# Patient Record
Sex: Male | Born: 1940
Health system: Southern US, Community
[De-identification: ages and names within clinical notes are randomized; demographics above are authoritative.]

## PROBLEM LIST (undated history)

## (undated) DIAGNOSIS — R0602 Shortness of breath: Secondary | ICD-10-CM

## (undated) DIAGNOSIS — I739 Peripheral vascular disease, unspecified: Secondary | ICD-10-CM

## (undated) DIAGNOSIS — E538 Deficiency of other specified B group vitamins: Secondary | ICD-10-CM

## (undated) DIAGNOSIS — I779 Disorder of arteries and arterioles, unspecified: Secondary | ICD-10-CM

## (undated) DIAGNOSIS — D126 Benign neoplasm of colon, unspecified: Secondary | ICD-10-CM

## (undated) DIAGNOSIS — B070 Plantar wart: Secondary | ICD-10-CM

## (undated) DIAGNOSIS — I493 Ventricular premature depolarization: Secondary | ICD-10-CM

## (undated) DIAGNOSIS — E785 Hyperlipidemia, unspecified: Secondary | ICD-10-CM

## (undated) DIAGNOSIS — K760 Fatty (change of) liver, not elsewhere classified: Secondary | ICD-10-CM

## (undated) DIAGNOSIS — N400 Enlarged prostate without lower urinary tract symptoms: Secondary | ICD-10-CM

## (undated) DIAGNOSIS — I77811 Abdominal aortic ectasia: Secondary | ICD-10-CM

## (undated) DIAGNOSIS — Z8639 Personal history of other endocrine, nutritional and metabolic disease: Secondary | ICD-10-CM

## (undated) DIAGNOSIS — N183 Chronic kidney disease, stage 3 unspecified: Secondary | ICD-10-CM

## (undated) DIAGNOSIS — G250 Essential tremor: Secondary | ICD-10-CM

## (undated) DIAGNOSIS — I252 Old myocardial infarction: Secondary | ICD-10-CM

## (undated) DIAGNOSIS — F17200 Nicotine dependence, unspecified, uncomplicated: Secondary | ICD-10-CM

## (undated) DIAGNOSIS — I6529 Occlusion and stenosis of unspecified carotid artery: Secondary | ICD-10-CM

## (undated) DIAGNOSIS — M5136 Other intervertebral disc degeneration, lumbar region: Secondary | ICD-10-CM

## (undated) DIAGNOSIS — I10 Essential (primary) hypertension: Secondary | ICD-10-CM

## (undated) DIAGNOSIS — R7303 Prediabetes: Secondary | ICD-10-CM

## (undated) DIAGNOSIS — I351 Nonrheumatic aortic (valve) insufficiency: Secondary | ICD-10-CM

## (undated) DIAGNOSIS — R911 Solitary pulmonary nodule: Secondary | ICD-10-CM

## (undated) DIAGNOSIS — N182 Chronic kidney disease, stage 2 (mild): Secondary | ICD-10-CM

## (undated) HISTORY — PX: APPENDECTOMY: SHX54

## (undated) HISTORY — PX: TONSILLECTOMY: SUR1361

## (undated) HISTORY — DX: Ventricular premature depolarization: I49.3

## (undated) HISTORY — DX: Chronic kidney disease, stage 2 (mild): N18.2

## (undated) HISTORY — DX: Benign prostatic hyperplasia without lower urinary tract symptoms: N40.0

## (undated) HISTORY — DX: Plantar wart: B07.0

## (undated) HISTORY — PX: UMBILICAL HERNIA REPAIR: SHX196

## (undated) HISTORY — DX: Chronic kidney disease, stage 3 unspecified: N18.30

## (undated) HISTORY — DX: Hyperlipidemia, unspecified: E78.5

## (undated) HISTORY — DX: Disorder of arteries and arterioles, unspecified: I77.9

## (undated) HISTORY — DX: Benign neoplasm of colon, unspecified: D12.6

## (undated) HISTORY — DX: Nonrheumatic aortic (valve) insufficiency: I35.1

## (undated) HISTORY — DX: Deficiency of other specified B group vitamins: E53.8

## (undated) HISTORY — PX: TONSILLECTOMY: SHX5217

## (undated) HISTORY — DX: Essential tremor: G25.0

## (undated) HISTORY — DX: Other intervertebral disc degeneration, lumbar region: M51.36

## (undated) HISTORY — DX: Occlusion and stenosis of unspecified carotid artery: I65.29

## (undated) HISTORY — DX: Fatty (change of) liver, not elsewhere classified: K76.0

## (undated) HISTORY — DX: Solitary pulmonary nodule: R91.1

## (undated) HISTORY — PX: OTHER SURGICAL HISTORY: SHX169

## (undated) HISTORY — DX: Abdominal aortic ectasia: I77.811

## (undated) HISTORY — DX: Peripheral vascular disease, unspecified: I73.9

## (undated) HISTORY — DX: Personal history of other endocrine, nutritional and metabolic disease: Z86.39

## (undated) HISTORY — DX: Old myocardial infarction: I25.2

## (undated) HISTORY — DX: Shortness of breath: R06.02

## (undated) HISTORY — DX: Nicotine dependence, unspecified, uncomplicated: F17.200

## (undated) HISTORY — DX: Essential (primary) hypertension: I10

## (undated) HISTORY — DX: Prediabetes: R73.03

## (undated) HISTORY — PX: COLONOSCOPY W/ POLYPECTOMY: SHX1380

---

## 2002-02-20 ENCOUNTER — Encounter: Payer: Self-pay | Admitting: Family Medicine

## 2002-02-20 ENCOUNTER — Ambulatory Visit (HOSPITAL_COMMUNITY): Admission: RE | Admit: 2002-02-20 | Discharge: 2002-02-20 | Payer: Self-pay | Admitting: Family Medicine

## 2002-04-11 ENCOUNTER — Ambulatory Visit (HOSPITAL_COMMUNITY): Admission: RE | Admit: 2002-04-11 | Discharge: 2002-04-11 | Payer: Self-pay | Admitting: Internal Medicine

## 2002-05-20 ENCOUNTER — Ambulatory Visit (HOSPITAL_COMMUNITY): Admission: RE | Admit: 2002-05-20 | Discharge: 2002-05-20 | Payer: Self-pay | Admitting: Cardiovascular Disease

## 2002-10-31 ENCOUNTER — Emergency Department (HOSPITAL_COMMUNITY): Admission: EM | Admit: 2002-10-31 | Discharge: 2002-11-01 | Payer: Self-pay | Admitting: *Deleted

## 2003-06-01 ENCOUNTER — Ambulatory Visit (HOSPITAL_COMMUNITY): Admission: RE | Admit: 2003-06-01 | Discharge: 2003-06-01 | Payer: Self-pay | Admitting: Internal Medicine

## 2003-08-17 ENCOUNTER — Ambulatory Visit (HOSPITAL_COMMUNITY): Admission: RE | Admit: 2003-08-17 | Discharge: 2003-08-17 | Payer: Self-pay | Admitting: General Surgery

## 2004-09-11 DIAGNOSIS — K760 Fatty (change of) liver, not elsewhere classified: Secondary | ICD-10-CM

## 2004-09-11 HISTORY — DX: Fatty (change of) liver, not elsewhere classified: K76.0

## 2004-09-19 ENCOUNTER — Emergency Department (HOSPITAL_COMMUNITY): Admission: EM | Admit: 2004-09-19 | Discharge: 2004-09-19 | Payer: Self-pay | Admitting: Emergency Medicine

## 2004-11-18 ENCOUNTER — Ambulatory Visit (HOSPITAL_COMMUNITY): Admission: RE | Admit: 2004-11-18 | Discharge: 2004-11-18 | Payer: Self-pay | Admitting: Family Medicine

## 2005-10-24 ENCOUNTER — Ambulatory Visit (HOSPITAL_COMMUNITY): Admission: RE | Admit: 2005-10-24 | Discharge: 2005-10-24 | Payer: Self-pay | Admitting: Urology

## 2006-07-31 ENCOUNTER — Ambulatory Visit (HOSPITAL_COMMUNITY): Admission: RE | Admit: 2006-07-31 | Discharge: 2006-07-31 | Payer: Self-pay | Admitting: Internal Medicine

## 2006-07-31 ENCOUNTER — Ambulatory Visit: Payer: Self-pay | Admitting: Internal Medicine

## 2006-07-31 ENCOUNTER — Encounter (INDEPENDENT_AMBULATORY_CARE_PROVIDER_SITE_OTHER): Payer: Self-pay | Admitting: *Deleted

## 2006-07-31 LAB — HM COLONOSCOPY

## 2007-02-01 ENCOUNTER — Ambulatory Visit (HOSPITAL_COMMUNITY): Admission: RE | Admit: 2007-02-01 | Discharge: 2007-02-01 | Payer: Self-pay | Admitting: Family Medicine

## 2008-11-25 ENCOUNTER — Ambulatory Visit (HOSPITAL_COMMUNITY): Admission: RE | Admit: 2008-11-25 | Discharge: 2008-11-25 | Payer: Self-pay | Admitting: Family Medicine

## 2008-11-25 HISTORY — PX: CAROTID ENDARTERECTOMY: SUR193

## 2008-12-15 ENCOUNTER — Ambulatory Visit: Payer: Self-pay | Admitting: Vascular Surgery

## 2008-12-21 ENCOUNTER — Ambulatory Visit: Payer: Self-pay | Admitting: Vascular Surgery

## 2008-12-23 ENCOUNTER — Encounter: Payer: Self-pay | Admitting: Vascular Surgery

## 2008-12-23 ENCOUNTER — Ambulatory Visit: Payer: Self-pay | Admitting: Vascular Surgery

## 2008-12-23 ENCOUNTER — Inpatient Hospital Stay (HOSPITAL_COMMUNITY): Admission: AD | Admit: 2008-12-23 | Discharge: 2008-12-24 | Payer: Self-pay | Admitting: Vascular Surgery

## 2009-01-12 ENCOUNTER — Ambulatory Visit: Payer: Self-pay | Admitting: Vascular Surgery

## 2009-02-22 ENCOUNTER — Ambulatory Visit (HOSPITAL_COMMUNITY): Admission: RE | Admit: 2009-02-22 | Discharge: 2009-02-22 | Payer: Self-pay | Admitting: Family Medicine

## 2009-03-11 HISTORY — PX: OTHER SURGICAL HISTORY: SHX169

## 2009-08-03 ENCOUNTER — Ambulatory Visit: Payer: Self-pay | Admitting: Vascular Surgery

## 2009-11-26 ENCOUNTER — Ambulatory Visit (HOSPITAL_COMMUNITY): Admission: RE | Admit: 2009-11-26 | Discharge: 2009-11-26 | Payer: Self-pay | Admitting: General Surgery

## 2010-02-08 ENCOUNTER — Ambulatory Visit: Payer: Self-pay | Admitting: Vascular Surgery

## 2010-08-26 ENCOUNTER — Ambulatory Visit (HOSPITAL_COMMUNITY)
Admission: RE | Admit: 2010-08-26 | Discharge: 2010-08-26 | Payer: Self-pay | Source: Home / Self Care | Attending: Family Medicine | Admitting: Family Medicine

## 2010-12-04 LAB — CBC
MCHC: 34.4 g/dL (ref 30.0–36.0)
Platelets: 228 10*3/uL (ref 150–400)
RBC: 5.08 MIL/uL (ref 4.22–5.81)
RDW: 14.2 % (ref 11.5–15.5)

## 2010-12-04 LAB — BASIC METABOLIC PANEL
BUN: 19 mg/dL (ref 6–23)
CO2: 30 mEq/L (ref 19–32)
Calcium: 9.7 mg/dL (ref 8.4–10.5)
Creatinine, Ser: 1.29 mg/dL (ref 0.4–1.5)
GFR calc Af Amer: >60 mL/min (ref >60)
Glucose, Bld: 90 mg/dL (ref 70–99)

## 2010-12-21 LAB — URINALYSIS, ROUTINE W REFLEX MICROSCOPIC
Bilirubin Urine: NEGATIVE
Glucose, UA: NEGATIVE mg/dL
Ketones, ur: NEGATIVE mg/dL
Specific Gravity, Urine: 1.006 (ref 1.005–1.030)
pH: 7.5 (ref 5.0–8.0)

## 2010-12-21 LAB — BASIC METABOLIC PANEL
BUN: 10 mg/dL (ref 6–23)
Calcium: 9.5 mg/dL (ref 8.4–10.5)
Chloride: 99 mEq/L (ref 96–112)
Creatinine, Ser: 1.22 mg/dL (ref 0.4–1.5)
GFR calc Af Amer: >60 mL/min (ref >60)
GFR calc non Af Amer: 60 mL/min — ABNORMAL LOW (ref >60)

## 2010-12-21 LAB — CROSSMATCH: Antibody Screen: NEGATIVE

## 2010-12-21 LAB — CBC
MCV: 88.6 fL (ref 78.0–100.0)
Platelets: 201 10*3/uL (ref 150–400)
RBC: 4.46 MIL/uL (ref 4.22–5.81)
RBC: 4.98 MIL/uL (ref 4.22–5.81)
WBC: 16.2 10*3/uL — ABNORMAL HIGH (ref 4.0–10.5)
WBC: 7.8 10*3/uL (ref 4.0–10.5)

## 2010-12-21 LAB — COMPREHENSIVE METABOLIC PANEL
ALT: 15 U/L (ref 0–53)
AST: 21 U/L (ref 0–37)
Albumin: 3.8 g/dL (ref 3.5–5.2)
CO2: 29 mEq/L (ref 19–32)
Chloride: 102 mEq/L (ref 96–112)
GFR calc Af Amer: >60 mL/min (ref >60)
GFR calc non Af Amer: >60 mL/min (ref >60)
Sodium: 137 mEq/L (ref 135–145)
Total Bilirubin: 0.6 mg/dL (ref 0.3–1.2)

## 2010-12-21 LAB — ABO/RH: ABO/RH(D): O POS

## 2010-12-22 LAB — CREATININE, SERUM
Creatinine, Ser: 1.14 mg/dL (ref 0.4–1.5)
GFR calc Af Amer: >60 mL/min (ref >60)
GFR calc non Af Amer: >60 mL/min (ref >60)

## 2011-01-02 ENCOUNTER — Other Ambulatory Visit (HOSPITAL_COMMUNITY): Payer: Self-pay | Admitting: Family Medicine

## 2011-01-02 ENCOUNTER — Ambulatory Visit (HOSPITAL_COMMUNITY)
Admission: RE | Admit: 2011-01-02 | Discharge: 2011-01-02 | Disposition: A | Discharge status: Home / Self Care | Payer: Medicare Other | Source: Ambulatory Visit | Attending: Family Medicine | Admitting: Family Medicine

## 2011-01-02 DIAGNOSIS — M549 Dorsalgia, unspecified: Secondary | ICD-10-CM | POA: Insufficient documentation

## 2011-01-02 DIAGNOSIS — R1900 Intra-abdominal and pelvic swelling, mass and lump, unspecified site: Secondary | ICD-10-CM | POA: Insufficient documentation

## 2011-01-24 ENCOUNTER — Other Ambulatory Visit (INDEPENDENT_AMBULATORY_CARE_PROVIDER_SITE_OTHER): Payer: Medicare Other

## 2011-01-24 DIAGNOSIS — I6529 Occlusion and stenosis of unspecified carotid artery: Secondary | ICD-10-CM

## 2011-01-24 DIAGNOSIS — Z48812 Encounter for surgical aftercare following surgery on the circulatory system: Secondary | ICD-10-CM

## 2011-01-24 NOTE — Procedures (Signed)
CAROTID DUPLEX EXAM   INDICATION:  Follow up known carotid artery disease.   HISTORY:  Diabetes:  No.  Cardiac:  No.  Hypertension:  Yes.  Smoking:  Yes.  Previous Surgery:  CV History:  Amaurosis Fugax No, Paresthesias No, Hemiparesis No.                                       RIGHT             LEFT  Brachial systolic pressure:         130               120  Brachial Doppler waveforms:         Biphasic          Biphasic  Vertebral direction of flow:        Antegrade         Atypical  DUPLEX VELOCITIES (cm/sec)  CCA peak systolic                   132               113  ECA peak systolic                   171               198  ICA peak systolic                   144               499  ICA end diastolic                   41                169  PLAQUE MORPHOLOGY:                  Heterogenous      Heterogenous  PLAQUE AMOUNT:                      Moderate          Severe  PLAQUE LOCATION:                    ICA, ECA          ICA, ECA   IMPRESSION:  1. 80-99% stenosis noted in the left internal carotid artery.  2. 40-59% stenosis noted in the right internal carotid artery.  3. Antegrade right vertebral artery.  4. Left vertebral artery seemed to have atypical waveform.   ___________________________________________  Quita Skye Hart Rochester, M.D.   MG/MEDQ  D:  12/15/2008  T:  12/15/2008  Job:  [id number]

## 2011-01-24 NOTE — H&P (Signed)
HISTORY AND PHYSICAL EXAMINATION   December 21, 2008   Re:  Jeffrey [name], Jeffrey [name]                 DOB:  01-Mar-1941   CHIEF COMPLAINT:  Severe left internal carotid stenosis - asymptomatic.   HISTORY OF PRESENT ILLNESS:  A 67-year male patient was found have a  carotid bruit by Dr. Nobie Putnam and carotid duplex exam at [facility] in March of this year revealed an 80%+ left internal carotid  stenosis.  Repeat study in [facility] office on April 12 revealed a 90-95% left  internal carotid stenosis and 40-50% right internal carotid stenosis.  He has no history of stroke, TIAs, amaurosis fugax, diplopia, blurred  vision, syncope.  He is now scheduled for elective left carotid  endarterectomy.   PAST MEDICAL HISTORY:  1. Hypertension.  2. Negative for diabetes, coronary artery disease, COPD, stroke or      hyperlipidemia.   PAST SURGERIES:  Appendectomy.  The patient does have a knot in his left  upper arm which will be evaluated with an open biopsy at [facility] in the near future.   FAMILY HISTORY:  Positive for coronary artery disease, stroke and  diabetes in his mother.   SOCIAL HISTORY:  Married, has two children and is retired.  He has a 50+  pack year history of smoking one pack per day.  Does not use alcohol.   REVIEW OF SYSTEMS:  Negative for chest pain, dyspnea on exertion, PND,  orthopnea, anorexia, weight loss.  No cardiac, pulmonary symptoms.  Does  have urinary frequency and occasional headaches, otherwise unremarkable.   ALLERGIES:  None known.   MEDICATIONS:  1. Hydrochlorothiazide 25 mg daily.  2. Lotrel 10-20 mg daily.  3. Flomax 0.4 mg daily.  4. Prilosec 20 mg daily.  5. Goody Powder p.r.n.   PHYSICAL EXAM:  Vital signs:  Blood pressure is 150/96, heart rate 72,  respirations 14.  General: A middle-aged male in no apparent distress, alert and oriented  x3.  Neck:  Supple, 3+ carotid pulses palpable.  There are audible bruits  bilaterally left more harsh than the right.  Neurologic:  Exam normal.  No palpable adenopathy in the neck.  Chest:  Clear to auscultation.  Cardiovascular:  Regular rhythm.  No murmurs.  Abdomen:  Soft, nontender with no masses.  Femoral pulses are 3+ and  popliteal pulses are 3+, posterior tibial pulses 3+ bilaterally.   IMPRESSIONS:  1. Severe left internal carotid stenosis - asymptomatic.  2. Hypertension.   PLAN:  Is to admit the patient on April 14 for elective left carotid  endarterectomy.  Risks and benefits have been thoroughly discussed.  The  patient would like to proceed.   Quita Skye Hart Rochester, M.D.  Electronically Signed   JDL/MEDQ  D:  12/21/2008  T:  12/22/2008  Job:  [id number]   cc:   Patrica Duel, M.D.

## 2011-01-24 NOTE — Procedures (Signed)
CAROTID DUPLEX EXAM   INDICATION:  Followup left carotid endarterectomy.   HISTORY:  Diabetes:  no  Cardiac:  no  Hypertension:  yes  Smoking:  yes  Previous Surgery:  Left carotid endarterectomy on 12/23/2008  CV History:  Currently asymptomatic  Amaurosis Fugax No, Paresthesias No, Hemiparesis No                                       RIGHT             LEFT  Brachial systolic pressure:         138               112  Brachial Doppler waveforms:         normal            abnormal  Vertebral direction of flow:        antegrade         bidirectional  DUPLEX VELOCITIES (cm/sec)  CCA peak systolic                   115               119  ECA peak systolic                   116               118  ICA peak systolic                   92                102  ICA end diastolic                   23                28  PLAQUE MORPHOLOGY:                  mixed             homogenous  PLAQUE AMOUNT:                      mild              mild  PLAQUE LOCATION:                    ICA / ECA         Distal CCA   IMPRESSION:  1. 1% to 39% stenosis of the right internal carotid artery.  2. Patent left carotid endarterectomy site with no left internal      carotid artery stenosis.  3. Doppler velocities of the left internal carotid artery are less      than previously recorded when compared to the previous exam on      08/03/2009 with the right internal carotid artery remaining stable.   ___________________________________________  Quita Skye. Hart Rochester, M.D.   CH/MEDQ  D:  02/08/2010  T:  02/08/2010  Job:  [id number]

## 2011-01-24 NOTE — Op Note (Signed)
NAME:  Jeffrey [name], Jeffrey [name] NO.:  [id number]   MEDICAL RECORD NO.:  [id number]          PATIENT TYPE:  INP   LOCATION:  3311                         FACILITY:  [facility]   PHYSICIAN:  Quita Skye. Hart Rochester, M.D.  DATE OF BIRTH:  [birth year]   DATE OF PROCEDURE:  12/23/2008  DATE OF DISCHARGE:                               OPERATIVE REPORT   PREOPERATIVE DIAGNOSIS:  Severe left internal carotid stenosis -  asymptomatic.   POSTOPERATIVE DIAGNOSIS:  Severe left internal carotid stenosis -  asymptomatic.   OPERATION:  Left carotid endarterectomy with Dacron patch angioplasty.   SURGEON:  Quita Skye. Hart Rochester, MD   FIRST ASSISTANT:  Jerold Coombe, PA   ANESTHESIA:  General endotracheal.   BRIEF HISTORY:  This patient was found to have a carotid bruit by Dr.  Nobie Putnam and a carotid duplex exam reveals a 90% left internal carotid  stenosis and 40-50% right internal carotid stenosis.  He had no symptoms  of previous stroke or TIAs and was scheduled for an elective left  carotid endarterectomy.   PROCEDURE:  The patient was taken to the operating room and placed in  the supine position at which time satisfactory general endotracheal  anesthesia was administered.  Left neck was prepped with Betadine scrub  and solution and draped in routine sterile manner.  An incision was made  along the anterior border of the sternocleidomastoid muscle and carried  down through subcutaneous tissue and platysma using Bovie.  Common  facial vein and external jugular veins were ligated with 3-0 silk ties,  divided, exposing the common internal and external carotid arteries.  Care was taken not to injure the vagus or hypoglossal nerves, both of  which were exposed.  There was a calcified atherosclerotic plaque at the  carotid bifurcation extending up the internal carotid artery posteriorly  about 4 cm.  Distal vessel appeared normal.  A #10 shunt was prepared  and the patient was heparinized.   Carotid vessels were occluded with  vascular clamps.  A longitudinal opening made in the common carotid with  15 blade, extended up into internal carotid with Potts scissors to a  point distal to the disease.  Plaque was about 80-90% stenotic in  severity with excellent backbleeding.  A #10 shunt was inserted without  difficulty reestablishing flow in about 2 minutes.  Standard  endarterectomy was then performed using the elevator and Potts scissors  with eversion endarterectomy of the external carotid.  The plaque  feathered off distal internal carotid artery nicely, not requiring any  tacking sutures.  The lumen was thoroughly irrigated with heparin  saline.  All loose debris carefully removed and arteriotomy was closed  with a patch using continuous 6-0 Prolene.  Prior to completion of  closure, shunt was removed after about 30 minutes of shunt time.  Following antegrade and retrograde flushing, closure was completed  reestablishing the flow initially up the external and internal branch.  Carotid was occluded for less than 2 minutes for  removal of shunt.  Protamine was then given to reverse the heparin.  Following adequate  hemostasis, wound was irrigated with saline, closed  in layers with Vicryl in subcuticular fashion.  A sterile dressing was  applied.  The patient was taken to the recovery room in satisfactory  condition.      Quita Skye Hart Rochester, M.D.  Electronically Signed     JDL/MEDQ  D:  12/23/2008  T:  12/24/2008  Job:  [id number]   cc:   Patrica Duel, M.D.

## 2011-01-24 NOTE — Assessment & Plan Note (Signed)
OFFICE VISIT   Jeffrey [name], Jeffrey [name]  DOB:  1940-12-28                                       01/12/2009  ZOXWR#:[id number]   Also send a copy of the discharge summary admission April 14, discharge  April 15.  The patient is status post left carotid endarterectomy for  severe but asymptomatic left internal carotid stenosis.  Surgery  performed on April 14.  He has had an unremarkable postoperative course  with no complications.  No specific complaints.  He has had no  hemiparesis, aphasia, amaurosis fugax, diplopia, blurred vision or  syncope.  He is swallowing well and has no hoarseness.   PHYSICAL EXAMINATION:  On exam today blood pressure 139/90, heart rate  64, respirations 14.  The left neck incision has healed nicely.  Carotid  pulses are 3+ and no audible bruits.  Neurologic exam is normal.   In general he is getting along quite well and continues to take his  aspirin 325 mg tablet each day.  He will be following up with Dr. Elesa Massed  at [facility] regarding a mass on his left shoulder for surgery in the  near future.  Return to see me in 6 months for followup carotid duplex  exam unless he develops any symptoms in the interim.   Quita Skye Hart Rochester, M.D.  Electronically Signed   JDL/MEDQ  D:  01/12/2009  T:  01/13/2009  Job:  [id number]   cc:   Patrica Duel, M.D.  Chrissie Noa Ward

## 2011-01-24 NOTE — Discharge Summary (Signed)
NAME:  Jeffrey [name], Jeffrey [name] NO.:  [id number]   MEDICAL RECORD NO.:  [id number]          PATIENT TYPE:  INP   LOCATION:  3311                         FACILITY:  [facility]   PHYSICIAN:  Quita Skye. Hart Rochester, M.D.  DATE OF BIRTH:  July 25, 1941   DATE OF ADMISSION:  12/23/2008  DATE OF DISCHARGE:  12/24/2008                               DISCHARGE SUMMARY   ADMISSION DIAGNOSIS:  Asymptomatic severe left internal carotid artery  stenosis.   DISCHARGE DIAGNOSES:  1. Asymptomatic severe left internal carotid artery stenosis status      post left carotid endarterectomy.  2. Hypertension.  3. Mild asymptomatic postoperative bradycardia.  4. Ongoing tobacco abuse.  5. History of appendectomy.  6. History of a knot in his left upper arm, which he is planning to      have open biopsy done at [facility].   PROCEDURES:  December 23, 2008, left carotid endarterectomy and Dacron  patch angioplasty by Dr. Josephina Gip.   BRIEF HISTORY:  Jeffrey [name] is a 70 year old male who was found to have a  carotid bruit by Dr. Nobie Putnam and carotid duplex exam at [facility] in March revealed 80% plus left internal carotid artery  stenosis.  Repeat study in the [facility] office on April 12 revealed 90-95%  left internal carotid artery stenosis and 40-50% right internal carotid  artery stenosis.  He was asymptomatic.  Dr. Hart Rochester recommended a left  carotid endarterectomy to reduce his risk for future stroke.   HOSPITAL COURSE:  Jeffrey [name] was electively admitted to [facility] on December 23, 2008, and he underwent the previously mentioned  procedure.  He was extubated neurologically intact and after short stay  in recovery unit, was transferred to Step-Down Unit 3300 where he  remained until discharge.  At the time of this dictation, he has had an  uneventful postoperative course.  He has been hemodynamically stable.  He has been mildly bradycardic with the heart rate in 50s but  has been  asymptomatic.  His incision is clean, dry, and intact without evidence  of hematoma.  He denies dysphagia.  Diet was advanced, arterial line was  discontinued, and mobility increased.  He is also able to void without  difficulty.  His postoperative labs showed a sodium of 137, potassium  3.6, glucose of 135 with baseline nonfasting glucose of 105, BUN of 10,  creatinine 1.22, white count of 16.2, which is felt most likely  reactive, hemoglobin of 13.5, hematocrit of 39.5, platelet count of 201.  His baseline WBC was 7.8 and he had a negative urinalysis.  Preoperatively, he had a chest x-ray at [facility] and report  shows no acute cardiopulmonary disease with evidence of atherosclerosis.   DISPOSITION:  Jeffrey [name] is deemed appropriate for discharge home on  postop day #1 December 24, 2008, in stable condition.   DISCHARGE MEDICATIONS:  1. Percocet 5/325 mg 1-2 tablets p.o. q.4 h. p.r.n. pain.  2. Enteric-coated aspirin 325 mg p.o. daily.  3. Hydrochlorothiazide 25 mg daily.  4. Lotrel 10/20 mg daily.  5. Flomax 0.4 mg p.o. daily.  6. Prilosec 20 mg p.o. daily.   DISCHARGE INSTRUCTIONS:  He will continue heart-healthy diet.  May  shower and clean incision gently with soap and water.  Avoid driving or  heavy lifting for the next 2 weeks.  See Dr. Hart Rochester in 2-3 weeks.  He  should call sooner if his fever greater than 101, redness, drainage from  the incision site, severe headache, or neurologic changes.      Jerold Coombe, P.A.      Quita Skye Hart Rochester, M.D.  Electronically Signed    AWZ/MEDQ  D:  12/24/2008  T:  12/25/2008  Job:  [id number]   cc:   Quita Skye. Hart Rochester, M.D.  Patrica Duel, M.D.

## 2011-01-24 NOTE — Procedures (Signed)
CAROTID DUPLEX EXAM   INDICATION:  Left carotid endarterectomy.   HISTORY:  Diabetes:  No.  Cardiac:  No.  Hypertension:  Yes.  Smoking:  Yes.  Previous Surgery:  Left carotid endarterectomy on 12/23/08.  CV History:  Currently asymptomatic.  Amaurosis Fugax No, Paresthesias No, Hemiparesis No.                                       RIGHT             LEFT  Brachial systolic pressure:         132               120  Brachial Doppler waveforms:         Normal            Normal  Vertebral direction of flow:        Antegrade         Bidirectional  DUPLEX VELOCITIES (cm/sec)  CCA peak systolic                   107               135  ECA peak systolic                   135               166  ICA peak systolic                   103               158  ICA end diastolic                   32                24  PLAQUE MORPHOLOGY:                  Mixed  PLAQUE AMOUNT:                      Mild              None  PLAQUE LOCATION:                    ICA/ECA   IMPRESSION:  1. 1-39% stenosis of the right internal carotid artery.  2. Patent left carotid endarterectomy site with no left internal      carotid artery stenosis.  Increased velocities of the left proximal      internal carotid artery are most likely due to turbulent distal      common carotid artery flow.  3. Improvement of the bilateral internal carotid artery velocities      noted when compared to the previous examination on 12/15/08.   ___________________________________________  Quita Skye. Hart Rochester, M.D.   CH/MEDQ  D:  08/04/2009  T:  08/04/2009  Job:  [id number]

## 2011-01-27 NOTE — Op Note (Signed)
NAME:  Jeffrey [name], Jeffrey [name]                           ACCOUNT NO.:  [id number]   MEDICAL RECORD NO.:  [id number]                   PATIENT TYPE:  AMB   LOCATION:  DAY                                  FACILITY:  [facility]   PHYSICIAN:  Lionel December, M.D.                 DATE OF BIRTH:  1941/02/08   DATE OF PROCEDURE:  04/11/2002  DATE OF DISCHARGE:  04/11/2002                                 OPERATIVE REPORT   PROCEDURE:  Total colonoscopy.   INDICATIONS FOR PROCEDURE:  [name] is a 70 year old Caucasian male who  is undergoing screening colonoscopy.  He is deemed to be average risk for  colorectal carcinoma.  The procedure and risks were reviewed with the  patient, and informed consent was obtained.   PREOPERATIVE MEDICATIONS:  Demerol 20 mg IV, Versed 3 mg IV, in divided  dose.   INSTRUMENT:  Olympus video system.   FINDINGS:  Procedure performed in endoscopy suite.  The patient's vital  signs and O2 saturation were monitored during procedure and remained stable.  The patient was placed left lateral position and rectal examination  performed.  No abnormality noted on external or digital exam.  The scope was  placed in the rectum and advanced under direct vision to the sigmoid colon  and beyond.  Preparation was excellent.  The scope was passed to the cecum,  which was identified by the appendiceal orifice and the ileocecal valve.  He  had multiple polyps as follows:   There was a 5 mm flat polyp at the cecum, which was ablated by cold biopsy.  Two more were ablated by cold biopsy from the hepatic flexure.   There were three small polyps that were coagulated.  One was at ascending  colon, another one was at the transverse colon, and one at the sigmoid.  They were five polyps that were snared, one was at descending colon and the  rest were at sigmoid colon.  Two of these were pedunculated.  The largest  one was about 16-17 mm in diameter.  All of these polyps were retrieved for  histologic examination.  Rectal mucosa was normal.  The scope was  retroflexed, examining the rectum showing moderate hemorrhoids below the  dentate line.  Endoscope was straightened and withdrawn.  The patient  tolerated the procedure well.   FINAL DIAGNOSES:  1. Examination performed to the cecum.  2. Multiple colonic polyps.  3. Five polyps were snared.  The largest one was 16-17 mm at sigmoid colon.  4. Three polyps were ablated by cold biopsy.  5. Three were coagulated.  6. External hemorrhoids.    RECOMMENDATIONS:  Standard instructions given.  I will be contacting the  patient with biopsy results and further recommendations.  Presuming all of  these polyps are benign, he will be returning for a surveillance colonoscopy  in three years from now.  Lionel December, M.D.    NR/MEDQ  D:  04/11/2002  T:  04/17/2002  Job:  [id number]   cc:   Jonell Cluck, M.D.

## 2011-01-27 NOTE — H&P (Signed)
NAME:  Jeffrey [name], Jeffrey [name]                           ACCOUNT NO.:  [id number]   MEDICAL RECORD NO.:  [id number]                  PATIENT TYPE:   LOCATION:                                       FACILITY:  [facility]   PHYSICIAN:  Dalia Heading, M.D.               DATE OF BIRTH:  06-27-1941   DATE OF ADMISSION:  DATE OF DISCHARGE:                                HISTORY & PHYSICAL   CHIEF COMPLAINT:  Ganglion cyst, left index finger.   HISTORY OF PRESENT ILLNESS:  The patient is a 70 year old white male who was  referred for evaluation and treatment of a ganglion cyst on his left index  finger.  It has been present for two years, but has recently increased in  size and is causing him some discomfort.  It has been lanced in the past,  but it has recurred.  The patient is right-hand dominant.   PAST MEDICAL HISTORY:  1. Hypertension.  2. Coronary artery disease.   PAST SURGICAL HISTORY:  1. Eye surgery.  2. Appendectomy.  3. Cardiac catheterization.  4. Colonoscopy.   CURRENT MEDICATIONS:  Hydrochlorothiazide, Lotrel, Toprol XL, and Nexium.   ALLERGIES:  No known drug allergies.   REVIEW OF SYSTEMS:  The patient smokes a packs of cigarettes a day.  Denies  any alcohol use.  He denies any recent chest pain, shortness of breath, CVA,  or diabetes mellitus.  Denies any bleeding disorders.   PHYSICAL EXAMINATION:  GENERAL APPEARANCE:  The patient is a well-developed,  well-nourished, white male in no acute distress.  VITAL SIGNS:  He is afebrile and vital signs are stable.  LUNGS:  Clear to auscultation with equal breath sounds bilaterally.  HEART:  Regular rate and rhythm without S3, S4, or murmurs.  EXTREMITIES:  The left index finger with a large ganglion cyst involving the  mid palmar aspect of the finger between the DIP in and PIP joints.   IMPRESSION:  Ganglion cyst, left index finger.   PLAN:  The patient is scheduled for excision of the ganglion cyst, left  index finger, on  August 17, 2003.  The risks and benefits of the procedure,  including bleeding, infection, and recurrence of the cyst were fully  explained to the patient, who gave informed consent.     ___________________________________________                                         Dalia Heading, M.D.   MAJ/MEDQ  D:  08/13/2003  T:  08/13/2003  Job:  [id number]   cc:   [facility]

## 2011-01-27 NOTE — Cardiovascular Report (Signed)
   NAME:  Jeffrey [name], Jeffrey [name]                           ACCOUNT NO.:  [id number]   MEDICAL RECORD NO.:  [id number]                   PATIENT TYPE:  OIB   LOCATION:  2896                                 FACILITY:  [facility]   PHYSICIAN:  Runell Gess, M.D.             DATE OF BIRTH:  02/01/1941   DATE OF PROCEDURE:  05/20/2002  DATE OF DISCHARGE:  05/20/2002                              CARDIAC CATHETERIZATION   PROCEDURE:  Abdominal aortogram/selective left renal artery angiogram.   CLINICAL HISTORY:  The patient is a 70 year old gentleman referred for  evaluation of resistant hypertension.  He is on multiple antihypertensives  including Toprol, Lotrel, and hydrochlorothiazide.  He had a positive renal  duplex study suggesting a significant lesion in the right renal artery.  He  presents now for aortography, selective renal angiography, and potential  endovascular therapy.   DESCRIPTION OF PROCEDURE:  The patient was brought to the 6th floor [facility] suite in the postabsorptive state.  He  was premedicated with p.o. Valium.  His right groin was prepped and shaved  in the usual sterile fashion.  Xylocaine 1%, was used for local anesthesia.  A 6-French sheath was inserted into the right femoral artery using standard  Seldinger technique.  A 5-French tennis racquet catheter fell short.  Right  Judkins were used for a midstream abdominal aortography and selective left  renal artery angiography.  Visipaque dye was used for the entirety of the  case.  Retrograde aortic pressures were monitored during the case.   ANGIOGRAPHIC RESULTS:  1. Right renal artery:  Widely patent.  2. Left renal artery:  Widely patent.  There was an early branch of the     superior pole artery which may contribute to the false positive reading.   IMPRESSION:  False positive renal duplex study with widely patent renals  most likely indicating essential hypertension.   The sheaths  were removed and pressure was held on the groin to achieve  hemostasis.  The patient left the lab in stable condition.  He will be  discharged home later this evening and we will see him back in the office in  two weeks for followup.                                               Runell Gess, M.D.    JJB/MEDQ  D:  05/20/2002  T:  05/21/2002  Job:  [id number]   cc:   Peripheral Vascular Angiographic Suite   [facility]   Jonell Cluck, M.D.  [address]  [place]  [place] [address]  Fax: [phone]

## 2011-01-27 NOTE — Op Note (Signed)
NAME:  [name], Jeffrey PAYEUR                           ACCOUNT NO.:  [id number]   MEDICAL RECORD NO.:  [id number]                   PATIENT TYPE:  AMB   LOCATION:  DAY                                  FACILITY:  [facility]   PHYSICIAN:  Lionel December, M.D.                 DATE OF BIRTH:  1941-02-05   DATE OF PROCEDURE:  DATE OF DISCHARGE:                                 OPERATIVE REPORT   PROCEDURE:  Total colonoscopy.   ENDOSCOPIST:  Lionel December, M.D.   INDICATIONS:  Jeffrey [name] is a 70 year old Caucasian male who underwent  screening colonoscopy in August 2003.  He had multiple polyps.  Five polyps  were snared. One had intramucosal carcinoma in it. Three polyps were ablated  by a cold biopsy and 3 more were coagulated.  Given that he had multiple  colonic polyps, I recommended that he should come back for follow up in a  year.  He remains asymptomatic as far as his GI system is concerned.  The  procedure and risks were reviewed with the patient and informed consent was  obtained.   PREOPERATIVE MEDICATIONS:  Demerol 25 mg IV and Versed 4mg  IV.   FINDINGS:  Procedure performed in endoscopy suite.  The patient's vital  signs and O2 saturation were monitored during the procedure and remained  stable.  The patient was placed in the left lateral recumbent position and  rectal examination was performed.  No abnormality noted on external or  digital exam.   Olympus videoscope was placed in the rectum and advanced under vision into  the sigmoid colon and beyond.  Preparation was satisfactory.  The scope was  into the cecum which was identified by ileocecal valve and appendiceal  stump.  Pictures were taken for the record.  As the scope was withdrawn the  colonic mucosa was carefully examined.  There were 3 small polyps, 2 at the  transverse colon. They were ablated by cold biopsy and submitted in 1  container.  The third polyp was in the proximal sigmoid colon.  This was  also treated in  similar fashion.  The mucosa of the rest of the sigmoid  colon was normal. Rectal mucosa similarly was normal.   The scope was retroflexed to examine the anorectal junction and small-to-  moderate size hemorrhoids were noted below the dentate line.  The endoscope  was straightened and withdrawn.  The patient tolerated the procedure well.   FINAL DIAGNOSES:  1. Examination performed to cecum.  Three small polyps, all of which were     ablated by cold biopsy; 2 in the transverse colon and 1 in the sigmoid     colon.  2. Moderate size external hemorrhoids.    RECOMMENDATIONS:  1. Standard instructions given.  2. I will be contacting the patient with biopsy results.  3. Will plan to bring him back for the next colonoscopy  3 years from now.      ___________________________________________                                            Lionel December, M.D.   NR/MEDQ  D:  06/01/2003  T:  06/01/2003  Job:  [id number]   cc:   Patrica Duel, M.D.  [address]  [place]  [place] [address]  Fax: [phone]

## 2011-01-27 NOTE — Op Note (Signed)
NAME:  [name], Jeffrey                 ACCOUNT NO.:  [id number]   MEDICAL RECORD NO.:  [id number]          PATIENT TYPE:  AMB   LOCATION:  DAY                           FACILITY:  [facility]   PHYSICIAN:  Lionel December, M.D.    DATE OF BIRTH:  [birth year]   DATE OF PROCEDURE:  07/31/2006  DATE OF DISCHARGE:                                 OPERATIVE REPORT   PROCEDURE:  Colonoscopy.   INDICATIONS:  [name] is a 70 year old Caucasian male who has had multiple  polyps on prior two colonoscopies performed in August 2003 and September  2004.  One of the polyps had high-grade dysplasia.  Family history is  negative for colorectal carcinoma.  He is undergoing surveillance  colonoscopy.  Procedure and risks were reviewed with the patient, and  informed consent was obtained.   MEDS FOR CONSCIOUS SEDATION:  Demerol 50 mg IV, Versed 4 mg IV.   FINDINGS:  Procedure performed in endoscopy suite.  The patient's vital  signs and O2 saturation were monitored during procedure and remained stable.  The patient was placed in left lateral position and rectal examination  performed.  No abnormality noted on external or digital exam.  Olympus  videoscope was placed in the rectum and advanced under vision into sigmoid  colon.  She had some stool in the sigmoid colon; and preparation of the  proximal sigmoid colon was excellent.  The scope was passed into the cecum  which was identified by appendiceal orifice and ileocecal valve.  There was  a 5 mm sessile polyp at the cecum which was ablated via cold biopsy.  There  was another sessile polyp at the transverse colon about 6 mm.  This was  elevated with submucosal injection of saline, but never could put a snare  around it.  It was, therefore, biopsied for histology and residual polyp was  coagulated using snare tip.  As the scope was withdrawn, the rest of the  colonic mucosa was carefully examined.  There are no other abnormalities.  Rectal mucosa was normal.  Scope  was retroflexed to examine anorectal  junction which was unremarkable.  The anorectal junction and moderate-sized  hemorrhoids were noted below the dentate line.  Endoscope was straightened  and withdrawn.  The patient tolerated the procedure well.   FINAL DIAGNOSIS:  1. Small sessile polyp ablated via cold biopsy from cecum.  2. Another sessile polyp removed from transverse colon via biopsy and      coagulation.  3. External hemorrhoids.   RECOMMENDATIONS:  Standard instructions given.  I will be contacting the  patient with results of biopsy, and further recommendations.      Lionel December, M.D.  Electronically Signed     NR/MEDQ  D:  07/31/2006  T:  07/31/2006  Job:  [id number]   cc:   Patrica Duel, M.D.  Fax: [phone]

## 2011-01-27 NOTE — Op Note (Signed)
NAME:  Jeffrey [name], Jeffrey [name]                           ACCOUNT NO.:  [id number]   MEDICAL RECORD NO.:  [id number]                   PATIENT TYPE:  AMB   LOCATION:  DAY                                  FACILITY:  [facility]   PHYSICIAN:  Dalia Heading, M.D.               DATE OF BIRTH:  [birth year]   DATE OF PROCEDURE:  08/17/2003  DATE OF DISCHARGE:                                 OPERATIVE REPORT   PREOPERATIVE DIAGNOSIS:  Ganglion cyst, left index finger.   POSTOPERATIVE DIAGNOSIS:  Ganglion cyst, left index finger.   PROCEDURE:  Excision of ganglion cyst, left index finger.   SURGEON:  Dalia Heading, M.D.   ANESTHESIA:  Regional.   INDICATIONS FOR PROCEDURE:  The patient is a 70 year old white male who  presents with an enlarging ganglion cyst of his left index finger.  The  risks and benefits of the procedure were fully explained to the patient who  gave informed consent.   DESCRIPTION OF PROCEDURE:  The patient was placed in the supine position.  A  regional block of the left hand was performed.  The left hand was then  prepped and draped using the usual sterile technique with Betadine.  Surgical site confirmation was performed.   A longitudinal incision was made over the ganglion cyst which was in the mid-  palmar aspect of the left index finger between the DIP and PIP joints.  The  cyst was removed without difficulty.  No bleeding was noted during the time  of the procedure.  The incision line was closed using 5-0 nylon interrupted  sutures.  Bacitracin ointment and dry sterile dressing were applied.   All tape and needle counts were correct at the end of the procedure.  The  patient was transferred to day surgery in stable condition.   COMPLICATIONS:  None.   SPECIMENS:  Cyst, left index finger.   ESTIMATED BLOOD LOSS:  None.      ___________________________________________                                            Dalia Heading, M.D.   MAJ/MEDQ  D:  08/17/2003  T:   08/18/2003  Job:  [id number]   cc:   [facility]

## 2011-02-01 NOTE — Procedures (Unsigned)
CAROTID DUPLEX EXAM  INDICATION:  Left carotid endarterectomy.  HISTORY: Diabetes:  No. Cardiac:  No. Hypertension:  Yes. Smoking:  Yes. Previous Surgery:  Left carotid endarterectomy on 12/23/2008. CV History:  Currently asymptomatic. Amaurosis Fugax No, Paresthesias No, Hemiparesis No.                                      RIGHT             LEFT Brachial systolic pressure:         168               152 Brachial Doppler waveforms:         Normal            Normal Vertebral direction of flow:        Antegrade         Bidirectional DUPLEX VELOCITIES (cm/sec) CCA peak systolic                   89                90 ECA peak systolic                   137               146 ICA peak systolic                   96                94 ICA end diastolic                   19                27 PLAQUE MORPHOLOGY:                  Heterogenous      Heterogenous PLAQUE AMOUNT:                      Mild              Mild PLAQUE LOCATION:                    ICA/ECA           CCA  IMPRESSION: 1. Patent left carotid endarterectomy site. 2. No hemodynamically significant stenosis of the bilateral internal     carotid arteries with plaque formations as described above. 3. Bidirectional left vertebral artery Doppler waveform noted with a     velocity of >200 cm/s noted in the left subclavian artery. 4. No significant change noted when compared to the previous     examination on 02/08/2010.  ___________________________________________ Quita Skye. Hart Rochester, M.D.  CH/MEDQ  D:  01/24/2011  T:  01/24/2011  Job:  [id number]

## 2011-08-16 ENCOUNTER — Encounter (INDEPENDENT_AMBULATORY_CARE_PROVIDER_SITE_OTHER): Payer: Self-pay | Admitting: *Deleted

## 2012-01-24 ENCOUNTER — Encounter: Payer: Self-pay | Admitting: Neurosurgery

## 2012-02-02 ENCOUNTER — Ambulatory Visit: Payer: Medicare Other | Admitting: Neurosurgery

## 2012-02-02 ENCOUNTER — Encounter: Payer: Self-pay | Admitting: Neurosurgery

## 2012-02-02 ENCOUNTER — Other Ambulatory Visit: Payer: Medicare Other

## 2012-02-06 ENCOUNTER — Ambulatory Visit (INDEPENDENT_AMBULATORY_CARE_PROVIDER_SITE_OTHER): Payer: Medicare Other | Admitting: *Deleted

## 2012-02-06 ENCOUNTER — Ambulatory Visit (INDEPENDENT_AMBULATORY_CARE_PROVIDER_SITE_OTHER): Payer: Medicare Other | Admitting: Neurosurgery

## 2012-02-06 ENCOUNTER — Encounter: Payer: Self-pay | Admitting: Neurosurgery

## 2012-02-06 VITALS — BP 148/96 | HR 62 | Resp 14 | Ht 69.0 in | Wt 172.4 lb

## 2012-02-06 DIAGNOSIS — I6529 Occlusion and stenosis of unspecified carotid artery: Secondary | ICD-10-CM

## 2012-02-06 DIAGNOSIS — Z48812 Encounter for surgical aftercare following surgery on the circulatory system: Secondary | ICD-10-CM

## 2012-02-06 NOTE — Progress Notes (Signed)
[facility] HISTORY AND PHYSICAL   CC:  Annual carotid duplex for known stenosis and history of left CEA April 2010 Referring Physician: Hart Rochester  History of Present Illness: 71 year old male patient of Dr. Candie Chroman with a history of a left CEA in April 2010. Since that time the patient's been asymptomatic with no signs or symptoms of CVA, TIA, amaurosis fugax or any neural deficit. Patient reports no new medical diagnoses or no recent surgeries. The patient does state that he is going to make a serious effort to quit smoking after 50 years.  Past Medical History  Diagnosis Date  . Carotid artery occlusion   . Hypertension   . Chronic kidney disease   . CAD (coronary artery disease)     ROS: [x]  Positive   [ ]  Denies    General: [ ]  Weight loss, [ ]  Fever, [ ]  chills Neurologic: [ ]  Dizziness, [ ]  Blackouts, [ ]  Seizure [ ]  Stroke, [ ]  "Mini stroke", [ ]  Slurred speech, [ ]  Temporary blindness; [ ]  weakness in arms or legs, [ ]  Hoarseness Cardiac: [ ]  Chest pain/pressure, [ ]  Shortness of breath at rest [ ]  Shortness of breath with exertion, [ ]  Atrial fibrillation or irregular heartbeat Vascular: [ ]  Pain in legs with walking, [ ]  Pain in legs at rest, [ ]  Pain in legs at night,  [ ]  Non-healing ulcer, [ ]  Blood clot in vein/DVT,   Pulmonary: [ ]  Home oxygen, [ ]  Productive cough, [ ]  Coughing up blood, [ ]  Asthma,  [ ]  Wheezing Musculoskeletal:  [ ]  Arthritis, [ ]  Low back pain, [ ]  Joint pain Hematologic: [ ]  Easy Bruising, [ ]  Anemia; [ ]  Hepatitis Gastrointestinal: [ ]  Blood in stool, [ ]  Gastroesophageal Reflux/heartburn, [ ]  Trouble swallowing Urinary: [ ]  chronic Kidney disease, [ ]  on HD - [ ]  MWF or [ ]  TTHS, [ ]  Burning with urination, [ ]  Difficulty urinating Skin: [ ]  Rashes, [ ]  Wounds Psychological: [ ]  Anxiety, [ ]  Depression   Social History History  Substance Use Topics  . Smoking status: Current Everyday Smoker -- 1.0 packs/day for  50 years    Types: Cigarettes  . Smokeless tobacco: Not on file  . Alcohol Use: No    Family History Family History  Problem Relation Age of Onset  . Coronary artery disease Mother   . Stroke Mother   . Diabetes Mother     Not on File  Current Outpatient Prescriptions  Medication Sig Dispense Refill  . amLODipine-benazepril (LOTREL) 10-20 MG per capsule Take 1 capsule by mouth daily.      Marland Kitchen aspirin 325 MG tablet Take 81 mg by mouth daily.       . hydrochlorothiazide (HYDRODIURIL) 25 MG tablet Take 25 mg by mouth daily.      Marland Kitchen omeprazole (PRILOSEC) 20 MG capsule Take 20 mg by mouth daily.      . Tamsulosin HCl (FLOMAX PO) Take by mouth.        Physical Examination  Filed Vitals:   02/06/12 1442  BP: 148/96  Pulse: 62  Resp: 14    Body mass index is 25.46 kg/(m^2).  General:  WDWN in NAD Gait: Normal HEENT: WNL Eyes: Pupils equal Pulmonary: normal non-labored breathing , without Rales, rhonchi,  wheezing Cardiac: RRR, without  Murmurs, rubs or gallops; Abdomen: soft, NT, no masses Skin: no rashes, ulcers noted  Vascular Exam Pulses: 2+ radial pulses bilaterally Carotid bruits:  3+ carotid pulses to auscultation no bruits are heard Extremities without ischemic changes, no Gangrene , no cellulitis; no open wounds;  Musculoskeletal: no muscle wasting or atrophy   Neurologic: A&O X 3; Appropriate Affect ; SENSATION: normal; MOTOR FUNCTION:  moving all extremities equally. Speech is fluent/normal  Non-Invasive Vascular Imaging CAROTID DUPLEX 02/06/2012  Right ICA 40 - 59 % stenosis Left ICA 0 - 19% stenosis   ASSESSMENT/PLAN: Asymptomatic patient status post left CEA 3 years ago, the plan is for him to followup here in one year with repeat carotid duplex and be seen in my clinic, his questions were encouraged and answered. Patient knows signs and symptoms of CVA and knows to report to the nearest emergency room should this occur.  Lauree Chandler ANP   Clinic MD:  Hart Rochester

## 2012-02-07 NOTE — Progress Notes (Signed)
Addended by: Sharee Pimple on: 02/07/2012 08:52 AM   Modules accepted: Orders

## 2012-02-12 NOTE — Procedures (Unsigned)
CAROTID DUPLEX EXAM  INDICATION:  Follow up left CEA.  HISTORY: Diabetes:  No. Cardiac:  No. Hypertension:  Yes. Smoking:  Yes. Previous Surgery:  Left CEA 12/23/2008. CV History: Amaurosis Fugax No, Paresthesias No, Hemiparesis No                                      RIGHT                   LEFT Brachial systolic pressure:         152                     152 Brachial Doppler waveforms:         WNL                     WNL Vertebral direction of flow:        Abnormal antegrade      To-fro DUPLEX VELOCITIES (cm/sec) CCA peak systolic                   76                      100 ECA peak systolic                   120                     140 ICA peak systolic                   139                     91 ICA end diastolic                   41                      21 PLAQUE MORPHOLOGY:                  Heterogeneous Heterogeneous PLAQUE AMOUNT:                      Mild to moderate        Mild PLAQUE LOCATION:                    CCA/ECA/ICA/subclavian CCA/subclavian  IMPRESSION: 1. Widely patent left carotid endarterectomy without evidence of     hyperplasia or re-stenosis. 2. Right internal carotid artery disease progression; now 40-59%     stenosis. 3. Bilateral vertebral arteries are abnormal, left worse than right. 4. Bilateral subclavian stenosis is observed with velocities of 259     cm/s on the right and 221 cm/s on the left  ___________________________________________ Quita Skye. Hart Rochester, M.D.  LT/MEDQ  D:  02/06/2012  T:  02/06/2012  Job:  [id number]

## 2012-02-27 ENCOUNTER — Other Ambulatory Visit (HOSPITAL_COMMUNITY): Payer: Self-pay | Admitting: Family Medicine

## 2012-02-27 DIAGNOSIS — E785 Hyperlipidemia, unspecified: Secondary | ICD-10-CM

## 2012-02-27 DIAGNOSIS — I10 Essential (primary) hypertension: Secondary | ICD-10-CM

## 2012-02-29 ENCOUNTER — Other Ambulatory Visit (HOSPITAL_COMMUNITY): Payer: Medicare Other

## 2012-03-11 HISTORY — PX: TRANSTHORACIC ECHOCARDIOGRAM: SHX275

## 2012-12-16 ENCOUNTER — Ambulatory Visit (INDEPENDENT_AMBULATORY_CARE_PROVIDER_SITE_OTHER): Payer: Medicare PPO | Admitting: Family Medicine

## 2012-12-16 ENCOUNTER — Encounter: Payer: Self-pay | Admitting: Family Medicine

## 2012-12-16 VITALS — BP 146/78 | HR 75 | Temp 98.6°F | Ht 69.0 in | Wt 167.0 lb

## 2012-12-16 DIAGNOSIS — E785 Hyperlipidemia, unspecified: Secondary | ICD-10-CM

## 2012-12-16 DIAGNOSIS — Z86010 Personal history of colonic polyps: Secondary | ICD-10-CM

## 2012-12-16 DIAGNOSIS — I10 Essential (primary) hypertension: Secondary | ICD-10-CM

## 2012-12-16 DIAGNOSIS — B070 Plantar wart: Secondary | ICD-10-CM

## 2012-12-16 LAB — COMPREHENSIVE METABOLIC PANEL
BUN: 13 mg/dL (ref 6–23)
CO2: 30 mEq/L (ref 19–32)
Calcium: 9.3 mg/dL (ref 8.4–10.5)
Chloride: 104 mEq/L (ref 96–112)
Creatinine, Ser: 1.1 mg/dL (ref 0.4–1.5)
GFR: 70.4 mL/min (ref >60.00)
Glucose, Bld: 98 mg/dL (ref 70–99)

## 2012-12-16 LAB — LIPID PANEL
Cholesterol: 173 mg/dL (ref 0–200)
HDL: 37.9 mg/dL — ABNORMAL LOW (ref >39.00)
Triglycerides: 79 mg/dL (ref 0.0–149.0)

## 2012-12-16 LAB — CBC WITH DIFFERENTIAL/PLATELET
Eosinophils Relative: 3.2 % (ref 0.0–5.0)
HCT: 39.3 % (ref 39.0–52.0)
Hemoglobin: 13.4 g/dL (ref 13.0–17.0)
Lymphs Abs: 1.2 10*3/uL (ref 0.7–4.0)
MCV: 88.2 fl (ref 78.0–100.0)
Monocytes Relative: 8.8 % (ref 3.0–12.0)
Neutro Abs: 5.9 10*3/uL (ref 1.4–7.7)
WBC: 8.1 10*3/uL (ref 4.5–10.5)

## 2012-12-21 ENCOUNTER — Encounter: Payer: Self-pay | Admitting: Family Medicine

## 2012-12-21 DIAGNOSIS — Z860101 Personal history of adenomatous and serrated colon polyps: Secondary | ICD-10-CM | POA: Insufficient documentation

## 2012-12-21 DIAGNOSIS — B070 Plantar wart: Secondary | ICD-10-CM | POA: Insufficient documentation

## 2012-12-21 DIAGNOSIS — Z86010 Personal history of colonic polyps: Secondary | ICD-10-CM | POA: Insufficient documentation

## 2012-12-21 HISTORY — DX: Plantar wart: B07.0

## 2012-12-21 NOTE — Assessment & Plan Note (Signed)
Due for repeat colonoscopy this year, apparently missed appt for this recently. He will call Dr. Patty Sermons office to reschedule this.

## 2012-12-21 NOTE — Progress Notes (Signed)
Office Note 12/21/2012  CC:  Chief Complaint  Patient presents with  . Establish Care    fasitng labs    HPI:  Jeffrey [name] is a 72 y.o. White male who is here to establish care. Patient's most recent primary MD: Dr. Nobie Putnam and others at [facility] in [place], [place]. Old records in [facility] EMR were reviewed prior to or during today's visit.  Discussed medical history: pretty well controlled HTN and hyperlipidemia per his report. Has a painful, hard spot on bottom of left foot near base of great toe, present for months or more, pain only intermittent depending on how he steps on it.  No redness, no wound.  Past Medical History  Diagnosis Date  . Carotid artery occlusion   . Hypertension   . Chronic kidney disease     No RAS  . Hyperlipidemia   . Adenomatous colon polyp     Dr. Karilyn Cota (28 polyps on 1st endo, 3 on 2nd endo a year later.  He is due for repeat endoscopy as of 12/2012  . BPH (benign prostatic hyperplasia)     Dr. Laurance Flatten (pt is asymptomatic)    Past Surgical History  Procedure Laterality Date  . Carotid endarterectomy  11/25/08    Left     ICA  . Appendectomy  remote  . Arm surgery  July 2010    Left arm cyst/Lipoma  . Umbilical hernia repair    . Tonsillectomy  remote    Family History  Problem Relation Age of Onset  . Coronary artery disease Mother   . Stroke Mother   . Diabetes Mother     History   Social History  . Marital Status: Married    Spouse Name: N/A    Number of Children: N/A  . Years of Education: N/A   Occupational History  . Not on file.   Social History Main Topics  . Smoking status: Current Every Day Smoker -- 1.00 packs/day for 50 years    Types: Cigarettes  . Smokeless tobacco: Never Used  . Alcohol Use: No  . Drug Use: No  . Sexually Active: Not on file   Other Topics Concern  . Not on file   Social History Narrative   Married, 2 children.   Orig from [place].   Retired from [employer].   Current smoker; 1 ppd (x 50 yrs).  No hx alc, no drugs.   Active person, no formal exercise.   MEDS: Valsartan/hctz 160/12.5 qd, atorvastatin 80mg  qd, ASA 81mg  qd  No Known Allergies  ROS Review of Systems  Constitutional: Negative for fever and fatigue.  HENT: Negative for congestion and sore throat.   Eyes: Negative for visual disturbance.  Respiratory: Negative for cough.   Cardiovascular: Negative for chest pain.  Gastrointestinal: Negative for nausea and abdominal pain.  Genitourinary: Negative for dysuria.  Musculoskeletal: Negative for back pain and joint swelling.  Skin: Negative for rash.  Neurological: Negative for weakness and headaches.  Hematological: Negative for adenopathy.  Psychiatric/Behavioral: Negative for dysphoric mood.    PE; Blood pressure 146/78, pulse 75, temperature 98.6 F (37 C), temperature source Temporal, height 5\' 9"  (1.753 m), weight 167 lb (75.751 kg), SpO2 97.00%. Gen: Alert, well appearing.  Patient is oriented to person, place, time, and situation. ENT: Ears: EACs clear, normal epithelium.  TMs with good light reflex and landmarks bilaterally.  Eyes: no injection, icteris, swelling, or exudate.  EOMI, PERRLA. Nose: no drainage or turbinate edema/swelling.  No injection or focal lesion.  Mouth: lips without lesion/swelling.  CV: RRR, no m/r/g.   LUNGS: CTA bilat, nonlabored resps, good aeration in all lung fields. EXT: no clubbing, cyanosis, or edema.  Plantar surface of left foot with 3 cm diameter callused area of skin with a verrucous lesion visible in the center.  Mild TTP over center of this, without erythema.  Pertinent labs:  None today  ASSESSMENT AND PLAN:   New Pt; obtain old records.  Plantar wart of left foot This had significant callus around/on top of it so I shave this down today to expose the wart. It felt much better after this and I did not do any further intervention for his wart today.  History of  adenomatous polyp of colon Due for repeat colonoscopy this year, apparently missed appt for this recently. He will call Dr. Patty Sermons office to reschedule this.  He is fasting today so we did his general health maintenance labs today.  An After Visit Summary was printed and given to the patient.  Return for pt to return at his earliest convenience to get CPE (not fasting).

## 2012-12-21 NOTE — Assessment & Plan Note (Signed)
This had significant callus around/on top of it so I shave this down today to expose the wart. It felt much better after this and I did not do any further intervention for his wart today.

## 2012-12-23 ENCOUNTER — Encounter: Payer: Self-pay | Admitting: Family Medicine

## 2012-12-23 ENCOUNTER — Ambulatory Visit (INDEPENDENT_AMBULATORY_CARE_PROVIDER_SITE_OTHER): Payer: Medicare PPO | Admitting: Family Medicine

## 2012-12-23 VITALS — BP 122/74 | HR 72 | Temp 97.6°F | Ht 69.0 in | Wt 166.8 lb

## 2012-12-23 DIAGNOSIS — Z0389 Encounter for observation for other suspected diseases and conditions ruled out: Secondary | ICD-10-CM

## 2012-12-23 DIAGNOSIS — Z0000 Encounter for general adult medical examination without abnormal findings: Secondary | ICD-10-CM | POA: Insufficient documentation

## 2012-12-23 DIAGNOSIS — Z125 Encounter for screening for malignant neoplasm of prostate: Secondary | ICD-10-CM

## 2012-12-23 LAB — PSA, MEDICARE: PSA: 0.85 ng/ml (ref 0.10–4.00)

## 2012-12-23 NOTE — Assessment & Plan Note (Addendum)
Reviewed age and gender appropriate health maintenance issues (prudent diet, regular exercise, health risks of tobacco and excessive alcohol, use of seatbelts, fire alarms in home, use of sunscreen).  Also reviewed age and gender appropriate health screening as well as vaccine recommendations.  Awaiting old PCP records to see when last Tdap was given. He'll contact his GI to reschedule recently missed f/u colonoscopy. Will do PSA today.   DRE normal today. He'll add low chol/low fat diet to current atorv 80mg  qd.  Repeat FLP in 88mo, o/v the following week.

## 2012-12-23 NOTE — Progress Notes (Signed)
Office Note 12/23/2012  CC:  Chief Complaint  Patient presents with  . Annual Exam    HPI:  Jeffrey [name] is a 72 y.o. White male who is here for CPE.  I saw him for the first time last week and shave a callus off his foot and he says this feels better. Reviewed fasting labs done last week: all were normal except LDL 119, HDL 38.   He does not eat a low chol/low fat diet but says he is willing to start.  He is on max dosing of atorvastatin. He is still smoking, trying to cut back slowly. Unclear whether he has been getting regular f/u with urologist, Dr. Laurance Flatten.  I was under the impression he was getting annual prostate checks/PSAs through him but we called Dr. Keitha Butte office today and last labs were done 2007, last o/v was 2012.  Will do PSA screening with DRE today.  Complains of ED x many years, thinks it might be connected to bp med but has been on many over the years due to lack of control and ED hasn't changed.  He recently finally got good bp control on current med.  Says he saw urologist in [place] for ED and ED meds no help, offered penile pump but pt declined.  Sexual desire is intact   Past Medical History  Diagnosis Date  . Carotid artery occlusion   . Hypertension   . Chronic kidney disease     No RAS  . Hyperlipidemia   . Adenomatous colon polyp     Dr. Karilyn Cota (28 polyps on 1st endo, 3 on 2nd endo a year later.  He is due for repeat endoscopy as of 12/2012  . BPH (benign prostatic hyperplasia)     Dr. Laurance Flatten (pt is asymptomatic)    Past Surgical History  Procedure Laterality Date  . Carotid endarterectomy  11/25/08    Left     ICA  . Appendectomy  remote  . Arm surgery  July 2010    Left arm cyst/Lipoma  . Umbilical hernia repair    . Tonsillectomy  remote    Family History  Problem Relation Age of Onset  . Coronary artery disease Mother   . Stroke Mother   . Diabetes Mother     History   Social History  . Marital Status: Married    Spouse Name: N/A     Number of Children: N/A  . Years of Education: N/A   Occupational History  . Not on file.   Social History Main Topics  . Smoking status: Current Every Day Smoker -- 1.00 packs/day for 50 years    Types: Cigarettes  . Smokeless tobacco: Never Used  . Alcohol Use: No  . Drug Use: No  . Sexually Active: Not on file   Other Topics Concern  . Not on file   Social History Narrative   Married, 2 children.   Orig from [place].   Retired from [employer].   Current smoker; 1 ppd (x 50 yrs).  No hx alc, no drugs.   Active person, no formal exercise.    Outpatient Prescriptions Prior to Visit  Medication Sig Dispense Refill  . atorvastatin (LIPITOR) 80 MG tablet Take 1 tablet by mouth daily.      Marland Kitchen omeprazole (PRILOSEC) 20 MG capsule Take 20 mg by mouth daily.      . valsartan-hydrochlorothiazide (DIOVAN-HCT) 160-12.5 MG per tablet Take 1 tablet by mouth daily.      Marland Kitchen  Tamsulosin HCl (FLOMAX PO) Take by mouth.       No facility-administered medications prior to visit.    No Known Allergies  ROS Review of Systems  Constitutional: Negative for fever, chills, appetite change and fatigue.  HENT: Negative for ear pain, congestion, sore throat, neck stiffness and dental problem.   Eyes: Negative for discharge, redness and visual disturbance.  Respiratory: Negative for cough, chest tightness, shortness of breath and wheezing.   Cardiovascular: Negative for chest pain, palpitations and leg swelling.  Gastrointestinal: Negative for nausea, vomiting, abdominal pain, diarrhea and blood in stool.  Genitourinary: Negative for dysuria, urgency, frequency, hematuria, flank pain and difficulty urinating.  Musculoskeletal: Negative for myalgias, back pain, joint swelling and arthralgias.  Skin: Negative for pallor and rash.  Neurological: Negative for dizziness, speech difficulty, weakness and headaches.  Hematological: Negative for adenopathy. Does not bruise/bleed easily.   Psychiatric/Behavioral: Negative for confusion and sleep disturbance. The patient is not nervous/anxious.     PE; Blood pressure 122/74, pulse 72, temperature 97.6 F (36.4 C), temperature source Oral, height 5\' 9"  (1.753 m), weight 166 lb 12 oz (75.637 kg), SpO2 96.00%. Gen: Alert, well appearing.  Patient is oriented to person, place, time, and situation. AFFECT: pleasant, lucid thought and speech. ENT: Ears: EACs clear, normal epithelium.  TMs with good light reflex and landmarks bilaterally.  Eyes: no injection, icteris, swelling, or exudate.  EOMI, PERRLA. Nose: no drainage or turbinate edema/swelling.  No injection or focal lesion.  Mouth: lips without lesion/swelling.  Oral mucosa pink and moist.  Dentition intact and without obvious caries or gingival swelling.  Oropharynx without erythema, exudate, or swelling.  Neck: supple/nontender.  No LAD, mass, or TM.  Carotid pulses 2+ on left, 1+ on right, faint bruit vs transmitted cardiac murmur heard on left. CV: RRR, no m/r/g.   LUNGS: CTA bilat, nonlabored resps, good aeration in all lung fields. ABD: soft, NT, ND, BS normal.  No hepatospenomegaly or mass.  No bruits. EXT: no clubbing, cyanosis, or edema.  Musculoskeletal: no joint swelling, erythema, warmth, or tenderness.  ROM of all joints intact. Skin - no sores or suspicious lesions or rashes or color changes Genitals normal; both testes normal without tenderness, masses, hydroceles, varicoceles, erythema or swelling. Shaft normal, uncircumcised, meatus normal without discharge. No inguinal hernia noted. No inguinal lymphadenopathy. Rectal exam: negative without mass, lesions or tenderness, PROSTATE EXAM: smooth and symmetric without nodules or tenderness.  Pertinent labs:  Lab Results  Component Value Date   TSH 1.18 12/16/2012   Lab Results  Component Value Date   WBC 8.1 12/16/2012   HGB 13.4 12/16/2012   HCT 39.3 12/16/2012   MCV 88.2 12/16/2012   PLT 199.0 12/16/2012   Lab  Results  Component Value Date   CREATININE 1.1 12/16/2012   BUN 13 12/16/2012   NA 140 12/16/2012   K 5.2* 12/16/2012   CL 104 12/16/2012   CO2 30 12/16/2012   Lab Results  Component Value Date   ALT 14 12/16/2012   AST 17 12/16/2012   ALKPHOS 79 12/16/2012   BILITOT 0.5 12/16/2012   Lab Results  Component Value Date   CHOL 173 12/16/2012   Lab Results  Component Value Date   HDL 37.90* 12/16/2012   Lab Results  Component Value Date   LDLCALC 119* 12/16/2012   Lab Results  Component Value Date   TRIG 79.0 12/16/2012   Lab Results  Component Value Date   CHOLHDL 5 12/16/2012   No  results found for this basename: PSA    ASSESSMENT AND PLAN:   Health maintenance examination Reviewed age and gender appropriate health maintenance issues (prudent diet, regular exercise, health risks of tobacco and excessive alcohol, use of seatbelts, fire alarms in home, use of sunscreen).  Also reviewed age and gender appropriate health screening as well as vaccine recommendations. He'll contact his GI to reschedule recently missed f/u colonoscopy. Will do PSA today.   DRE normal today. He'll add low chol/low fat diet to current atorv 80mg  qd.  Repeat FLP in 40mo, o/v the following week.  An After Visit Summary was printed and given to the patient.  FOLLOW UP:  Return in about 6 months (around 06/24/2013) for f/u hyperlipidemia; fasting lipid panel and BMET the week prior.

## 2013-01-06 ENCOUNTER — Other Ambulatory Visit (INDEPENDENT_AMBULATORY_CARE_PROVIDER_SITE_OTHER): Payer: Medicare PPO | Admitting: Vascular Surgery

## 2013-01-06 DIAGNOSIS — Z48812 Encounter for surgical aftercare following surgery on the circulatory system: Secondary | ICD-10-CM

## 2013-01-06 DIAGNOSIS — I6529 Occlusion and stenosis of unspecified carotid artery: Secondary | ICD-10-CM

## 2013-01-08 ENCOUNTER — Other Ambulatory Visit: Payer: Self-pay | Admitting: *Deleted

## 2013-01-08 DIAGNOSIS — Z48812 Encounter for surgical aftercare following surgery on the circulatory system: Secondary | ICD-10-CM

## 2013-01-09 ENCOUNTER — Encounter: Payer: Self-pay | Admitting: Vascular Surgery

## 2013-02-05 ENCOUNTER — Ambulatory Visit: Payer: Medicare Other | Admitting: Neurosurgery

## 2013-02-05 ENCOUNTER — Other Ambulatory Visit: Payer: Medicare Other

## 2013-02-09 DIAGNOSIS — M5136 Other intervertebral disc degeneration, lumbar region: Secondary | ICD-10-CM

## 2013-02-09 DIAGNOSIS — M51369 Other intervertebral disc degeneration, lumbar region without mention of lumbar back pain or lower extremity pain: Secondary | ICD-10-CM

## 2013-02-09 HISTORY — DX: Other intervertebral disc degeneration, lumbar region: M51.36

## 2013-02-09 HISTORY — DX: Other intervertebral disc degeneration, lumbar region without mention of lumbar back pain or lower extremity pain: M51.369

## 2013-02-10 ENCOUNTER — Ambulatory Visit (INDEPENDENT_AMBULATORY_CARE_PROVIDER_SITE_OTHER): Payer: Medicare PPO | Admitting: Family Medicine

## 2013-02-10 ENCOUNTER — Encounter: Payer: Self-pay | Admitting: Family Medicine

## 2013-02-10 VITALS — BP 192/97 | HR 71 | Temp 97.7°F | Resp 14 | Wt 165.0 lb

## 2013-02-10 DIAGNOSIS — M79609 Pain in unspecified limb: Secondary | ICD-10-CM

## 2013-02-10 DIAGNOSIS — I10 Essential (primary) hypertension: Secondary | ICD-10-CM

## 2013-02-10 DIAGNOSIS — M79605 Pain in left leg: Secondary | ICD-10-CM | POA: Insufficient documentation

## 2013-02-10 MED ORDER — HYDROCODONE-ACETAMINOPHEN 5-325 MG PO TABS: ORAL_TABLET | ORAL | Status: DC

## 2013-02-10 MED ORDER — VALSARTAN 160 MG PO TABS: 160.0000 mg | ORAL_TABLET | Freq: Every day | ORAL | Status: DC

## 2013-02-10 NOTE — Assessment & Plan Note (Signed)
Poor control. Hypotension when taking valsartan 160/HCTZ 25 daily. D/c this and start valsartan 160mg  qd generic. Monitor bp discussed and bring list in 1 mo.  Parameters for call-back were reviewed with pt.

## 2013-02-10 NOTE — Patient Instructions (Addendum)
Take one celebrex 200mg  capsule once daily with food (#12 sample caps).  Check blood pressure and heart rate once a day and bring log book of these numbers to next f/u appt in 1 mo.  If bp consistently > 160 on top or > 100 on bottom (2 days in a row) then call or make earlier office f/u visit.

## 2013-02-10 NOTE — Assessment & Plan Note (Signed)
Sciatica vs hamstring+glut strain. Discussed celebrex 200 mg cap qd (#12 samples given today) with food. Vicodin 5/325, 1-2 q6h prn, #30, no RF. Heat application and stretching discussed.

## 2013-02-10 NOTE — Progress Notes (Signed)
OFFICE NOTE  02/16/2013  CC:  Chief Complaint  Patient presents with  . Leg Pain    Pt c/o Right hip & leg pain that radiates down from hip [post cutitng & lifting wood] x3 days.    HPI: Patient is a 72 y.o. Caucasian male who is here for left leg pain.   Onset 4 d/a after he had spent a couple of days helping a friend split wood and carry it, repeated bending at waist and heavy lifting.  Hurts in left buttocks region, radiates down into left calf region intermittently.  Intensity 2/10 when lying in bed curled up in a ball, goes up to 10/10 when he stands up and when he walks.  Bending over alleviates the pain somewhat.  Has never had similar pain before.  Feels a bit improved today.  Bowel and bladder function intact, no paresthesias.  No meds or heat or ice have been tried.    Also, some bps have been down into 90/40 range and he has felt lightheaded and weak during these times.   He is feeling no chest pains or DOE or palpitations. As a result of low bps he has been taking his valsartan/hctz qod or even less.  Pertinent PMH:  Past Medical History  Diagnosis Date  . Carotid artery occlusion   . Hypertension   . Chronic kidney disease     No RAS  . Hyperlipidemia   . Adenomatous colon polyp     Dr. Karilyn Cota (28 polyps on 1st endo, 3 on 2nd endo a year later.  He is due for repeat endoscopy as of 12/2012  . BPH (benign prostatic hyperplasia)     Dr. Laurance Flatten (pt is asymptomatic)  . Plantar wart of left foot 12/21/2012   Past Surgical History  Procedure Laterality Date  . Carotid endarterectomy  11/25/08    Left     ICA  . Appendectomy  remote  . Arm surgery  July 2010    Left arm cyst/Lipoma  . Umbilical hernia repair    . Tonsillectomy  remote   Past family and social history reviewed and there are no changes since the patient's last office visit with me.  MEDS:  Outpatient Prescriptions Prior to Visit  Medication Sig Dispense Refill  . atorvastatin (LIPITOR) 80 MG tablet Take  1 tablet by mouth daily.      Marland Kitchen omeprazole (PRILOSEC) 20 MG capsule Take 20 mg by mouth daily.      . Tamsulosin HCl (FLOMAX PO) Take by mouth.      . valsartan-hydrochlorothiazide (DIOVAN-HCT) 160-12.5 MG per tablet Take 1 tablet by mouth daily.       No facility-administered medications prior to visit.    PE: Blood pressure 192/97, pulse 71, temperature 97.7 F (36.5 C), temperature source Temporal, resp. rate 14, weight 165 lb (74.844 kg), SpO2 98.00%. Gen: Alert, well appearing.  Patient is oriented to person, place, time, and situation. CV: RRR, no m/r/g.   LUNGS: CTA bilat, nonlabored resps, good aeration in all lung fields. EXT: no clubbing, cyanosis, or edema.  BACK: nontender L/S spine.  Question of mild TTP in left sciatic notch region.  SLR neg bilat.  DTRs 1+ and symmetric in LE's.  LE strength 5/5 bilat.     IMPRESSION AND PLAN: Left leg pain Sciatica vs hamstring+glut strain. Discussed celebrex 200 mg cap qd (#12 samples given today) with food. Vicodin 5/325, 1-2 q6h prn, #30, no RF. Heat application and stretching discussed.  Essential hypertension,  benign Poor control. Hypotension when taking valsartan 160/HCTZ 25 daily. D/c this and start valsartan 160mg  qd generic. Monitor bp discussed and bring list in 1 mo.  Parameters for call-back were reviewed with pt.   An After Visit Summary was printed and given to the patient.  FOLLOW UP: 28mo

## 2013-02-13 ENCOUNTER — Telehealth: Payer: Self-pay | Admitting: *Deleted

## 2013-02-13 DIAGNOSIS — M5432 Sciatica, left side: Secondary | ICD-10-CM

## 2013-02-13 DIAGNOSIS — M79609 Pain in unspecified limb: Secondary | ICD-10-CM

## 2013-02-13 MED ORDER — PREDNISONE 20 MG PO TABS: 40.0000 mg | ORAL_TABLET | Freq: Every day | ORAL | Status: DC

## 2013-02-13 NOTE — Telephone Encounter (Signed)
Caller reports that patient has followed provider instructions [Celebrex, Vicodin, Heat & stretching] without any relief for Left leg pain; request plan of action [OV, Xray, PT]. Also, patient still having elevated BP: 06.03-174/79; 06.04-170/75; 06.05-178/93 after changing of Rx; request plan of action for this matter/SLS Please advise.  Per VO, PHM: 1) Stop Celebrex; Rx for Prednisone 20 mg Sig: Take [2] tablets once daily for [5] days to pharmacy 2) Physical therapy referral/order [facility] rehab Ok?] 3) Increase Valsartan to [2] 160 mg tablets once daily if BP still >160 on top [systolic] or >100 on bottom [diastolic], if BP <409/811, then just continue [1] valsartan tablet daily.  Patient's spouse informed, understood & agreed; Ok for Pt referral to [facility] rehab/SLS

## 2013-02-14 NOTE — Telephone Encounter (Signed)
[facility] rehab PT ordered.

## 2013-02-16 ENCOUNTER — Encounter: Payer: Self-pay | Admitting: Family Medicine

## 2013-02-17 ENCOUNTER — Ambulatory Visit (HOSPITAL_COMMUNITY)
Admission: RE | Admit: 2013-02-17 | Discharge: 2013-02-17 | Disposition: A | Discharge status: Home / Self Care | Payer: Medicare PPO | Source: Ambulatory Visit | Attending: Family Medicine | Admitting: Family Medicine

## 2013-02-17 DIAGNOSIS — M51379 Other intervertebral disc degeneration, lumbosacral region without mention of lumbar back pain or lower extremity pain: Secondary | ICD-10-CM | POA: Insufficient documentation

## 2013-02-17 DIAGNOSIS — M25559 Pain in unspecified hip: Secondary | ICD-10-CM | POA: Insufficient documentation

## 2013-02-17 DIAGNOSIS — M5432 Sciatica, left side: Secondary | ICD-10-CM

## 2013-02-17 DIAGNOSIS — M5430 Sciatica, unspecified side: Secondary | ICD-10-CM | POA: Insufficient documentation

## 2013-02-17 DIAGNOSIS — M79609 Pain in unspecified limb: Secondary | ICD-10-CM

## 2013-02-17 DIAGNOSIS — M5137 Other intervertebral disc degeneration, lumbosacral region: Secondary | ICD-10-CM | POA: Insufficient documentation

## 2013-02-17 MED ORDER — OXYCODONE-ACETAMINOPHEN 5-325 MG PO TABS: ORAL_TABLET | ORAL | Status: DC

## 2013-02-17 NOTE — Telephone Encounter (Signed)
Patient's spouse reports that pt's leg pain is no better w/pain medication [Hydrocodone-APAP, finished yesterday] and Prednisone and believes this to be driving his BP up [16.10.96: 185/94; 144/85; 116/66 & 06.09.14: 154/79]; requesting to have Xray order for leg pain/SLS Please advise.

## 2013-02-17 NOTE — Telephone Encounter (Signed)
Pt's spouse informed; will p/u Rx tomorrow morning after 8a; provider gave Xray results to caller/SLS

## 2013-02-17 NOTE — Telephone Encounter (Signed)
Pt's spouse informed, understood & agreed; orders entered for Xray at [facility]/SLS No impairment on Hydrocodone, just subtherapeutic; please advise on new Rx for pain.

## 2013-02-17 NOTE — Telephone Encounter (Signed)
Will order low back and left hip x-rays--[facility] ok with them? Also, ask if the vicodin is making him drowsy or impaired at all.  If not, we can try a stronger pain med to get him more comfortable (and help the bp elevations as a result).  Let me know-thx

## 2013-02-17 NOTE — Telephone Encounter (Signed)
Percocet rx printed. 

## 2013-02-18 ENCOUNTER — Telehealth: Payer: Self-pay | Admitting: Family Medicine

## 2013-02-18 DIAGNOSIS — M25552 Pain in left hip: Secondary | ICD-10-CM

## 2013-02-18 DIAGNOSIS — M79609 Pain in unspecified limb: Secondary | ICD-10-CM

## 2013-02-18 DIAGNOSIS — M79605 Pain in left leg: Secondary | ICD-10-CM

## 2013-02-18 NOTE — Telephone Encounter (Signed)
Rx waiting at front desk for patient pick-up.

## 2013-02-18 NOTE — Telephone Encounter (Signed)
Nancy/Spouse, Phone [phone].  Reported office staff told her the Rx for pain medication was ready for pick up but pharmacy had no RX.  Called to verify if she needed to come to the office to pick up the Rx.  Per Epic, confirmed Rx was printed and ready for pick up at the office.  Explained certain narcotics have to be picked up at office since pharmacy requires a written prescription; it cannot be called in.  Will be coming to office later this morning to pick it up.

## 2013-02-20 NOTE — Addendum Note (Signed)
Addended by: Regis Bill on: 02/20/2013 11:48 AM   Modules accepted: Orders

## 2013-02-20 NOTE — Telephone Encounter (Addendum)
LMOM with contact name and number for return call RE: patient's hip pain and further provider instructions, recommends referral to Orthopaedics/SLS

## 2013-02-20 NOTE — Telephone Encounter (Signed)
Patient's daughter called in. Stated patient's hip is no better. What does Dr. Milinda Cave recommend? Would a cortisone injection help?  Please call Orlene Och back.

## 2013-02-20 NOTE — Telephone Encounter (Addendum)
Spoke with patient's spouse and gave her provider recommendation for referral to Orthopaedic, explained that we could get pt in [place] location sooner than [facility], but it was completely their choice; Ok to set-up referral in [place]; referral order placed/SLS

## 2013-02-26 ENCOUNTER — Other Ambulatory Visit: Payer: Self-pay | Admitting: Internal Medicine

## 2013-02-26 DIAGNOSIS — M545 Low back pain: Secondary | ICD-10-CM

## 2013-02-27 ENCOUNTER — Other Ambulatory Visit: Payer: Self-pay | Admitting: Family Medicine

## 2013-02-27 DIAGNOSIS — M545 Low back pain: Secondary | ICD-10-CM

## 2013-02-27 MED ORDER — VALSARTAN 160 MG PO TABS: 320.0000 mg | ORAL_TABLET | Freq: Every day | ORAL | Status: DC

## 2013-02-27 NOTE — Telephone Encounter (Signed)
Patients wife, Harriett Sine call and said that pt ran out of BP medication and pharmacy won't refill.  She states that he should be taking two daily but I didn't see that in your note.  Please advise.

## 2013-02-27 NOTE — Telephone Encounter (Signed)
Yes, I recently increased his dose to 2 of the 160mg  valsartan tabs daily--it is documented in a telephone note that was done after my most recent office note on him. I sent in a new eRx to reflect this dosing.  Pls notify pt that this should be ready at pharmacy.-thx

## 2013-02-28 NOTE — Telephone Encounter (Signed)
Patients wife aware

## 2013-03-02 ENCOUNTER — Ambulatory Visit
Admission: RE | Admit: 2013-03-02 | Discharge: 2013-03-02 | Disposition: A | Discharge status: Home / Self Care | Payer: Medicare PPO | Source: Ambulatory Visit | Attending: Internal Medicine | Admitting: Internal Medicine

## 2013-03-02 DIAGNOSIS — M545 Low back pain: Secondary | ICD-10-CM

## 2013-03-04 ENCOUNTER — Telehealth (HOSPITAL_COMMUNITY): Payer: Self-pay

## 2013-03-08 ENCOUNTER — Emergency Department (HOSPITAL_COMMUNITY): Payer: Medicare PPO

## 2013-03-08 ENCOUNTER — Emergency Department (HOSPITAL_COMMUNITY)
Admission: EM | Admit: 2013-03-08 | Discharge: 2013-03-08 | Disposition: A | Discharge status: Home / Self Care | Payer: Medicare PPO | Attending: Emergency Medicine | Admitting: Emergency Medicine

## 2013-03-08 ENCOUNTER — Encounter (HOSPITAL_COMMUNITY): Payer: Self-pay | Admitting: *Deleted

## 2013-03-08 DIAGNOSIS — Z8679 Personal history of other diseases of the circulatory system: Secondary | ICD-10-CM | POA: Insufficient documentation

## 2013-03-08 DIAGNOSIS — Z8639 Personal history of other endocrine, nutritional and metabolic disease: Secondary | ICD-10-CM | POA: Insufficient documentation

## 2013-03-08 DIAGNOSIS — Z860100 Personal history of colon polyps, unspecified: Secondary | ICD-10-CM | POA: Insufficient documentation

## 2013-03-08 DIAGNOSIS — Z7982 Long term (current) use of aspirin: Secondary | ICD-10-CM | POA: Insufficient documentation

## 2013-03-08 DIAGNOSIS — Z87448 Personal history of other diseases of urinary system: Secondary | ICD-10-CM | POA: Insufficient documentation

## 2013-03-08 DIAGNOSIS — M549 Dorsalgia, unspecified: Secondary | ICD-10-CM | POA: Insufficient documentation

## 2013-03-08 DIAGNOSIS — R110 Nausea: Secondary | ICD-10-CM | POA: Insufficient documentation

## 2013-03-08 DIAGNOSIS — N189 Chronic kidney disease, unspecified: Secondary | ICD-10-CM | POA: Insufficient documentation

## 2013-03-08 DIAGNOSIS — R0602 Shortness of breath: Secondary | ICD-10-CM | POA: Insufficient documentation

## 2013-03-08 DIAGNOSIS — Z872 Personal history of diseases of the skin and subcutaneous tissue: Secondary | ICD-10-CM | POA: Insufficient documentation

## 2013-03-08 DIAGNOSIS — F17200 Nicotine dependence, unspecified, uncomplicated: Secondary | ICD-10-CM | POA: Insufficient documentation

## 2013-03-08 DIAGNOSIS — R911 Solitary pulmonary nodule: Secondary | ICD-10-CM | POA: Insufficient documentation

## 2013-03-08 DIAGNOSIS — R509 Fever, unspecified: Secondary | ICD-10-CM | POA: Insufficient documentation

## 2013-03-08 DIAGNOSIS — H538 Other visual disturbances: Secondary | ICD-10-CM | POA: Insufficient documentation

## 2013-03-08 DIAGNOSIS — I129 Hypertensive chronic kidney disease with stage 1 through stage 4 chronic kidney disease, or unspecified chronic kidney disease: Secondary | ICD-10-CM | POA: Insufficient documentation

## 2013-03-08 DIAGNOSIS — I10 Essential (primary) hypertension: Secondary | ICD-10-CM

## 2013-03-08 DIAGNOSIS — Z862 Personal history of diseases of the blood and blood-forming organs and certain disorders involving the immune mechanism: Secondary | ICD-10-CM | POA: Insufficient documentation

## 2013-03-08 LAB — CBC WITH DIFFERENTIAL/PLATELET
Basophils Relative: 1 % (ref 0–1)
HCT: 41.2 % (ref 39.0–52.0)
Hemoglobin: 14.3 g/dL (ref 13.0–17.0)
Lymphocytes Relative: 13 % (ref 12–46)
Lymphs Abs: 1 10*3/uL (ref 0.7–4.0)
MCHC: 34.7 g/dL (ref 30.0–36.0)
Monocytes Absolute: 0.5 10*3/uL (ref 0.1–1.0)
Monocytes Relative: 6 % (ref 3–12)
Neutro Abs: 6.3 10*3/uL (ref 1.7–7.7)
RBC: 4.72 MIL/uL (ref 4.22–5.81)

## 2013-03-08 LAB — TROPONIN I: Troponin I: <0.3 ng/mL (ref <0.30)

## 2013-03-08 LAB — COMPREHENSIVE METABOLIC PANEL
Alkaline Phosphatase: 89 U/L (ref 39–117)
BUN: 11 mg/dL (ref 6–23)
CO2: 30 mEq/L (ref 19–32)
Chloride: 100 mEq/L (ref 96–112)
Creatinine, Ser: 1.22 mg/dL (ref 0.50–1.35)
GFR calc non Af Amer: 59 mL/min — ABNORMAL LOW (ref >90)
Glucose, Bld: 114 mg/dL — ABNORMAL HIGH (ref 70–99)
Potassium: 4.1 mEq/L (ref 3.5–5.1)
Total Bilirubin: 0.2 mg/dL — ABNORMAL LOW (ref 0.3–1.2)

## 2013-03-08 LAB — URINALYSIS, ROUTINE W REFLEX MICROSCOPIC
Glucose, UA: NEGATIVE mg/dL
Hgb urine dipstick: NEGATIVE
Ketones, ur: NEGATIVE mg/dL
Protein, ur: NEGATIVE mg/dL
pH: 8 (ref 5.0–8.0)

## 2013-03-08 LAB — LIPASE, BLOOD: Lipase: 24 U/L (ref 11–59)

## 2013-03-08 MED ORDER — IOHEXOL 350 MG/ML SOLN
100.0000 mL | Freq: Once | INTRAVENOUS | Status: AC | PRN
  Administered 2013-03-08: 100 mL via INTRAVENOUS

## 2013-03-08 MED ORDER — ONDANSETRON 4 MG PO TBDP
4.0000 mg | ORAL_TABLET | Freq: Once | ORAL | Status: AC
  Administered 2013-03-08: 4 mg via ORAL
  Filled 2013-03-08: qty 1

## 2013-03-08 NOTE — ED Provider Notes (Signed)
History    This chart was scribed for Shelda Jakes, MD by Leone Payor, ED Scribe. This patient was seen in room APA06/AP[facility]06 and the patient's care was started 7:16 AM.  CSN: [id number] Arrival date & time 03/08/13  4098  First MD Initiated Contact with Patient 03/08/13 0710     Chief Complaint  Patient presents with  . Hypertension    The history is provided by the patient. No language interpreter was used.    HPI Comments: Jeffrey [name] is a 72 y.o. male who presents to the Emergency Department complaining of hypertension starting early this morning at 0200. Pt also had associated blurry vision, SOB, nausea at that time. Pt had hernia repair and takes oxycodone for the pain. Pt states he checked his BP this morning and had a reading of 200/88. He has taken two 160mg  of Diovan about 30 min ago. States he has tried different doses and types of BP medication but doesn't believe his BP has been properly controlled. He denies any pain other than his leg pain which he has at baseline.     Past Medical History  Diagnosis Date  . Carotid artery occlusion   . Hypertension   . Chronic kidney disease     No RAS  . Hyperlipidemia   . Adenomatous colon polyp     Dr. Karilyn Cota (28 polyps on 1st endo, 3 on 2nd endo a year later.  He is due for repeat endoscopy as of 12/2012  . BPH (benign prostatic hyperplasia)     Dr. Laurance Flatten (pt is asymptomatic)  . Plantar wart of left foot 12/21/2012   Past Surgical History  Procedure Laterality Date  . Carotid endarterectomy  11/25/08    Left     ICA  . Appendectomy  remote  . Arm surgery  July 2010    Left arm cyst/Lipoma  . Umbilical hernia repair    . Tonsillectomy  remote   Family History  Problem Relation Age of Onset  . Coronary artery disease Mother   . Stroke Mother   . Diabetes Mother    History  Substance Use Topics  . Smoking status: Current Every Day Smoker -- 1.00 packs/day for 50 years    Types: Cigarettes  . Smokeless  tobacco: Never Used  . Alcohol Use: No    Review of Systems  Constitutional: Positive for fever and chills.  HENT: Negative for congestion, sore throat and rhinorrhea.   Eyes: Positive for visual disturbance.  Respiratory: Positive for shortness of breath. Negative for cough.   Cardiovascular: Negative for chest pain and leg swelling.  Gastrointestinal: Positive for nausea. Negative for vomiting, abdominal pain and diarrhea.  Genitourinary: Negative for dysuria and hematuria.  Musculoskeletal: Positive for back pain. Negative for joint swelling.  Skin: Negative for rash.  Neurological: Negative for headaches.  Hematological: Does not bruise/bleed easily.  Psychiatric/Behavioral: Negative for confusion.    Allergies  Review of patient's allergies indicates no known allergies.  Home Medications   Current Outpatient Rx  Name  Route  Sig  Dispense  Refill  . aspirin 81 MG chewable tablet   Oral   Chew 81 mg by mouth daily.         Marland Kitchen omeprazole (PRILOSEC) 20 MG capsule   Oral   Take 20 mg by mouth daily as needed (heartburn).          Marland Kitchen oxyCODONE-acetaminophen (PERCOCET/ROXICET) 5-325 MG per tablet   Oral   Take 1-2 tablets by  mouth every 6 (six) hours as needed for pain.         . valsartan (DIOVAN) 160 MG tablet   Oral   Take 2 tablets (320 mg total) by mouth daily.   60 tablet   6    BP 186/86  Pulse 68  Temp(Src) 98.4 F (36.9 C) (Oral)  Resp 17  Ht 5\' 9"  (1.753 m)  Wt 165 lb (74.844 kg)  BMI 24.36 kg/m2  SpO2 99% Physical Exam  Nursing note and vitals reviewed. Constitutional: He is oriented to person, place, and time. He appears well-developed and well-nourished. No distress.  HENT:  Head: Normocephalic and atraumatic.  Eyes: EOM are normal.  Neck: Neck supple. No tracheal deviation present.  Cardiovascular: Normal rate, regular rhythm and normal heart sounds.   No murmur heard. Pulses:      Radial pulses are 2+ on the right side, and 2+ on the  left side.  Pulmonary/Chest: Effort normal and breath sounds normal. No respiratory distress. He has no wheezes. He has no rales.  Abdominal: Soft. Bowel sounds are normal. There is no tenderness.  Musculoskeletal: Normal range of motion. He exhibits no edema.  No swelling in ankles.   Neurological: He is alert and oriented to person, place, and time.  Skin: Skin is warm and dry.  Psychiatric: He has a normal mood and affect. His behavior is normal.    ED Course  Procedures (including critical care time)  DIAGNOSTIC STUDIES: Oxygen Saturation is 99% on RA, normal by my interpretation.    COORDINATION OF CARE: 7:36 AM Discussed treatment plan with pt at bedside and pt agreed to plan.   9:24 AM Upon recheck, pt's BP is 130/66 currently.   Labs Reviewed  CBC WITH DIFFERENTIAL - Abnormal; Notable for the following:    Neutrophils Relative % 78 (*)    All other components within normal limits  COMPREHENSIVE METABOLIC PANEL - Abnormal; Notable for the following:    Glucose, Bld 114 (*)    Total Bilirubin 0.2 (*)    GFR calc non Af Amer 59 (*)    GFR calc Af Amer 68 (*)    All other components within normal limits  PRO B NATRIURETIC PEPTIDE - Abnormal; Notable for the following:    Pro B Natriuretic peptide (BNP) 270.1 (*)    All other components within normal limits  D-DIMER, QUANTITATIVE - Abnormal; Notable for the following:    D-Dimer, Quant 0.61 (*)    All other components within normal limits  LIPASE, BLOOD  TROPONIN I  URINALYSIS, ROUTINE W REFLEX MICROSCOPIC   Dg Chest 2 View  03/08/2013   *RADIOLOGY REPORT*  Clinical Data: Hypertension.  CHEST - 2 VIEW  Comparison: Two-view chest 08/26/2010.  Findings: The heart size is normal.  Mild emphysematous changes are again noted.  The lungs are clear without focal nodule, mass, or airspace disease.  The visualized soft tissues and bony thorax are unremarkable.  IMPRESSION:  1.  No acute cardiopulmonary disease. 2.  Emphysema.    Original Report Authenticated By: Marin Roberts, M.D.   Ct Head Wo Contrast  03/08/2013   *RADIOLOGY REPORT*  Clinical Data: Hypertension.  CT HEAD WITHOUT CONTRAST  Technique:  Contiguous axial images were obtained from the base of the skull through the vertex without contrast.  Comparison: CT head without contrast 09/19/2004.  Findings: No acute cortical infarct, hemorrhage, or mass lesion is present.  Minimal atrophy is within normal limits for age.  The ventricles are  normal size.  No significant extra-axial fluid collection is present.  Mild mucosal thickening is evident in the anterior right ethmoid air cells, inferior right frontal sinus, and medial right maxillary sinus.  Minimal mucosal thickening is present in the sphenoid sinuses bilaterally.  The mastoid air cells are clear.  IMPRESSION:  1.  Normal CT appearance of the brain. 2.  Mild paranasal sinus disease as described, predominately anteriorly on the right.   Original Report Authenticated By: Marin Roberts, M.D.   Ct Angio Chest Pe W/cm &/or Wo Cm  03/08/2013   *RADIOLOGY REPORT*  Clinical Data: Hypertension.  Elevated D-dimer.  CT ANGIOGRAPHY CHEST  Technique:  Multidetector CT imaging of the chest using the standard protocol during bolus administration of intravenous contrast. Multiplanar reconstructed images including MIPs were obtained and reviewed to evaluate the vascular anatomy.  Contrast: OMNIPAQUE IOHEXOL 350 MG/ML SOLN  Comparison: None.  Findings: There are no filling defects in the pulmonary arterial tree to suggest acute pulmonary thromboembolism.  No pericardial effusion.  No abnormal adenopathy.  Atherosclerotic plaque with calcification and irregularity involves the aortic arch and descending thoracic aorta.  There is also plaque at the origin of the left subclavian artery.  Minimal aortic valve calcification.  Atherosclerotic calcifications in the left anterior descending and left main coronary arteries.  7 mm  right upper lobe pulmonary nodule on image 37.  No acute bony deformity.  Degenerative changes in the lower thoracic spine are present.  Images of the upper abdomen demonstrate bilateral adrenal adenomas. There is a nonspecific 3.0 x 1.7 centimeter left adrenal nodule that does not meet Hounsfield criteria for benign adenoma.  There is irregular plaque and chronic mural thrombus in the upper abdominal aorta.  Small hiatal hernia is noted.  IMPRESSION: No evidence of acute pulmonary thromboembolism.  7 mm right upper lobe pulmonary nodule. If the patient is at high risk for bronchogenic carcinoma, follow-up chest CT at 3-6 months is recommended.  If the patient is at low risk for bronchogenic carcinoma, follow-up chest CT at 6-12 months is recommended.  This recommendation follows the consensus statement: Guidelines for Management of Small Pulmonary Nodules Detected on CT Scans: A Statement from the Fleischner Society as published in Radiology 2005; 237:395-400.  Bilateral adrenal nodules.  Two are benign adenomas.  A third is nonspecific.  If the patient is at high risk for malignancy, MRI of the adrenal glands is recommended to further characterize.  If there is no prior history malignancy, it can be presumed benign but 74-month follow-up MRI should be considered.   Original Report Authenticated By: Jolaine Click, M.D.   Results for orders placed during the hospital encounter of 03/08/13  CBC WITH DIFFERENTIAL      Result Value Range   WBC 8.1  4.0 - 10.5 K/uL   RBC 4.72  4.22 - 5.81 MIL/uL   Hemoglobin 14.3  13.0 - 17.0 g/dL   HCT 54.0  98.1 - 19.1 %   MCV 87.3  78.0 - 100.0 fL   MCH 30.3  26.0 - 34.0 pg   MCHC 34.7  30.0 - 36.0 g/dL   RDW 47.8  29.5 - 62.1 %   Platelets 193  150 - 400 K/uL   Neutrophils Relative % 78 (*) 43 - 77 %   Neutro Abs 6.3  1.7 - 7.7 K/uL   Lymphocytes Relative 13  12 - 46 %   Lymphs Abs 1.0  0.7 - 4.0 K/uL   Monocytes Relative 6  3 - 12 %   Monocytes Absolute 0.5  0.1 -  1.0 K/uL   Eosinophils Relative 2  0 - 5 %   Eosinophils Absolute 0.2  0.0 - 0.7 K/uL   Basophils Relative 1  0 - 1 %   Basophils Absolute 0.1  0.0 - 0.1 K/uL  COMPREHENSIVE METABOLIC PANEL      Result Value Range   Sodium 140  135 - 145 mEq/L   Potassium 4.1  3.5 - 5.1 mEq/L   Chloride 100  96 - 112 mEq/L   CO2 30  19 - 32 mEq/L   Glucose, Bld 114 (*) 70 - 99 mg/dL   BUN 11  6 - 23 mg/dL   Creatinine, Ser 1.47  0.50 - 1.35 mg/dL   Calcium 9.8  8.4 - 82.9 mg/dL   Total Protein 7.5  6.0 - 8.3 g/dL   Albumin 3.9  3.5 - 5.2 g/dL   AST 26  0 - 37 U/L   ALT 20  0 - 53 U/L   Alkaline Phosphatase 89  39 - 117 U/L   Total Bilirubin 0.2 (*) 0.3 - 1.2 mg/dL   GFR calc non Af Amer 59 (*) >90 mL/min   GFR calc Af Amer 68 (*) >90 mL/min  LIPASE, BLOOD      Result Value Range   Lipase 24  11 - 59 U/L  TROPONIN I      Result Value Range   Troponin I <0.30  <0.30 ng/mL  PRO B NATRIURETIC PEPTIDE      Result Value Range   Pro B Natriuretic peptide (BNP) 270.1 (*) 0 - 125 pg/mL  D-DIMER, QUANTITATIVE      Result Value Range   D-Dimer, Quant 0.61 (*) 0.00 - 0.48 ug/mL-FEU  URINALYSIS, ROUTINE W REFLEX MICROSCOPIC      Result Value Range   Color, Urine YELLOW  YELLOW   APPearance CLEAR  CLEAR   Specific Gravity, Urine 1.010  1.005 - 1.030   pH 8.0  5.0 - 8.0   Glucose, UA NEGATIVE  NEGATIVE mg/dL   Hgb urine dipstick NEGATIVE  NEGATIVE   Bilirubin Urine NEGATIVE  NEGATIVE   Ketones, ur NEGATIVE  NEGATIVE mg/dL   Protein, ur NEGATIVE  NEGATIVE mg/dL   Urobilinogen, UA 0.2  0.0 - 1.0 mg/dL   Nitrite NEGATIVE  NEGATIVE   Leukocytes, UA NEGATIVE  NEGATIVE       Date: 03/08/2013  Rate: 65  Rhythm: normal sinus rhythm and sinus arrhythmia  QRS Axis: normal  Intervals: normal  ST/T Wave abnormalities: nonspecific ST changes  Conduction Disutrbances:first-degree A-V block   Narrative Interpretation:   Old EKG Reviewed: unchanged EKG is unchanged compared to 12/21/2008    1.  Shortness of breath   2. Hypertension     MDM  Workup without any significant acute findings. CT scan negative for pulmonary embolism or pneumonia. Did find some pulmonary nodules and adrenal nodules that will need followup. Report provided to patient. Explanation for patient's symptoms not clear. The patient's blood pressure did correct itself here without any specific treatment to a normal level. Patient feeling better. Lab workup without any specific abnormalities.  I personally performed the services described in this documentation, which was scribed in my presence. The recorded information has been reviewed and is accurate.          Shelda Jakes, MD 03/08/13 1011

## 2013-03-08 NOTE — ED Notes (Signed)
Patient with no complaints at this time. Respirations even and unlabored. Skin warm/dry. Discharge instructions reviewed with patient at this time. Patient given opportunity to voice concerns/ask questions. IV removed per policy and band-aid applied to site. Patient discharged at this time and left Emergency Department with steady gait.  

## 2013-03-08 NOTE — ED Notes (Addendum)
Pt c/o elevated blood pressure, blurry vision, nausea, sob that he experienced this am around 2:00.  Pt denies any headache, chest pain, denies any weakness on one side vs other. Pt had checked his blood pressure prior to coming to er at home with reading of 202/88 and 200/88. Pt did take two 160mg   Diovan thirty minutes prior to arrival in er.

## 2013-03-12 ENCOUNTER — Ambulatory Visit (INDEPENDENT_AMBULATORY_CARE_PROVIDER_SITE_OTHER): Payer: Medicare PPO | Admitting: Family Medicine

## 2013-03-12 ENCOUNTER — Encounter: Payer: Self-pay | Admitting: Family Medicine

## 2013-03-12 VITALS — BP 144/83 | HR 63 | Temp 98.3°F | Resp 16 | Ht 69.0 in | Wt 162.0 lb

## 2013-03-12 DIAGNOSIS — R0989 Other specified symptoms and signs involving the circulatory and respiratory systems: Secondary | ICD-10-CM

## 2013-03-12 DIAGNOSIS — R911 Solitary pulmonary nodule: Secondary | ICD-10-CM

## 2013-03-12 DIAGNOSIS — M79609 Pain in unspecified limb: Secondary | ICD-10-CM

## 2013-03-12 DIAGNOSIS — D3500 Benign neoplasm of unspecified adrenal gland: Secondary | ICD-10-CM

## 2013-03-12 DIAGNOSIS — I169 Hypertensive crisis, unspecified: Secondary | ICD-10-CM

## 2013-03-12 DIAGNOSIS — I10 Essential (primary) hypertension: Secondary | ICD-10-CM

## 2013-03-12 DIAGNOSIS — M79605 Pain in left leg: Secondary | ICD-10-CM

## 2013-03-12 DIAGNOSIS — F17200 Nicotine dependence, unspecified, uncomplicated: Secondary | ICD-10-CM

## 2013-03-12 NOTE — Progress Notes (Signed)
OFFICE NOTE  03/14/2013  CC:  Chief Complaint  Patient presents with  . Hypertension     HPI: Patient is a 72 y.o. Caucasian male who is here with his wife for 1 mo f/u LB/sciatica pain and f/u poorly controlled HTN. 1) Back--PT beginning to help some lately but he still considers his pain moderate, radiating down left leg. He got an MRI of L-spine on 03/02/13 and it showed lower lumber DDD/spondylosis which is causing some moderate spinal stenosis and foramina narrowing without nerve impingement at several levels, but also shows a more acute-appearing HNP at L4-5 and L5-S1 that is causing left foraminal narrowing with nerve impingement---corresponding pretty well with his recent pain, onset about 3-4 wks ago. Plan is to continue PT for now, f/u with ortho, no intervention was mentioned per pt--will obtain records.  2) HTN--on 2 of the 160mg  diovan once daily his bp numbers are normal for the most part.  However, he has had several episodes in which he begins to feel a HA, gets very sweaty, has mild blurry vision and nausea, and feels short of breath. Denies chest pain, palpitations, dizziness.  BP during these episodes is around 200/100, HR 75-85.  Severe symptoms generally last about 30 minutes and then things subside over the next 1-2 hours until he feels normal again. He recently went to the ED on 03/08/13 for his most recent episode (I reviewed his entire ED records from this visit today), where his bp was 186/86,HR 68.  His symptoms subsided and his bp normalized without medication intervention.  He had a head CT which was negative, CT angio of chest which showed no PE but did demonstrate a 7 mm RUL nodule which needs a repeat CT for f/u in 3-6 mo due to his high risk for lung cancer. Additionally, this CT showed bilateral adrenal adenomas (total of 3, 2 of which appeared benign but the 3rd is nonspecific). A chest x-ray showed emphysema but NAD.  Urinalysis was normal and BMET was normal (cr  at baseline of 1.2).   CBC w/diff was normal, lipase normal, TnI normal.  Pro-BNP slightly elevated at 270 and D-dimer slightly elevated at 0.61.     Pertinent PMH:  Past Medical History  Diagnosis Date  . Carotid artery occlusion   . Hypertension   . Chronic kidney disease     No RAS  . Hyperlipidemia   . Adenomatous colon polyp     Dr. Karilyn Cota (28 polyps on 1st endo, 3 on 2nd endo a year later.  He is due for repeat endoscopy as of 12/2012  . BPH (benign prostatic hyperplasia)     Dr. Laurance Flatten (pt is asymptomatic)  . Plantar wart of left foot 12/21/2012   Past surgical, social, and family history reviewed and no changes noted since last office visit.  MEDS:  Outpatient Prescriptions Prior to Visit  Medication Sig Dispense Refill  . aspirin 81 MG chewable tablet Chew 81 mg by mouth daily.      Marland Kitchen omeprazole (PRILOSEC) 20 MG capsule Take 20 mg by mouth daily as needed (heartburn).       Marland Kitchen oxyCODONE-acetaminophen (PERCOCET/ROXICET) 5-325 MG per tablet Take 1-2 tablets by mouth every 6 (six) hours as needed for pain.      . valsartan (DIOVAN) 160 MG tablet Take 2 tablets (320 mg total) by mouth daily.  60 tablet  6   No facility-administered medications prior to visit.    PE: Blood pressure 144/83, pulse 63, temperature 98.3  F (36.8 C), temperature source Oral, resp. rate 16, height 5\' 9"  (1.753 m), weight 162 lb (73.483 kg), SpO2 100.00%. Gen: Alert, well appearing.  Patient is oriented to person, place, time, and situation. AFFECT: pleasant, lucid thought and speech. CV: RRR, no m/r/g.   LUNGS: CTA bilat, nonlabored resps, good aeration in all lung fields. ABD: soft ,NT, ND, no mass or bruit.   IMPRESSION AND PLAN:  Left leg pain Lumbar DDD/spondylosis--chronic changes with some acute lower lumbar changes causing L5 and S1 nerve root impingement. Continue with PT and ortho f/u. No pain meds at this time--he is worried that it is these pills that may have caused him to feel  sick recently.  Avoid NSAIDs.  May use tylenol.  Labile hypertension With episodic severe HTN with symptoms suspicious for pheochromocytoma--especially with his bilateral adrenal adenomas noted on the CT angio of chest when in the ED 03/08/13. Will check serum metanephrine levels and will get MRI abd/pelv w and w/o contrast to get better adrenal imaging. For now, he'll continue taking 320mg  diovan qd.  Solitary pulmonary nodule 7mm RUL 03/08/13--repeat 3-6 mo due to pt's high risk of cancer (chronic tob dependence--ongoing)   Tobacco dependence Encouraged pt to stop smoking once again today.   Obtain ortho records.  An After Visit Summary was printed and given to the patient.  FOLLOW UP: 1 mo

## 2013-03-14 ENCOUNTER — Encounter: Payer: Self-pay | Admitting: Family Medicine

## 2013-03-14 DIAGNOSIS — F17200 Nicotine dependence, unspecified, uncomplicated: Secondary | ICD-10-CM | POA: Insufficient documentation

## 2013-03-14 DIAGNOSIS — R0989 Other specified symptoms and signs involving the circulatory and respiratory systems: Secondary | ICD-10-CM | POA: Insufficient documentation

## 2013-03-14 DIAGNOSIS — R911 Solitary pulmonary nodule: Secondary | ICD-10-CM

## 2013-03-14 HISTORY — DX: Solitary pulmonary nodule: R91.1

## 2013-03-14 NOTE — Assessment & Plan Note (Signed)
Encouraged pt to stop smoking once again today.

## 2013-03-14 NOTE — Assessment & Plan Note (Signed)
7mm RUL 03/08/13--repeat 3-6 mo due to pt's high risk of cancer (chronic tob dependence--ongoing)

## 2013-03-14 NOTE — Assessment & Plan Note (Signed)
With episodic severe HTN with symptoms suspicious for pheochromocytoma--especially with his bilateral adrenal adenomas noted on the CT angio of chest when in the ED 03/08/13. Will check serum metanephrine levels and will get MRI abd/pelv w and w/o contrast to get better adrenal imaging. For now, he'll continue taking 320mg  diovan qd.

## 2013-03-14 NOTE — Assessment & Plan Note (Signed)
Lumbar DDD/spondylosis--chronic changes with some acute lower lumbar changes causing L5 and S1 nerve root impingement. Continue with PT and ortho f/u. No pain meds at this time--he is worried that it is these pills that may have caused him to feel sick recently.  Avoid NSAIDs.  May use tylenol.

## 2013-03-17 ENCOUNTER — Ambulatory Visit (HOSPITAL_COMMUNITY): Payer: Medicare PPO | Admitting: Physical Therapy

## 2013-03-17 LAB — METANEPHRINES, PLASMA
Metanephrine, Free: 42 pg/mL (ref <=57)
Total Metanephrines-Plasma: 179 pg/mL (ref <=205)

## 2013-03-18 ENCOUNTER — Telehealth: Payer: Self-pay | Admitting: Family Medicine

## 2013-03-18 NOTE — Telephone Encounter (Signed)
Patients daughter is calling for results of recent blood work.  Please advise. Thanks.

## 2013-03-19 ENCOUNTER — Ambulatory Visit
Admission: RE | Admit: 2013-03-19 | Discharge: 2013-03-19 | Disposition: A | Discharge status: Home / Self Care | Payer: Medicare PPO | Source: Ambulatory Visit | Attending: Family Medicine | Admitting: Family Medicine

## 2013-03-19 DIAGNOSIS — D3500 Benign neoplasm of unspecified adrenal gland: Secondary | ICD-10-CM

## 2013-03-19 MED ORDER — GADOBENATE DIMEGLUMINE 529 MG/ML IV SOLN
15.0000 mL | Freq: Once | INTRAVENOUS | Status: AC | PRN
  Administered 2013-03-19: 15 mL via INTRAVENOUS

## 2013-03-19 NOTE — Telephone Encounter (Signed)
They were normal.  I was waiting for the MRI abdomen result before calling them so I could give all results at once.--thx

## 2013-03-20 NOTE — Telephone Encounter (Signed)
Left detailed message on Jeffrey [name]'s cell phone.

## 2013-03-24 ENCOUNTER — Telehealth: Payer: Self-pay | Admitting: Family Medicine

## 2013-03-24 MED ORDER — METOPROLOL TARTRATE 25 MG PO TABS: 25.0000 mg | ORAL_TABLET | Freq: Two times a day (BID) | ORAL | Status: DC

## 2013-03-24 NOTE — Telephone Encounter (Signed)
Pls tell her I'm sending an additional bp med to his pharmacy and he should take the first dose tonight. Continue taking the current bp med as well. Ask her if he has complained of any symptoms with his high blood pressure readings. Monitor bp and heart rate twice a day at home, call in 2-3 days with report to nurse. (Generic lopressor 25mg  bid, #60, RF x 1.) Thx-

## 2013-03-24 NOTE — Telephone Encounter (Signed)
lmom for pt's daughter, Archie Patten to call back regarding provider instructions.

## 2013-03-24 NOTE — Telephone Encounter (Signed)
Patient's daughter, Archie Patten called stating patients BP was up to 213/100.  She is concerned that medication is not working.  Please advise.

## 2013-03-25 MED ORDER — OXYCODONE-ACETAMINOPHEN 5-325 MG PO TABS: 1.0000 | ORAL_TABLET | Freq: Four times a day (QID) | ORAL | Status: DC | PRN

## 2013-03-25 NOTE — Telephone Encounter (Signed)
Rx printed

## 2013-03-25 NOTE — Telephone Encounter (Signed)
Pt's daughter said his only complaint was headaches occasionally. She is aware of the plan and she will call me at the end of the week to see how he is doing.

## 2013-03-25 NOTE — Telephone Encounter (Signed)
Patients daughter aware.  She will come to pick up medication at front desk.

## 2013-03-25 NOTE — Telephone Encounter (Signed)
Pt's daughter, Archie Patten called to see if you could rx anymore of the oxycodone for patient.  She said his legs are really hurting him. Please advise.

## 2013-03-28 ENCOUNTER — Telehealth: Payer: Self-pay | Admitting: Family Medicine

## 2013-03-28 ENCOUNTER — Encounter: Payer: Self-pay | Admitting: Family Medicine

## 2013-03-28 ENCOUNTER — Ambulatory Visit (INDEPENDENT_AMBULATORY_CARE_PROVIDER_SITE_OTHER): Payer: Medicare PPO | Admitting: Family Medicine

## 2013-03-28 VITALS — BP 142/72 | HR 52 | Temp 99.2°F | Resp 18 | Ht 69.0 in | Wt 167.0 lb

## 2013-03-28 DIAGNOSIS — M79605 Pain in left leg: Secondary | ICD-10-CM

## 2013-03-28 DIAGNOSIS — M79609 Pain in unspecified limb: Secondary | ICD-10-CM

## 2013-03-28 DIAGNOSIS — I10 Essential (primary) hypertension: Secondary | ICD-10-CM

## 2013-03-28 DIAGNOSIS — R0989 Other specified symptoms and signs involving the circulatory and respiratory systems: Secondary | ICD-10-CM

## 2013-03-28 MED ORDER — CLONIDINE HCL 0.1 MG/24HR TD PTWK: 1.0000 | MEDICATED_PATCH | TRANSDERMAL | Status: DC

## 2013-03-28 NOTE — Progress Notes (Signed)
OFFICE NOTE  03/28/2013  CC:  Chief Complaint  Patient presents with  . Hypertension     HPI: Patient is a 72 y.o. Caucasian male who is here for f/u of uncontrolled HTN. BP's have still been stage 2 but a bit lower than they had been prior to starting the lopressor.  However, he has had several normal bp readings--random--as well as one reading 80s over 50s. HR was consistently 70s or more but since lopressor started they are in 50s-low 60s.  Denies dizziness, CP, SOB, or HAs. Left leg/thigh pain is MUCH improved--some days doesn't have to take any pain med at all.  He is limiting lifting and bending at the waist.  PT has helped.   Pertinent PMH:  Past Medical History  Diagnosis Date  . Carotid artery occlusion   . Hypertension   . Chronic kidney disease     No RAS on 2003 angiogram (done for + renal artery dopplers--deemed eventually to have been a false positive).  Dr. Allyson Sabal.  . Hyperlipidemia   . Adenomatous colon polyp     Dr. Karilyn Cota (28 polyps on 1st endo, 3 on 2nd endo a year later.  He is due for repeat endoscopy as of 12/2012  . BPH (benign prostatic hyperplasia)     Dr. Laurance Flatten (pt is asymptomatic)  . Plantar wart of left foot 12/21/2012  . Solitary pulmonary nodule 03/14/2013    7mm RUL 03/08/13--repeat 3-6 mo due to pt's high risk of cancer (chronic tob dependence--ongoing)   . DDD (degenerative disc disease), lumbar 02/2013    with spondylosis and moderate spinal stenosis+ left L5 and S1 nerve root impingement  . Fatty liver 2006    Noted on abd u/s and renal u/s 2006/2007   Past Surgical History  Procedure Laterality Date  . Carotid endarterectomy  11/25/08    Left     ICA  . Appendectomy  remote  . Arm surgery  July 2010    Left arm cyst/Lipoma  . Umbilical hernia repair    . Tonsillectomy  remote    MEDS:  Outpatient Prescriptions Prior to Visit  Medication Sig Dispense Refill  . aspirin 81 MG chewable tablet Chew 81 mg by mouth daily.      . metoprolol  tartrate (LOPRESSOR) 25 MG tablet Take 1 tablet (25 mg total) by mouth 2 (two) times daily.  60 tablet  1  . valsartan (DIOVAN) 160 MG tablet Take 2 tablets (320 mg total) by mouth daily.  60 tablet  6  . omeprazole (PRILOSEC) 20 MG capsule Take 20 mg by mouth daily as needed (heartburn).       Marland Kitchen oxyCODONE-acetaminophen (PERCOCET/ROXICET) 5-325 MG per tablet Take 1-2 tablets by mouth every 6 (six) hours as needed for pain.  60 tablet  0   No facility-administered medications prior to visit.    PE: Blood pressure 142/72, pulse 52, temperature 99.2 F (37.3 C), temperature source Temporal, resp. rate 18, height 5\' 9"  (1.753 m), weight 167 lb (75.751 kg), SpO2 96.00%. Gen: Alert, well appearing.  Patient is oriented to person, place, time, and situation. CV: RRR, distant S1 and S2, 1-2/6 systolic murmur at LUSB and RUSB Lungs: CTA bilat, nonlabored resps. EXT: no clubbing, cyanosis, or edema.   LABS: none today   Chemistry      Component Value Date/Time   NA 140 03/08/2013 0835   K 4.1 03/08/2013 0835   CL 100 03/08/2013 0835   CO2 30 03/08/2013 0835   BUN  11 03/08/2013 0835   CREATININE 1.22 03/08/2013 0835      Component Value Date/Time   CALCIUM 9.8 03/08/2013 0835   ALKPHOS 89 03/08/2013 0835   AST 26 03/08/2013 0835   ALT 20 03/08/2013 0835   BILITOT 0.2* 03/08/2013 0835       IMPRESSION AND PLAN:  Left leg pain Much improved. Prn percocet ok, but if further pain pills needed for this problem we'll step down to vicodin or tramadol.  Labile hypertension Improving. Continue lopressor 25mg  bid, valsartan 160mg  qd, and add catapress TTS patch 0.1--1 patch per week. He has cardiology f/u at the end of this month. Continue home bp monitoring. Continue to hold aspirin until we consistently have no more than stage 1 HTN readings.   An After Visit Summary was printed and given to the patient.  Spent 30 min with pt today, with >50% of this time spent in counseling and care  coordination regarding the above problems.  FOLLOW UP: 6 wks

## 2013-03-28 NOTE — Telephone Encounter (Signed)
Noted  

## 2013-03-28 NOTE — Telephone Encounter (Signed)
Patient Information:  Caller Name: Harriett Sine  Phone: [phone]  Patient: Jeffrey [name], Jeffrey [name]  Gender: Male  DOB: 04/08/1940  Age: 72 Years  PCP: Earley Favor [facility])  Office Follow Up:  Does the office need to follow up with this patient?: No  Instructions For The Office: N/A  RN Note:  Last office visit 03/12/13.  Started on Lopressor BID 03/25/13.  BP 197/87 P 76 L arm 03/28/16 at 0900 before taking Lopressor dose. BP 166/82 left arm at 09:48 after Lopressor.  BP ranges since 03/25/13:systolic 126-193/ diastolic 72-90.  Has follow up visit for 04/12/13.  Symptoms  Reason For Call & Symptoms: Called to report BP results since January.  Began new BP, Lopressor 25 mg BID on 03/25/13  Reviewed Health History In EMR: Yes  Reviewed Medications In EMR: Yes  Reviewed Allergies In EMR: Yes  Reviewed Surgeries / Procedures: Yes  Date of Onset of Symptoms: 03/25/2013  Treatments Tried: Lopressor, Diovan  Treatments Tried Worked: Yes  Guideline(s) Used:  High Blood Pressure  Disposition Per Guideline:   See Today in Office  Reason For Disposition Reached:   BP > 180/110  Advice Given:  General:  Untreated high blood pressure may cause damage to the heart, brain, kidneys, and eyes.  Treatment of high blood pressure can reduce the risk of stroke, heart attack, and heart failure.  The goal of blood pressure treatment for most patients with hypertension is to keep the blood pressure under 140/90.  Call Back If:  Headache, blurred vision, difficulty talking, or difficulty walking occurs  Chest pain or difficulty breathing occurs  You want to come in to the office for a blood pressure check  You become worse.  Patient Will Follow Care Advice:  YES  Appointment Scheduled:  03/28/2013 11:00:00 Appointment Scheduled Provider:  Earley Favor [facility])

## 2013-03-28 NOTE — Assessment & Plan Note (Addendum)
Improving. Continue lopressor 25mg  bid, valsartan 160mg  qd, and add catapress TTS patch 0.1--1 patch per week. He has cardiology f/u at the end of this month. Continue home bp monitoring. Continue to hold aspirin until we consistently have no more than stage 1 HTN readings.

## 2013-03-28 NOTE — Assessment & Plan Note (Signed)
Much improved. Prn percocet ok, but if further pain pills needed for this problem we'll step down to vicodin or tramadol.

## 2013-03-28 NOTE — Telephone Encounter (Signed)
Please advise 

## 2013-04-01 ENCOUNTER — Ambulatory Visit (INDEPENDENT_AMBULATORY_CARE_PROVIDER_SITE_OTHER): Payer: Medicare PPO | Admitting: Physician Assistant

## 2013-04-01 ENCOUNTER — Encounter: Payer: Self-pay | Admitting: Physician Assistant

## 2013-04-01 VITALS — BP 136/70 | HR 61 | Ht 69.0 in | Wt 165.0 lb

## 2013-04-01 DIAGNOSIS — I10 Essential (primary) hypertension: Secondary | ICD-10-CM

## 2013-04-01 DIAGNOSIS — R0602 Shortness of breath: Secondary | ICD-10-CM

## 2013-04-01 DIAGNOSIS — R0600 Dyspnea, unspecified: Secondary | ICD-10-CM

## 2013-04-01 DIAGNOSIS — R0989 Other specified symptoms and signs involving the circulatory and respiratory systems: Secondary | ICD-10-CM

## 2013-04-01 NOTE — Assessment & Plan Note (Signed)
Blood pressure stable at this time. °

## 2013-04-01 NOTE — Progress Notes (Signed)
Date:  04/01/2013   ID:  Jeffrey [name], DOB Feb 21, 1941, MRN [id number]  PCP:  Jeffrey Massed, MD  Primary Cardiologist:  Hilty    History of Present Illness: Jeffrey [name] is a 72 y.o. male with a history of continued tobacco abuse, mild carotid disease and prior carotid endarterectomy along with dyslipidemia. He had metabolic testing last year which was mildly abnormal ST echocardiogram was also exactly one year ago and showed ejection fraction greater than 55% with impaired LV relaxation. Left atrium was normal size. Mild mitral regurgitation. Normal right ventricular systolic pressures. Mild to moderate aortic regurgitation. He's also had some shortness of breath indicated by prior notes which is most likely related to smoking.  Patient presents today with complaints of worsening in shortness of breath particularly when he is lying down. In sleeping on 2-3 pillows which partially is because of his back. Patient states when he gets up and moves around quickly outside he feels better is breathing better. He denies any lower extremity edema, PND or wheezing.  He has selected a quit date for smoking which is [birth year] on [birth year].  The patient currently denies nausea, vomiting, fever, chest pain, dizziness, PND, cough, congestion, abdominal pain, hematochezia, melena, lower extremity edema, claudication.  Wt Readings from Last 3 Encounters:  04/01/13 165 lb (74.844 kg)  03/28/13 167 lb (75.751 kg)  03/12/13 162 lb (73.483 kg)     Past Medical History  Diagnosis Date  . Carotid artery occlusion     carotid dopplers 02/06/12-patent left carotid endarterectomy; right internal carotid 40-59%  . Hypertension   . Chronic kidney disease     No RAS on 2003 angiogram (done for + renal artery dopplers--deemed eventually to have been a false positive).  Dr. Allyson Sabal.  . Hyperlipidemia   . Adenomatous colon polyp     Dr. Karilyn Cota (28 polyps on 1st endo, 3 on 2nd endo a year later.  He is due for  repeat endoscopy as of 12/2012  . BPH (benign prostatic hyperplasia)     Dr. Laurance Flatten (pt is asymptomatic)  . Plantar wart of left foot 12/21/2012  . Solitary pulmonary nodule 03/14/2013    7mm RUL 03/08/13--repeat 3-6 mo due to pt's high risk of cancer (chronic tob dependence--ongoing)   . DDD (degenerative disc disease), lumbar 02/2013    with spondylosis and moderate spinal stenosis+ left L5 and S1 nerve root impingement  . Fatty liver 2006    Noted on abd u/s and renal u/s 2006/2007  . Aortic insufficiency     echo 03/25/12-EF>55%, mild-mod Aortic regurg, sclerotic aortic valve,   . SOB (shortness of breath)     MET test 03/25/12-mildly abnormal    Current Outpatient Prescriptions  Medication Sig Dispense Refill  . cloNIDine (CATAPRES-TTS-1) 0.1 mg/24hr patch Place 1 patch (0.1 mg total) onto the skin once a week.  4 patch  6  . metoprolol tartrate (LOPRESSOR) 25 MG tablet Take 1 tablet (25 mg total) by mouth 2 (two) times daily.  60 tablet  1  . Multiple Vitamins-Minerals (MULTIVITAMIN PO) Take by mouth.      Marland Kitchen omeprazole (PRILOSEC) 20 MG capsule Take 20 mg by mouth daily as needed (heartburn).       Marland Kitchen oxyCODONE-acetaminophen (PERCOCET/ROXICET) 5-325 MG per tablet Take 1-2 tablets by mouth every 6 (six) hours as needed for pain.  60 tablet  0  . valsartan (DIOVAN) 160 MG tablet Take 2 tablets (320 mg total) by mouth daily.  60 tablet  6   No current facility-administered medications for this visit.    Allergies:   No Known Allergies  Social History:  The patient  reports that he has been smoking Cigarettes.  He has a 50 pack-year smoking history. He has never used smokeless tobacco. He reports that he does not drink alcohol or use illicit drugs.   Family history:   Family History  Problem Relation Age of Onset  . Coronary artery disease Mother   . Stroke Mother   . Diabetes Mother     ROS:  Please see the history of present illness.  All other systems reviewed and negative.    PHYSICAL EXAM: VS:  BP 136/70  Pulse 61  Ht 5\' 9"  (1.753 m)  Wt 165 lb (74.844 kg)  BMI 24.36 kg/m2 Well nourished, well developed, in no acute distress HEENT: Pupils are equal round react to light accommodation extraocular movements are intact. Neck: no JVDNo cervical lymphadenopathy.  Left carotid bruit Cardiac: Regular rate and rhythm without murmurs rubs or gallops. Lungs:  clear to auscultation bilaterally, no wheezing, rhonchi or rales Abd: soft, nontender, positive bowel sounds all quadrants, no hepatosplenomegaly Ext: no lower extremity edema.  2+ radial and dorsalis pedis pulses. Skin: warm and dry Neuro:  Grossly normal  EKG: Sinus rhythm rate of 61 beats per minute first degree AV block, ST elevation inferior and laterally likely repolarization abnormalities seen on prior EKG  ASSESSMENT AND PLAN:  Problem List Items Addressed This Visit   Essential hypertension, benign     Blood pressure stable at this time    Dyspnea     Although the patient complains of dyspnea particularly when he's lying down, I do not think he is in heart failure. He has no JVD no lower extremity edema and lung sounds although mildly diminished but no rales.  He has selected a quit date of January 29 which is coming up shortly there may be some anxiety related to that.  Also do not think he is having any type of COPD exacerbation. Do not think we need to repeat a 2-D echocardiogram. He's had a recent met test.  He will call if he notices worsening symptoms.     Other Visit Diagnoses   SOB (shortness of breath)    -  Primary    Relevant Orders       EKG 12-Lead

## 2013-04-01 NOTE — Patient Instructions (Signed)
Followup with Dr. Hilty in 6 months 

## 2013-04-01 NOTE — Assessment & Plan Note (Addendum)
Although the patient complains of dyspnea particularly when he's lying down, I do not think he is in heart failure. He has no JVD no lower extremity edema and lung sounds although mildly diminished but no rales.  He has selected a quit date of January 29 which is coming up shortly there may be some anxiety related to that.  Also do not think he is having any type of COPD exacerbation. Do not think we need to repeat a 2-D echocardiogram. He's had a recent met test.  He will call if he notices worsening symptoms.

## 2013-04-04 ENCOUNTER — Telehealth: Payer: Self-pay | Admitting: Family Medicine

## 2013-04-04 MED ORDER — METOPROLOL TARTRATE 25 MG PO TABS: 25.0000 mg | ORAL_TABLET | Freq: Two times a day (BID) | ORAL | Status: DC

## 2013-04-04 NOTE — Telephone Encounter (Signed)
Wife in today with her granddaughter and reports that Xylan has had some bp's down into normal range and even some as low as 90 syst over 50-60 diast.  No heart rate report.  I recommended he decrease his metoprolol to 1/2 of the 25mg  tab bid and continue current doses of diovan and catapress TTS patch.

## 2013-04-07 ENCOUNTER — Ambulatory Visit: Payer: Medicare PPO | Admitting: Internal Medicine

## 2013-04-11 ENCOUNTER — Ambulatory Visit: Payer: Medicare PPO | Admitting: Family Medicine

## 2013-05-21 ENCOUNTER — Other Ambulatory Visit: Payer: Self-pay | Admitting: Family Medicine

## 2013-05-22 ENCOUNTER — Ambulatory Visit (INDEPENDENT_AMBULATORY_CARE_PROVIDER_SITE_OTHER): Payer: Medicare PPO | Admitting: Family Medicine

## 2013-05-22 ENCOUNTER — Encounter: Payer: Self-pay | Admitting: Family Medicine

## 2013-05-22 VITALS — BP 146/84 | HR 75 | Temp 99.1°F | Resp 18 | Ht 69.0 in | Wt 165.0 lb

## 2013-05-22 DIAGNOSIS — D126 Benign neoplasm of colon, unspecified: Secondary | ICD-10-CM

## 2013-05-22 DIAGNOSIS — Z23 Encounter for immunization: Secondary | ICD-10-CM

## 2013-05-22 DIAGNOSIS — R911 Solitary pulmonary nodule: Secondary | ICD-10-CM

## 2013-05-22 DIAGNOSIS — R0989 Other specified symptoms and signs involving the circulatory and respiratory systems: Secondary | ICD-10-CM

## 2013-05-22 DIAGNOSIS — I10 Essential (primary) hypertension: Secondary | ICD-10-CM

## 2013-05-22 NOTE — Patient Instructions (Addendum)
Restart your 81mg  Aspirin once daily.  Call Dr. Patty Sermons office to schedule repeat colonoscopy.

## 2013-05-22 NOTE — Assessment & Plan Note (Signed)
Dr. Karilyn Cota (28 polyps on 1st endo, 3 on 2nd endo a year later).  He is past due for repeat endoscopy as of 12/2012---he had acute back pain followed by labile HTN that we have been working on. He is now ready to contact Dr. Patty Sermons office to reschedule a f/u colonoscopy.

## 2013-05-22 NOTE — Progress Notes (Signed)
OFFICE NOTE  05/22/2013  CC:  Chief Complaint  Patient presents with  . Follow-up    one month  . Insect Bite    pulled about 50 ticks off      HPI: Patient is a 72 y.o. Caucasian male who is here for 2 mo f/u HTN. Feeling well. Review of daily bp's since I saw him last show many normal readings as well as some systolics still in Stage 1 range, even has had some systolics 90s-100s.  Says he does not feel any different when his bp is low systolic.  HR avg 60s.  No CP, no palpitations,no dizziness, no HAs, no vision complaints.  Back is continuing to get better--at times lately has had days without any pain.  Used chain saw again recently and did fine with it, avoided lifting. No cardio f/u since I saw him last.   Pertinent PMH:  Past Medical History  Diagnosis Date  . Carotid artery occlusion     carotid dopplers 02/06/12-patent left carotid endarterectomy; right internal carotid 40-59%  . Hypertension   . Chronic kidney disease     No RAS on 2003 angiogram (done for + renal artery dopplers--deemed eventually to have been a false positive).  Dr. Allyson Sabal.  . Hyperlipidemia   . Adenomatous colon polyp     Dr. Karilyn Cota (28 polyps on 1st endo, 3 on 2nd endo a year later.  He is due for repeat endoscopy as of 12/2012  . BPH (benign prostatic hyperplasia)     Dr. Laurance Flatten (pt is asymptomatic)  . Plantar wart of left foot 12/21/2012  . Solitary pulmonary nodule 03/14/2013    7mm RUL 03/08/13--repeat 3-6 mo due to pt's high risk of cancer (chronic tob dependence--ongoing)   . DDD (degenerative disc disease), lumbar 02/2013    with spondylosis and moderate spinal stenosis+ left L5 and S1 nerve root impingement  . Fatty liver 2006    Noted on abd u/s and renal u/s 2006/2007  . Aortic insufficiency     echo 03/25/12-EF>55%, mild-mod Aortic regurg, sclerotic aortic valve,   . SOB (shortness of breath)     MET test 03/25/12-mildly abnormal   Past surgical, social, and family history reviewed and no  changes noted since last office visit.  MEDS:  Outpatient Prescriptions Prior to Visit  Medication Sig Dispense Refill  . cloNIDine (CATAPRES-TTS-1) 0.1 mg/24hr patch Place 1 patch (0.1 mg total) onto the skin once a week.  4 patch  6  . metoprolol tartrate (LOPRESSOR) 25 MG tablet Take 1 tablet (25 mg total) by mouth 2 (two) times daily. 1/2 tab po bid  60 tablet  1  . Multiple Vitamins-Minerals (MULTIVITAMIN PO) Take by mouth.      Marland Kitchen omeprazole (PRILOSEC) 20 MG capsule Take 20 mg by mouth daily as needed (heartburn).       . valsartan (DIOVAN) 160 MG tablet Take 2 tablets (320 mg total) by mouth daily.  60 tablet  6  . oxyCODONE-acetaminophen (PERCOCET/ROXICET) 5-325 MG per tablet Take 1-2 tablets by mouth every 6 (six) hours as needed for pain.  60 tablet  0  . metoprolol tartrate (LOPRESSOR) 25 MG tablet take 1 tablet by mouth twice a day  60 tablet  1   No facility-administered medications prior to visit.    PE: Blood pressure 146/84, pulse 75, temperature 99.1 F (37.3 C), temperature source Temporal, resp. rate 18, height 5\' 9"  (1.753 m), weight 165 lb (74.844 kg), SpO2 98.00%. Gen: Alert, well appearing.  Patient is oriented to person, place, time, and situation. CV: RRR, occasional ectopic beat, somewhat distant S1 and S2, without audible murmur/rub/gallop. Chest is clear, no wheezing or rales. Normal symmetric air entry throughout both lung fields. No chest wall deformities or tenderness. EXT: no clubbing, cyanosis, or edema.   LABS: none today   Chemistry      Component Value Date/Time   NA 140 03/08/2013 0835   K 4.1 03/08/2013 0835   CL 100 03/08/2013 0835   CO2 30 03/08/2013 0835   BUN 11 03/08/2013 0835   CREATININE 1.22 03/08/2013 0835      Component Value Date/Time   CALCIUM 9.8 03/08/2013 0835   ALKPHOS 89 03/08/2013 0835   AST 26 03/08/2013 0835   ALT 20 03/08/2013 0835   BILITOT 0.2* 03/08/2013 0835       IMPRESSION AND PLAN:  Labile hypertension Control much  improved. Avg syst and diast now w/in normal range. Continue all current meds. Restart ASA 81mg  qd.  Adenomatous colon polyp Dr. Karilyn Cota (28 polyps on 1st endo, 3 on 2nd endo a year later).  He is past due for repeat endoscopy as of 12/2012---he had acute back pain followed by labile HTN that we have been working on. He is now ready to contact Dr. Patty Sermons office to reschedule a f/u colonoscopy.  Solitary pulmonary nodule 7mm RUL 03/08/13--repeat 3-6 mo due to pt's high risk of cancer (chronic tob dependence--ongoing). Will arrange repeat at pt's next f/u visit.   Flu vaccine IM today.  An After Visit Summary was printed and given to the patient.  FOLLOW UP: 6 mo

## 2013-05-22 NOTE — Assessment & Plan Note (Signed)
Control much improved. Avg syst and diast now w/in normal range. Continue all current meds. Restart ASA 81mg  qd.

## 2013-05-22 NOTE — Assessment & Plan Note (Signed)
7mm RUL 03/08/13--repeat 3-6 mo due to pt's high risk of cancer (chronic tob dependence--ongoing). Will arrange repeat at pt's next f/u visit.

## 2013-05-23 ENCOUNTER — Ambulatory Visit: Payer: Medicare PPO | Admitting: Family Medicine

## 2013-05-23 DIAGNOSIS — Z23 Encounter for immunization: Secondary | ICD-10-CM

## 2013-06-17 ENCOUNTER — Other Ambulatory Visit: Payer: Medicare PPO

## 2013-06-18 ENCOUNTER — Other Ambulatory Visit: Payer: Medicare PPO

## 2013-06-25 ENCOUNTER — Ambulatory Visit: Payer: Medicare PPO | Admitting: Family Medicine

## 2013-06-26 ENCOUNTER — Other Ambulatory Visit: Payer: Self-pay | Admitting: Family Medicine

## 2013-06-26 ENCOUNTER — Telehealth: Payer: Self-pay | Admitting: Family Medicine

## 2013-06-26 ENCOUNTER — Other Ambulatory Visit (INDEPENDENT_AMBULATORY_CARE_PROVIDER_SITE_OTHER): Payer: Medicare PPO

## 2013-06-26 ENCOUNTER — Encounter: Payer: Self-pay | Admitting: Family Medicine

## 2013-06-26 DIAGNOSIS — I10 Essential (primary) hypertension: Secondary | ICD-10-CM

## 2013-06-26 DIAGNOSIS — N182 Chronic kidney disease, stage 2 (mild): Secondary | ICD-10-CM

## 2013-06-26 DIAGNOSIS — E875 Hyperkalemia: Secondary | ICD-10-CM

## 2013-06-26 LAB — BASIC METABOLIC PANEL
BUN: 16 mg/dL (ref 6–23)
Chloride: 104 mEq/L (ref 96–112)
Creatinine, Ser: 1.3 mg/dL (ref 0.4–1.5)
Glucose, Bld: 103 mg/dL — ABNORMAL HIGH (ref 70–99)
Potassium: 5.3 mEq/L — ABNORMAL HIGH (ref 3.5–5.1)

## 2013-06-26 LAB — LIPID PANEL
Cholesterol: 124 mg/dL (ref 0–200)
LDL Cholesterol: 71 mg/dL (ref 0–99)
Triglycerides: 98 mg/dL (ref 0.0–149.0)
VLDL: 19.6 mg/dL (ref 0.0–40.0)

## 2013-06-26 MED ORDER — OXYCODONE-ACETAMINOPHEN 5-325 MG PO TABS: 1.0000 | ORAL_TABLET | Freq: Four times a day (QID) | ORAL | Status: DC | PRN

## 2013-06-26 NOTE — Progress Notes (Signed)
Labs only

## 2013-06-26 NOTE — Telephone Encounter (Signed)
Message left on patient VM stating medication is ready for pick up.

## 2013-06-26 NOTE — Telephone Encounter (Signed)
Rx printed for percocet, #60, no RF.

## 2013-06-26 NOTE — Telephone Encounter (Signed)
Patient requesting refill for oxycodone.  Patient last seen in office 05/22/13.  Medication last printed 03/25/13.  Please advise refill.

## 2013-06-26 NOTE — Telephone Encounter (Signed)
Patient requesting a refill on Oxycodone, call when ready for pick up

## 2013-07-04 ENCOUNTER — Other Ambulatory Visit (INDEPENDENT_AMBULATORY_CARE_PROVIDER_SITE_OTHER): Payer: Medicare PPO

## 2013-07-04 DIAGNOSIS — I10 Essential (primary) hypertension: Secondary | ICD-10-CM

## 2013-07-04 DIAGNOSIS — N182 Chronic kidney disease, stage 2 (mild): Secondary | ICD-10-CM

## 2013-07-04 DIAGNOSIS — E875 Hyperkalemia: Secondary | ICD-10-CM

## 2013-07-04 LAB — BASIC METABOLIC PANEL
CO2: 30 mEq/L (ref 19–32)
Calcium: 9.6 mg/dL (ref 8.4–10.5)
Creat: 1.19 mg/dL (ref 0.50–1.35)
Glucose, Bld: 115 mg/dL — ABNORMAL HIGH (ref 70–99)
Sodium: 142 mEq/L (ref 135–145)

## 2013-07-04 NOTE — Progress Notes (Signed)
Labs only

## 2013-07-08 ENCOUNTER — Other Ambulatory Visit: Payer: Self-pay | Admitting: Family Medicine

## 2013-07-08 DIAGNOSIS — R918 Other nonspecific abnormal finding of lung field: Secondary | ICD-10-CM

## 2013-07-08 DIAGNOSIS — F17200 Nicotine dependence, unspecified, uncomplicated: Secondary | ICD-10-CM

## 2013-07-15 ENCOUNTER — Ambulatory Visit
Admission: RE | Admit: 2013-07-15 | Discharge: 2013-07-15 | Disposition: A | Discharge status: Home / Self Care | Payer: Medicare PPO | Source: Ambulatory Visit | Attending: Family Medicine | Admitting: Family Medicine

## 2013-07-15 ENCOUNTER — Other Ambulatory Visit: Payer: Self-pay | Admitting: Family Medicine

## 2013-07-15 DIAGNOSIS — F17200 Nicotine dependence, unspecified, uncomplicated: Secondary | ICD-10-CM

## 2013-07-15 DIAGNOSIS — R918 Other nonspecific abnormal finding of lung field: Secondary | ICD-10-CM

## 2013-07-16 ENCOUNTER — Other Ambulatory Visit: Payer: Self-pay | Admitting: Family Medicine

## 2013-08-29 ENCOUNTER — Encounter: Payer: Self-pay | Admitting: Internal Medicine

## 2013-09-22 ENCOUNTER — Other Ambulatory Visit: Payer: Self-pay | Admitting: Family Medicine

## 2013-09-22 MED ORDER — METOPROLOL TARTRATE 25 MG PO TABS: ORAL_TABLET | ORAL | Status: DC

## 2013-10-14 ENCOUNTER — Other Ambulatory Visit: Payer: Self-pay | Admitting: *Deleted

## 2013-10-14 MED ORDER — ATORVASTATIN CALCIUM 80 MG PO TABS: 80.0000 mg | ORAL_TABLET | Freq: Every day | ORAL | Status: DC

## 2013-11-24 ENCOUNTER — Ambulatory Visit (INDEPENDENT_AMBULATORY_CARE_PROVIDER_SITE_OTHER): Payer: Medicare PPO | Admitting: Family Medicine

## 2013-11-24 ENCOUNTER — Encounter: Payer: Self-pay | Admitting: Family Medicine

## 2013-11-24 VITALS — BP 169/83 | HR 68 | Temp 99.1°F | Resp 18 | Ht 69.0 in | Wt 165.0 lb

## 2013-11-24 DIAGNOSIS — E785 Hyperlipidemia, unspecified: Secondary | ICD-10-CM

## 2013-11-24 DIAGNOSIS — I10 Essential (primary) hypertension: Secondary | ICD-10-CM

## 2013-11-24 DIAGNOSIS — H6120 Impacted cerumen, unspecified ear: Secondary | ICD-10-CM

## 2013-11-24 DIAGNOSIS — D126 Benign neoplasm of colon, unspecified: Secondary | ICD-10-CM

## 2013-11-24 DIAGNOSIS — R0989 Other specified symptoms and signs involving the circulatory and respiratory systems: Secondary | ICD-10-CM

## 2013-11-24 DIAGNOSIS — F17200 Nicotine dependence, unspecified, uncomplicated: Secondary | ICD-10-CM

## 2013-11-24 NOTE — Progress Notes (Addendum)
OFFICE NOTE  11/24/2013  CC:  Chief Complaint  Patient presents with  . Follow-up     HPI: Patient is a 73 y.o. Caucasian male who is here for 6 mo f/u HTN, hyperlipidemia, tobacco dependence. Clonidine patch caused local rash so he d/c'd it. Says he feels pretty good.  However, asks if I can check his ears for wax b/c can't hear well lately.  Cutting back on smoking very little.   Home bp checks: mostly normal (<140/90).  Occ reading of systolic in the 32G and feels sluggish so he'll skip a day of med and then resume and feels ok.  This describes his bp lability over the years no matter what meds--per pt report today.  Pertinent PMH:  Past medical, surgical, social, and family history reviewed and no changes are noted since last office visit.  MEDS: Takes lopressor 1/2 of 25mg  tab bid Takes 81mg  ASA qd Outpatient Prescriptions Prior to Visit  Medication Sig Dispense Refill  . atorvastatin (LIPITOR) 80 MG tablet Take 1 tablet (80 mg total) by mouth daily.  30 tablet  5  . metoprolol tartrate (LOPRESSOR) 25 MG tablet Take 1 tablet (25 mg total) by mouth 2 (two) times daily. 1/2 tab po bid  60 tablet  1  . metoprolol tartrate (LOPRESSOR) 25 MG tablet take 1 tablet by mouth twice a day  60 tablet  1  . Multiple Vitamins-Minerals (MULTIVITAMIN PO) Take by mouth.      Marland Kitchen omeprazole (PRILOSEC) 20 MG capsule Take 20 mg by mouth daily as needed (heartburn).       . valsartan (DIOVAN) 160 MG tablet Take 2 tablets (320 mg total) by mouth daily.  60 tablet  6  . cloNIDine (CATAPRES-TTS-1) 0.1 mg/24hr patch Place 1 patch (0.1 mg total) onto the skin once a week.  4 patch  6  . oxyCODONE-acetaminophen (PERCOCET/ROXICET) 5-325 MG per tablet Take 1-2 tablets by mouth every 6 (six) hours as needed for pain.  60 tablet  0   No facility-administered medications prior to visit.    PE: Blood pressure 169/83, pulse 68, temperature 99.1 F (37.3 C), temperature source Temporal, resp. rate 18, height  5\' 9"  (1.753 m), weight 165 lb (74.844 kg), SpO2 99.00%. Gen: Alert, well appearing.  Patient is oriented to person, place, time, and situation. EARS: both EACs with 100% cerumen impaction.  After removal of cerumen today both TMs appeared normal. CV: RRR, distant S1 and S2, soft systolic murmur without rub or gallop Chest is clear, no wheezing or rales. Normal symmetric air entry throughout both lung fields. No chest wall deformities or tenderness. EXT: no clubbing, cyanosis, or edema.    IMPRESSION AND PLAN:  1) Labile HTN; The current medical regimen is effective;  continue present plan and medications (lopressor 1/2 of 25mg  bid, diovan 160mg  bid). BMET at next f/u in 26mo  2) Hyperlipidemia: stable at last lipid check approx 6 mo ago. Repeat FLP at next f/u in 77mo.  Continue atorvastatin 80 mg qd.  3) Hx of adenomatous colon polyps (Dr. Laural Golden): f/u needed--called their office today and they said they'll need a new referral so this was ordered today.  Appt set for 12/10/13 at 2:45.  4) Tobacco dependence: encouraged cessation yet again.  He declines trial of any rx cessation aid.  5) Cerumen impaction: removed today (100% clear now) with curette (both ears).  An After Visit Summary was printed and given to the patient.  FOLLOW UP: 6 mo ("cpe")

## 2013-11-24 NOTE — Progress Notes (Signed)
Pre visit review using our clinic review tool, if applicable. No additional management support is needed unless otherwise documented below in the visit note. 

## 2013-11-24 NOTE — Patient Instructions (Signed)
GI appt with Dr. Olevia Perches office in [place]: April 1st at 2:45 (arrival).

## 2013-11-25 ENCOUNTER — Telehealth: Payer: Self-pay | Admitting: Family Medicine

## 2013-11-25 NOTE — Telephone Encounter (Signed)
Relevant patient education mailed to patient.  

## 2013-12-10 ENCOUNTER — Telehealth (INDEPENDENT_AMBULATORY_CARE_PROVIDER_SITE_OTHER): Payer: Self-pay | Admitting: *Deleted

## 2013-12-10 ENCOUNTER — Other Ambulatory Visit (INDEPENDENT_AMBULATORY_CARE_PROVIDER_SITE_OTHER): Payer: Self-pay | Admitting: *Deleted

## 2013-12-10 DIAGNOSIS — Z86010 Personal history of colonic polyps: Secondary | ICD-10-CM

## 2013-12-10 DIAGNOSIS — Z1211 Encounter for screening for malignant neoplasm of colon: Secondary | ICD-10-CM

## 2013-12-10 MED ORDER — PEG-KCL-NACL-NASULF-NA ASC-C 100 G PO SOLR: 1.0000 | Freq: Once | ORAL | Status: DC

## 2013-12-10 NOTE — Telephone Encounter (Signed)
Patient needs movi prep 

## 2013-12-22 ENCOUNTER — Other Ambulatory Visit: Payer: Self-pay | Admitting: Vascular Surgery

## 2013-12-22 DIAGNOSIS — Z48812 Encounter for surgical aftercare following surgery on the circulatory system: Secondary | ICD-10-CM

## 2013-12-22 DIAGNOSIS — I6529 Occlusion and stenosis of unspecified carotid artery: Secondary | ICD-10-CM

## 2014-01-06 ENCOUNTER — Ambulatory Visit: Payer: Medicare PPO | Admitting: Vascular Surgery

## 2014-01-06 ENCOUNTER — Encounter: Payer: Self-pay | Admitting: Family

## 2014-01-06 ENCOUNTER — Other Ambulatory Visit (HOSPITAL_COMMUNITY): Payer: Medicare PPO

## 2014-01-07 ENCOUNTER — Ambulatory Visit (INDEPENDENT_AMBULATORY_CARE_PROVIDER_SITE_OTHER): Payer: Medicare PPO | Admitting: Family

## 2014-01-07 ENCOUNTER — Encounter: Payer: Self-pay | Admitting: Family

## 2014-01-07 ENCOUNTER — Ambulatory Visit (HOSPITAL_COMMUNITY)
Admission: RE | Admit: 2014-01-07 | Discharge: 2014-01-07 | Disposition: A | Discharge status: Home / Self Care | Payer: Medicare PPO | Source: Ambulatory Visit | Attending: Family | Admitting: Family

## 2014-01-07 VITALS — BP 159/81 | HR 60 | Resp 14 | Ht 69.0 in | Wt 166.0 lb

## 2014-01-07 DIAGNOSIS — Z48812 Encounter for surgical aftercare following surgery on the circulatory system: Secondary | ICD-10-CM | POA: Insufficient documentation

## 2014-01-07 DIAGNOSIS — I6529 Occlusion and stenosis of unspecified carotid artery: Secondary | ICD-10-CM | POA: Insufficient documentation

## 2014-01-07 NOTE — Addendum Note (Signed)
Addended by: Mena Goes on: 01/07/2014 03:58 PM   Modules accepted: Orders

## 2014-01-07 NOTE — Patient Instructions (Signed)
Stroke Prevention Some medical conditions and behaviors are associated with an increased chance of having a stroke. You may prevent a stroke by making healthy choices and managing medical conditions. HOW CAN I REDUCE MY RISK OF HAVING A STROKE?   Stay physically active. Get at least 30 minutes of activity on most or all days.  Do not smoke. It may also be helpful to avoid exposure to secondhand smoke.  Limit alcohol use. Moderate alcohol use is considered to be:  No more than 2 drinks per day for men.  No more than 1 drink per day for nonpregnant women.  Eat healthy foods. This involves  Eating 5 or more servings of fruits and vegetables a day.  Following a diet that addresses high blood pressure (hypertension), high cholesterol, diabetes, or obesity.  Manage your cholesterol levels.  A diet low in saturated fat, trans fat, and cholesterol and high in fiber may control cholesterol levels.  Take any prescribed medicines to control cholesterol as directed by your health care provider.  Manage your diabetes.  A controlled-carbohydrate, controlled-sugar diet is recommended to manage diabetes.  Take any prescribed medicines to control diabetes as directed by your health care provider.  Control your hypertension.  A low-salt (sodium), low-saturated fat, low-trans fat, and low-cholesterol diet is recommended to manage hypertension.  Take any prescribed medicines to control hypertension as directed by your health care provider.  Maintain a healthy weight.  A reduced-calorie, low-sodium, low-saturated fat, low-trans fat, low-cholesterol diet is recommended to manage weight.  Stop drug abuse.  Avoid taking birth control pills.  Talk to your health care provider about the risks of taking birth control pills if you are over 35 years old, smoke, get migraines, or have ever had a blood clot.  Get evaluated for sleep disorders (sleep apnea).  Talk to your health care provider about  getting a sleep evaluation if you snore a lot or have excessive sleepiness.  Take medicines as directed by your health care provider.  For some people, aspirin or blood thinners (anticoagulants) are helpful in reducing the risk of forming abnormal blood clots that can lead to stroke. If you have the irregular heart rhythm of atrial fibrillation, you should be on a blood thinner unless there is a good reason you cannot take them.  Understand all your medicine instructions.  Make sure that other other conditions (such as anemia or atherosclerosis) are addressed. SEEK IMMEDIATE MEDICAL CARE IF:   You have sudden weakness or numbness of the face, arm, or leg, especially on one side of the body.  Your face or eyelid droops to one side.  You have sudden confusion.  You have trouble speaking (aphasia) or understanding.  You have sudden trouble seeing in one or both eyes.  You have sudden trouble walking.  You have dizziness.  You have a loss of balance or coordination.  You have a sudden, severe headache with no known cause.  You have new chest pain or an irregular heartbeat. Any of these symptoms may represent a serious problem that is an emergency. Do not wait to see if the symptoms will go away. Get medical help at once. Call your local emergency services  (911 in [place] Do not drive yourself to the hospital. Document Released: 10/05/2004 Document Revised: 06/18/2013 Document Reviewed: 02/28/2013 ExitCare Patient Information 2014 [employer].   Smoking Cessation Quitting smoking is important to your health and has many advantages. However, it is not always easy to quit since nicotine is a   very addictive drug. Often times, people try 3 times or more before being able to quit. This document explains the best ways for you to prepare to quit smoking. Quitting takes hard work and a lot of effort, but you can do it. ADVANTAGES OF QUITTING SMOKING  You will live longer, feel better,  and live better.  Your body will feel the impact of quitting smoking almost immediately.  Within 20 minutes, blood pressure decreases. Your pulse returns to its normal level.  After 8 hours, carbon monoxide levels in the blood return to normal. Your oxygen level increases.  After 24 hours, the chance of having a heart attack starts to decrease. Your breath, hair, and body stop smelling like smoke.  After 48 hours, damaged nerve endings begin to recover. Your sense of taste and smell improve.  After 72 hours, the body is virtually free of nicotine. Your bronchial tubes relax and breathing becomes easier.  After 2 to 12 weeks, lungs can hold more air. Exercise becomes easier and circulation improves.  The risk of having a heart attack, stroke, cancer, or lung disease is greatly reduced.  After 1 year, the risk of coronary heart disease is cut in half.  After 5 years, the risk of stroke falls to the same as a nonsmoker.  After 10 years, the risk of lung cancer is cut in half and the risk of other cancers decreases significantly.  After 15 years, the risk of coronary heart disease drops, usually to the level of a nonsmoker.  If you are pregnant, quitting smoking will improve your chances of having a healthy baby.  The people you live with, especially any children, will be healthier.  You will have extra money to spend on things other than cigarettes. QUESTIONS TO THINK ABOUT BEFORE ATTEMPTING TO QUIT You may want to talk about your answers with your caregiver.  Why do you want to quit?  If you tried to quit in the past, what helped and what did not?  What will be the most difficult situations for you after you quit? How will you plan to handle them?  Who can help you through the tough times? Your family? Friends? A caregiver?  What pleasures do you get from smoking? What ways can you still get pleasure if you quit? Here are some questions to ask your caregiver:  How can you  help me to be successful at quitting?  What medicine do you think would be best for me and how should I take it?  What should I do if I need more help?  What is smoking withdrawal like? How can I get information on withdrawal? GET READY  Set a quit date.  Change your environment by getting rid of all cigarettes, ashtrays, matches, and lighters in your home, car, or work. Do not let people smoke in your home.  Review your past attempts to quit. Think about what worked and what did not. GET SUPPORT AND ENCOURAGEMENT You have a better chance of being successful if you have help. You can get support in many ways.  Tell your family, friends, and co-workers that you are going to quit and need their support. Ask them not to smoke around you.  Get individual, group, or telephone counseling and support. Programs are available at local hospitals and health centers. Call your local health department for information about programs in your area.  Spiritual beliefs and practices may help some smokers quit.  Download a "quit meter" on your computer   to keep track of quit statistics, such as how long you have gone without smoking, cigarettes not smoked, and money saved.  Get a self-help book about quitting smoking and staying off of tobacco. LEARN NEW SKILLS AND BEHAVIORS  Distract yourself from urges to smoke. Talk to someone, go for a walk, or occupy your time with a task.  Change your normal routine. Take a different route to work. Drink tea instead of coffee. Eat breakfast in a different place.  Reduce your stress. Take a hot bath, exercise, or read a book.  Plan something enjoyable to do every day. Reward yourself for not smoking.  Explore interactive web-based programs that specialize in helping you quit. GET MEDICINE AND USE IT CORRECTLY Medicines can help you stop smoking and decrease the urge to smoke. Combining medicine with the above behavioral methods and support can greatly increase  your chances of successfully quitting smoking.  Nicotine replacement therapy helps deliver nicotine to your body without the negative effects and risks of smoking. Nicotine replacement therapy includes nicotine gum, lozenges, inhalers, nasal sprays, and skin patches. Some may be available over-the-counter and others require a prescription.  Antidepressant medicine helps people abstain from smoking, but how this works is unknown. This medicine is available by prescription.  Nicotinic receptor partial agonist medicine simulates the effect of nicotine in your brain. This medicine is available by prescription. Ask your caregiver for advice about which medicines to use and how to use them based on your health history. Your caregiver will tell you what side effects to look out for if you choose to be on a medicine or therapy. Carefully read the information on the package. Do not use any other product containing nicotine while using a nicotine replacement product.  RELAPSE OR DIFFICULT SITUATIONS Most relapses occur within the first 3 months after quitting. Do not be discouraged if you start smoking again. Remember, most people try several times before finally quitting. You may have symptoms of withdrawal because your body is used to nicotine. You may crave cigarettes, be irritable, feel very hungry, cough often, get headaches, or have difficulty concentrating. The withdrawal symptoms are only temporary. They are strongest when you first quit, but they will go away within 10 14 days. To reduce the chances of relapse, try to:  Avoid drinking alcohol. Drinking lowers your chances of successfully quitting.  Reduce the amount of caffeine you consume. Once you quit smoking, the amount of caffeine in your body increases and can give you symptoms, such as a rapid heartbeat, sweating, and anxiety.  Avoid smokers because they can make you want to smoke.  Do not let weight gain distract you. Many smokers will gain  weight when they quit, usually less than 10 pounds. Eat a healthy diet and stay active. You can always lose the weight gained after you quit.  Find ways to improve your mood other than smoking. FOR MORE INFORMATION  www.smokefree.gov  Document Released: 08/22/2001 Document Revised: 02/27/2012 Document Reviewed: 12/07/2011 ExitCare Patient Information 2014 [employer].  

## 2014-01-07 NOTE — Progress Notes (Signed)
Established Carotid Patient   History of Present Illness  Jeffrey [name] is a 73 y.o. male patient of Dr. Evelena Leyden with a history of a left CEA in April 2010. He returns today for routine surveillance. He reports that he has had his kidney arteries checked due to uncontrolled hypertension, states this was not the problem; his antihypertensive medications are chronically adjusted.  Patient has Negative history of TIA or stroke symptom.  The patient denies amaurosis fugax or monocular blindness.  The patient  denies facial drooping.  Pt. denies hemiplegia.  The patient denies receptive or expressive aphasia.   patient of Dr. Evelena Leyden with a history of a left CEA in April 2010. Patient denies claudication symptoms in legs with walking, denies non healing wounds. 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  Patient denies New Medical or Surgical History.  Pt Diabetic: No Pt smoker: smoker  (1 ppd, started smoking at about age 89 yrs)  Pt meds include: Statin : Yes ASA: Yes Other anticoagulants/antiplatelets: no   Past Medical History  Diagnosis Date  . Carotid artery occlusion     carotid dopplers 02/06/12-patent left carotid endarterectomy; right internal carotid 40-59%  . Hypertension   . Chronic renal insufficiency, stage II (mild)     Borderline stage II/III--CrCl @ 62.  No RAS on 2003 angiogram (done for + renal artery dopplers--deemed eventually to have been a false positive).  Dr. Gwenlyn Found.  . Hyperlipidemia   . Adenomatous colon polyp     Dr. Laural Golden (28 polyps on 1st endo, 3 on 2nd endo a year later.  He is due for repeat endoscopy as of 12/2012  . BPH (benign prostatic hyperplasia)     Dr. Lum Babe (pt is asymptomatic)  . Plantar wart of left foot 12/21/2012  . Solitary pulmonary nodule 03/14/2013    76m RUL 03/08/13--stable on repeat 07/2013; (next CT 07/2014  due to pt's high risk of cancer (chronic tob  dependence--ongoing)   . DDD (degenerative disc disease), lumbar 02/2013    with spondylosis and moderate spinal stenosis+ left L5 and S1 nerve root impingement  . Fatty liver 2006    Noted on abd u/s and renal u/s 2006/2007  . Aortic insufficiency     echo 03/25/12-EF>55%, mild-mod Aortic regurg, sclerotic aortic valve,   . SOB (shortness of breath)     MET test 03/25/12-mildly abnormal  . Tobacco dependence     Social History History  Substance Use Topics  . Smoking status: Current Every Day Smoker -- 1.00 packs/day for 50 years    Types: Cigarettes  . Smokeless tobacco: Never Used  . Alcohol Use: No    Family History Family History  Problem Relation Age of Onset  . Coronary artery disease Mother   . Stroke Mother   . Diabetes Mother   . Hypertension Mother   . Parkinson's disease Mother     Surgical History Past Surgical History  Procedure Laterality Date  . Carotid endarterectomy  11/25/08    Left     ICA  . Appendectomy  remote  . Arm surgery  July 2010    Left arm cyst/Lipoma  . Umbilical hernia repair    . Tonsillectomy  remote    Allergies  Allergen Reactions  . Clonidine Derivatives Rash    Current Outpatient Prescriptions  Medication Sig Dispense Refill  . aspirin 81 MG tablet Take 81 mg by mouth daily.      .Marland Kitchen  atorvastatin (LIPITOR) 80 MG tablet Take 1 tablet (80 mg total) by mouth daily.  30 tablet  5  . Multiple Vitamins-Minerals (MULTIVITAMIN PO) Take by mouth.      Marland Kitchen omeprazole (PRILOSEC) 20 MG capsule Take 20 mg by mouth daily as needed (heartburn).       . cloNIDine (CATAPRES-TTS-1) 0.1 mg/24hr patch Place 1 patch (0.1 mg total) onto the skin once a week.  4 patch  6  . metoprolol tartrate (LOPRESSOR) 25 MG tablet Take 1 tablet (25 mg total) by mouth 2 (two) times daily. 1/2 tab po bid  60 tablet  1  . metoprolol tartrate (LOPRESSOR) 25 MG tablet take 1 tablet by mouth twice a day  60 tablet  1  . oxyCODONE-acetaminophen (PERCOCET/ROXICET) 5-325 MG  per tablet Take 1-2 tablets by mouth every 6 (six) hours as needed for pain.  60 tablet  0  . peg 3350 powder (MOVIPREP) 100 G SOLR Take 1 kit (200 g total) by mouth once.  1 kit  0  . valsartan (DIOVAN) 160 MG tablet Take 2 tablets (320 mg total) by mouth daily.  60 tablet  6   No current facility-administered medications for this visit.    Review of Systems : See HPI for pertinent positives and negatives.  Physical Examination  Filed Vitals:   01/07/14 1451  BP: 159/81  Pulse: 60  Resp: 14   Filed Weights   01/07/14 1451  Weight: 166 lb (75.297 kg)   Body mass index is 24.5 kg/(m^2).   General: WDWN male in NAD GAIT: normal Eyes: PERRLA Pulmonary:  Non-labored, CTAB, Negative  Rales, Negative rhonchi, & Negative wheezing.  Cardiac: regular Rhythm with occasional premature beats,  Positive detected murmur.  VASCULAR EXAM Carotid Bruits Left Right   Transmitted cardiac murmur Transmitted cardiac murmur    Aorta is not palpable. Radial pulses are 2+ palpable and equal.                                                                                                                            LE Pulses LEFT RIGHT       POPLITEAL  not palpable   not palpable       DORSALIS PEDIS      ANTERIOR TIBIAL 2+ palpable  2+ palpable     Gastrointestinal: soft, nontender, BS WNL, no r/g,  negative masses.  Musculoskeletal: Negative muscle atrophy/wasting. M/S 5/5 throughout, Extremities without ischemic changes  Neurologic: A&O X 3; Appropriate Affect ; SENSATION ;normal;  Speech is normal CN 2-12 intact  Except hearing loss, Pain and light touch intact in extremities, Motor exam as listed above.   Non-Invasive Vascular Imaging CAROTID DUPLEX 01/07/2014   CEREBROVASCULAR DUPLEX EVALUATION    INDICATION: Carotid stenosis    PREVIOUS INTERVENTION(S): Left carotid endarterectomy 12/23/2008.    DUPLEX EXAM:     RIGHT  LEFT  Peak Systolic Velocities (cm/s) End Diastolic  Velocities (cm/s) Plaque LOCATION Peak Systolic  Velocities (cm/s) End Diastolic Velocities (cm/s) Plaque  99 15  CCA PROXIMAL 99 25   106 19  CCA MID 139 30 HT  138 28 HT CCA DISTAL 133 16 HM  126 18 HT ECA 150 19 HM  134 24 HT ICA PROXIMAL 105 18 HM  109 27  ICA MID 83 26   71 18  ICA DISTAL 93 27     1.26 ICA / CCA Ratio (PSV) carotid endarterectomy  Antegrade Vertebral Flow To-Fro/Abnormal  761 Brachial Systolic Pressure (mmHg) 518  Triphasic Brachial Artery Waveforms Triphasic    Plaque Morphology:  HM = Homogeneous, HT = Heterogeneous, CP = Calcific Plaque, SP = Smooth Plaque, IP = Irregular Plaque     ADDITIONAL FINDINGS:     IMPRESSION: Right internal carotid artery stenosis present in the less than 40% range, calcific plaque present which may be underestimating disease and making Doppler interrogation difficult. Left internal carotid artery is patent with history of carotid endarterectomy, mild hyperplasia present at the distal patch suggestive of less than 40% stenosis. Blood pressure gradient present with abnormal left vertebral waveform and dampened left subclavian artery waveform suggestive of a more proximal stenosis.    Compared to the previous exam:  Unchanged since previous study on 01/06/2013.    Assessment: Jeffrey [name] is a 73 y.o. male who presents with asymptomatic minimal bilateral ICA stenosis s/p left CEA in April 2010. Unfortunately he continues to smoke. The  ICA stenosis is  Unchanged from previous exam.  Plan:  Patient was counseled re smoking cessation. Follow-up in 1 year with Carotid Duplex scan.   I discussed in depth with the patient the nature of atherosclerosis, and emphasized the importance of maximal medical management including strict control of blood pressure, blood glucose, and lipid levels, obtaining regular exercise, and cessation of smoking.  The patient is aware that without maximal medical management the underlying atherosclerotic  disease process will progress, limiting the benefit of any interventions. The patient was given information about stroke prevention and what symptoms should prompt the patient to seek immediate medical care. Thank you for allowing [place] to participate in this patient's care.  Clemon Chambers, RN, MSN, FNP-C [facility] Office: [phone]  Clinic Physician: Scot Dock  01/07/2014 2:57 PM

## 2014-01-27 ENCOUNTER — Telehealth (INDEPENDENT_AMBULATORY_CARE_PROVIDER_SITE_OTHER): Payer: Self-pay | Admitting: *Deleted

## 2014-01-27 NOTE — Telephone Encounter (Signed)
agree

## 2014-01-27 NOTE — Telephone Encounter (Signed)
  Procedure: tcs  Reason/Indication:  Hx polyps  Has patient had this procedure before?  Yes, 2007 -- epic  If so, when, by whom and where?    Is there a family history of colon cancer?  no  Who?  What age when diagnosed?    Is patient diabetic?   no      Does patient have prosthetic heart valve?  no  Do you have a pacemaker?  no  Has patient ever had endocarditis? no  Has patient had joint replacement within last 12 months?  no  Does patient tend to be constipated or take laxatives? no  Is patient on Coumadin, Plavix and/or Aspirin? yes  Medications: see EPIC  Allergies: see EPIC  Medication Adjustment: asa 2 days  Procedure date & time: 02/26/14 at 830

## 2014-02-16 ENCOUNTER — Encounter (HOSPITAL_COMMUNITY): Payer: Self-pay | Admitting: Pharmacy Technician

## 2014-02-24 ENCOUNTER — Telehealth: Payer: Self-pay

## 2014-02-24 NOTE — Telephone Encounter (Signed)
Please call Kenney Houseman [phone] patient's daughter (patient doesn't hear very well) about Diovan Rx. Pharmacy is telling her that medicine is going to cost over $200.

## 2014-02-24 NOTE — Telephone Encounter (Signed)
LMOM for pt's daughter to CB.

## 2014-02-24 NOTE — Telephone Encounter (Signed)
Patient's daughter states pt hass not taken his diovan for about 1 week due to cost.  Patient states that his BP readings have been consistently under 140/90 since he has been off of diovan.  However, can we switch this medication or can you recommend a generic Rx for patient.  Please advise.

## 2014-02-25 ENCOUNTER — Other Ambulatory Visit: Payer: Self-pay | Admitting: Family Medicine

## 2014-02-25 MED ORDER — LOSARTAN POTASSIUM 100 MG PO TABS: 100.0000 mg | ORAL_TABLET | Freq: Every day | ORAL | Status: DC

## 2014-02-25 NOTE — Telephone Encounter (Signed)
Generic cozaar (losartan) eRx'd today.  This will replace the diovan.-thx

## 2014-02-25 NOTE — Telephone Encounter (Signed)
Left detailed message on pt's daughter's phone.  Okay per DPR.

## 2014-02-26 ENCOUNTER — Encounter (HOSPITAL_COMMUNITY)
Admission: RE | Disposition: A | Discharge status: Home / Self Care | Payer: Self-pay | Source: Ambulatory Visit | Attending: Internal Medicine

## 2014-02-26 ENCOUNTER — Encounter (HOSPITAL_COMMUNITY): Payer: Self-pay | Admitting: *Deleted

## 2014-02-26 ENCOUNTER — Ambulatory Visit (HOSPITAL_COMMUNITY)
Admission: RE | Admit: 2014-02-26 | Discharge: 2014-02-26 | Disposition: A | Discharge status: Home / Self Care | Payer: Medicare PPO | Source: Ambulatory Visit | Attending: Internal Medicine | Admitting: Internal Medicine

## 2014-02-26 DIAGNOSIS — M51379 Other intervertebral disc degeneration, lumbosacral region without mention of lumbar back pain or lower extremity pain: Secondary | ICD-10-CM | POA: Insufficient documentation

## 2014-02-26 DIAGNOSIS — Z7982 Long term (current) use of aspirin: Secondary | ICD-10-CM | POA: Insufficient documentation

## 2014-02-26 DIAGNOSIS — I6529 Occlusion and stenosis of unspecified carotid artery: Secondary | ICD-10-CM | POA: Insufficient documentation

## 2014-02-26 DIAGNOSIS — Z79899 Other long term (current) drug therapy: Secondary | ICD-10-CM | POA: Insufficient documentation

## 2014-02-26 DIAGNOSIS — M5137 Other intervertebral disc degeneration, lumbosacral region: Secondary | ICD-10-CM | POA: Insufficient documentation

## 2014-02-26 DIAGNOSIS — K644 Residual hemorrhoidal skin tags: Secondary | ICD-10-CM

## 2014-02-26 DIAGNOSIS — N182 Chronic kidney disease, stage 2 (mild): Secondary | ICD-10-CM | POA: Insufficient documentation

## 2014-02-26 DIAGNOSIS — I129 Hypertensive chronic kidney disease with stage 1 through stage 4 chronic kidney disease, or unspecified chronic kidney disease: Secondary | ICD-10-CM | POA: Insufficient documentation

## 2014-02-26 DIAGNOSIS — E785 Hyperlipidemia, unspecified: Secondary | ICD-10-CM | POA: Insufficient documentation

## 2014-02-26 DIAGNOSIS — Z860100 Personal history of colon polyps, unspecified: Secondary | ICD-10-CM | POA: Insufficient documentation

## 2014-02-26 DIAGNOSIS — D126 Benign neoplasm of colon, unspecified: Secondary | ICD-10-CM

## 2014-02-26 DIAGNOSIS — F17200 Nicotine dependence, unspecified, uncomplicated: Secondary | ICD-10-CM | POA: Insufficient documentation

## 2014-02-26 DIAGNOSIS — I359 Nonrheumatic aortic valve disorder, unspecified: Secondary | ICD-10-CM | POA: Insufficient documentation

## 2014-02-26 DIAGNOSIS — Z1211 Encounter for screening for malignant neoplasm of colon: Secondary | ICD-10-CM

## 2014-02-26 HISTORY — PX: COLONOSCOPY: SHX5424

## 2014-02-26 SURGERY — COLONOSCOPY
Anesthesia: Moderate Sedation

## 2014-02-26 MED ORDER — MEPERIDINE HCL 50 MG/ML IJ SOLN
INTRAMUSCULAR | Status: AC
  Filled 2014-02-26: qty 1

## 2014-02-26 MED ORDER — SODIUM CHLORIDE 0.9 % IV SOLN
INTRAVENOUS | Status: DC
Start: 2014-02-26 — End: 2014-02-26
  Administered 2014-02-26: 08:00:00 via INTRAVENOUS

## 2014-02-26 MED ORDER — STERILE WATER FOR IRRIGATION IR SOLN
Status: DC | PRN
  Administered 2014-02-26: 08:00:00

## 2014-02-26 MED ORDER — MIDAZOLAM HCL 5 MG/5ML IJ SOLN
INTRAMUSCULAR | Status: AC
  Filled 2014-02-26: qty 10

## 2014-02-26 MED ORDER — MIDAZOLAM HCL 5 MG/5ML IJ SOLN
INTRAMUSCULAR | Status: DC | PRN
  Administered 2014-02-26: 2 mg via INTRAVENOUS
  Administered 2014-02-26 (×2): 1 mg via INTRAVENOUS
  Administered 2014-02-26: 2 mg via INTRAVENOUS
  Administered 2014-02-26: 1 mg via INTRAVENOUS

## 2014-02-26 MED ORDER — MEPERIDINE HCL 50 MG/ML IJ SOLN
INTRAMUSCULAR | Status: DC | PRN
  Administered 2014-02-26 (×2): 25 mg via INTRAVENOUS

## 2014-02-26 NOTE — Op Note (Signed)
COLONOSCOPY PROCEDURE REPORT  PATIENT:  Jeffrey [name]  MR#:  [id number] Birthdate:  [birth year], 73 y.o., male Endoscopist:  Dr. Rogene Houston, MD Referred By:  Dr. Tammi Sou, M.\D  Procedure Date: 02/26/2014  Procedure:   Colonoscopy with snare polypectomy and clip application.  Indications: Patient is 73 year old Caucasian male with history of multiple colonic adenomas who is returning for surveillance colonoscopy. Last exam was over 7 years ago.  Informed Consent:  The procedure and risks were reviewed with the patient and informed consent was obtained.  Medications:  Demerol 50 mg IV Versed 7 mg IV  Description of procedure:  After a digital rectal exam was performed, that colonoscope was advanced from the anus through the rectum and colon to the area of the cecum, ileocecal valve and appendiceal orifice. The cecum was deeply intubated. These structures were well-seen and photographed for the record. From the level of the cecum and ileocecal valve, the scope was slowly and cautiously withdrawn. The mucosal surfaces were carefully surveyed utilizing scope tip to flexion to facilitate fold flattening as needed. The scope was pulled down into the rectum where a thorough exam including retroflexion was performed.  Findings:   Prep satisfactory. Three small cecal polyps are submitted together. One was cold snared and the other 2 ablated with biopsy forceps. Three polyps were hot snare from region of hepatic flexure and submitted together. These are 6-7 mm in size. 16 mm broad-based polyp noted at proximal transverse colon. Polyp was elevated with submucosal saline injection and removed in pieces. Polypectomy complete. three instinct clips applied to polypectomy site. 53mm polyp hot snared from sigmoid colon. Normal rectal mucosa. Small hemorrhoids below the dentate line.   Therapeutic/Diagnostic Maneuvers Performed:  See above  Complications:  None  Cecal Withdrawal Time:  46  minutes  Impression:  Examination performed to cecum. Three small cecal polyps were removed as above and submitted together. Three 6-7 mm size polyps were hot snared from hepatic flexure and submitted together. 50mm broad-based polyp at proximal transverse colon with irregular shape. Saline-assisted polypectomy performed. Three instinct clips applied to polypectomy site. 55mm polyp hot snared from sigmoid colon. External hemorrhoids.  All in all the patient had 8 polyps.   Recommendations:  Standard instructions given. No aspirin or NSAIDs for 10 days. Patient informed that he cannot undergo an MRI until clips have passed. I will contact patient with biopsy results and further recommendations.  [name],[name]  02/26/2014 9:50 AM  CC: Dr. Tammi Sou, MD & Dr. Rayne Du ref. provider found

## 2014-02-26 NOTE — H&P (Signed)
Jeffrey [name] is an 73 y.o. male.   Chief Complaint: Patient is here for colonoscopy. HPI: Patient is 72 year old Caucasian male who has history of multiple colonic adenomas. Back in 2003 in 2000 for more than 2000 adenomas removed. His last exam was in 2007 with removal of polyps tubular adenomas. He denies abdominal pain change in his bowel habits or rectal bleeding. Family history is negative for CRC.  Past Medical History  Diagnosis Date  . Carotid artery occlusion     carotid dopplers 02/06/12-patent left carotid endarterectomy; right internal carotid 40-59%  . Hypertension   . Chronic renal insufficiency, stage II (mild)     Borderline stage II/III--CrCl @ 62.  No RAS on 2003 angiogram (done for + renal artery dopplers--deemed eventually to have been a false positive).  Dr. Gwenlyn [name].  . Hyperlipidemia   . Adenomatous colon polyp     Dr. Laural [name] (28 polyps on 1st endo, 3 on 2nd endo a year later.  He is due for repeat endoscopy as of 12/2012  . BPH (benign prostatic hyperplasia)     Dr. Lum [name] (pt is asymptomatic)  . Plantar wart of left foot 12/21/2012  . Solitary pulmonary nodule 03/14/2013    47m RUL 03/08/13--stable on repeat 07/2013; (next CT 07/2014  due to pt's high risk of cancer (chronic tob dependence--ongoing)   . DDD (degenerative disc disease), lumbar 02/2013    with spondylosis and moderate spinal stenosis+ left L5 and S1 nerve root impingement  . Fatty liver 2006    Noted on abd [name]/s and renal [name]/s 2006/2007  . Aortic insufficiency     echo 03/25/12-EF>55%, mild-mod Aortic regurg, sclerotic aortic valve,   . SOB (shortness of breath)     MET test 03/25/12-mildly abnormal  . Tobacco dependence   . Colon polyps     Past Surgical History  Procedure Laterality Date  . Carotid endarterectomy  11/25/08    Left     ICA  . Appendectomy  remote  . Arm surgery  July 2010    Left arm cyst/Lipoma  . Umbilical hernia repair    . Tonsillectomy  remote  . Tonsillectomy    . Colonoscopy       Family History  Problem Relation Age of Onset  . Coronary artery disease Mother   . Stroke Mother   . Diabetes Mother   . Hypertension Mother   . Parkinson's disease Mother   . Colon cancer Neg Hx    Social History:  reports that he has been smoking Cigarettes.  He has a 55 pack-year smoking history. He has never used smokeless tobacco. He reports that he does not drink alcohol or use illicit drugs.  Allergies:  Allergies  Allergen Reactions  . Clonidine Derivatives Rash    Medications Prior to Admission  Medication Sig Dispense Refill  . aspirin 81 MG tablet Take 81 mg by mouth daily.      .Marland Kitchenatorvastatin (LIPITOR) 80 MG tablet Take 1 tablet (80 mg total) by mouth daily.  30 tablet  5  . metoprolol tartrate (LOPRESSOR) 25 MG tablet take 1 tablet by mouth twice a day  60 tablet  1  . omeprazole (PRILOSEC) 20 MG capsule Take 20 mg by mouth daily as needed (heartburn).       . losartan (COZAAR) 100 MG tablet Take 1 tablet (100 mg total) by mouth daily.  30 tablet  11    No results [name] for this or any previous visit (from the past  48 hour(s)). No results [name].  ROS  Blood pressure 172/92, pulse 68, temperature 97.9 F (36.6 C), temperature source Oral, resp. rate 18, height 5' 9"  (1.753 m), weight 170 lb (77.111 kg), SpO2 95.00%. Physical Exam  Constitutional: He appears well-developed and well-nourished.  HENT:  Mouth/Throat: Oropharynx is clear and moist.  Eyes: Conjunctivae are normal. No scleral icterus.  Neck: No thyromegaly present.  Cardiovascular: Normal rate, regular rhythm and normal heart sounds.   No murmur heard. Respiratory: Effort normal and breath sounds normal.  GI: Soft. He exhibits no distension and no mass. There is no tenderness.  Musculoskeletal: He exhibits no edema.  Lymphadenopathy:    He has no cervical adenopathy.  Neurological: He is alert.  Skin: Skin is warm and dry.     Assessment/Plan History of multiple colonic  adenomas. Surveillance colonoscopy.  Jeffrey [name] 02/26/2014, 8:26 AM

## 2014-02-26 NOTE — Discharge Instructions (Signed)
No aspirin or NSAIDs for 10 days. Resume usual medications and diet. No driving for 24 hours. Remember not to have MRI until clips have passed. Physician will call with biopsy results.  Colonoscopy, Care After Refer to this sheet in the next few weeks. These instructions provide you with information on caring for yourself after your procedure. Your health care provider may also give you more specific instructions. Your treatment has been planned according to current medical practices, but problems sometimes occur. Call your health care provider if you have any problems or questions after your procedure. WHAT TO EXPECT AFTER THE PROCEDURE  After your procedure, it is typical to have the following:  A small amount of blood in your stool.  Moderate amounts of gas and mild abdominal cramping or bloating. HOME CARE INSTRUCTIONS  Do not drive, operate machinery, or sign important documents for 24 hours.  You may shower and resume your regular physical activities, but move at a slower pace for the first 24 hours.  Take frequent rest periods for the first 24 hours.  Walk around or put a warm pack on your abdomen to help reduce abdominal cramping and bloating.  Drink enough fluids to keep your urine clear or pale yellow.  You may resume your normal diet as instructed by your health care provider. Avoid heavy or fried foods that are hard to digest.  Avoid drinking alcohol for 24 hours or as instructed by your health care provider.  Only take over-the-counter or prescription medicines as directed by your health care provider.  If a tissue sample (biopsy) was taken during your procedure:  Do not take aspirin or blood thinners for 7 days, or as instructed by your health care provider.  Do not drink alcohol for 7 days, or as instructed by your health care provider.  Eat soft foods for the first 24 hours. SEEK MEDICAL CARE IF: You have persistent spotting of blood in your stool 2-3 days  after the procedure. SEEK IMMEDIATE MEDICAL CARE IF:  You have more than a small spotting of blood in your stool.  You pass large blood clots in your stool.  Your abdomen is swollen (distended).  You have nausea or vomiting.  You have a fever.  You have increasing abdominal pain that is not relieved with medicine.   Colon Polyps Polyps are lumps of extra tissue growing inside the body. Polyps can grow in the large intestine (colon). Most colon polyps are noncancerous (benign). However, some colon polyps can become cancerous over time. Polyps that are larger than a pea may be harmful. To be safe, caregivers remove and test all polyps. CAUSES  Polyps form when mutations in the genes cause your cells to grow and divide even though no more tissue is needed. RISK FACTORS There are a number of risk factors that can increase your chances of getting colon polyps. They include:  Being older than 50 years.  Family history of colon polyps or colon cancer.  Long-term colon diseases, such as colitis or Crohn disease.  Being overweight.  Smoking.  Being inactive.  Drinking too much alcohol. SYMPTOMS  Most small polyps do not cause symptoms. If symptoms are present, they may include:  Blood in the stool. The stool may look dark red or black.  Constipation or diarrhea that lasts longer than 1 week. DIAGNOSIS People often do not know they have polyps until their caregiver finds them during a regular checkup. Your caregiver can use 4 tests to check for polyps:  Digital rectal exam. The caregiver wears gloves and feels inside the rectum. This test would find polyps only in the rectum.  Barium enema. The caregiver puts a liquid called barium into your rectum before taking X-rays of your colon. Barium makes your colon look white. Polyps are dark, so they are easy to see in the X-ray pictures.  Sigmoidoscopy. A thin, flexible tube (sigmoidoscope) is placed into your rectum. The  sigmoidoscope has a light and tiny camera in it. The caregiver uses the sigmoidoscope to look at the last third of your colon.  Colonoscopy. This test is like sigmoidoscopy, but the caregiver looks at the entire colon. This is the most common method for finding and removing polyps. TREATMENT  Any polyps will be removed during a sigmoidoscopy or colonoscopy. The polyps are then tested for cancer. PREVENTION  To help lower your risk of getting more colon polyps:  Eat plenty of fruits and vegetables. Avoid eating fatty foods.  Do not smoke.  Avoid drinking alcohol.  Exercise every day.  Lose weight if recommended by your caregiver.  Eat plenty of calcium and folate. Foods that are rich in calcium include milk, cheese, and broccoli. Foods that are rich in folate include chickpeas, kidney beans, and spinach. HOME CARE INSTRUCTIONS Keep all follow-up appointments as directed by your caregiver. You may need periodic exams to check for polyps. SEEK MEDICAL CARE IF: You notice bleeding during a bowel movement.

## 2014-03-02 ENCOUNTER — Encounter (HOSPITAL_COMMUNITY): Payer: Self-pay | Admitting: Internal Medicine

## 2014-07-08 ENCOUNTER — Ambulatory Visit (INDEPENDENT_AMBULATORY_CARE_PROVIDER_SITE_OTHER): Payer: Medicare PPO | Admitting: *Deleted

## 2014-07-08 DIAGNOSIS — Z23 Encounter for immunization: Secondary | ICD-10-CM

## 2014-07-09 ENCOUNTER — Ambulatory Visit: Payer: Medicare PPO

## 2014-09-11 DIAGNOSIS — E538 Deficiency of other specified B group vitamins: Secondary | ICD-10-CM

## 2014-09-11 HISTORY — DX: Deficiency of other specified B group vitamins: E53.8

## 2014-11-12 ENCOUNTER — Ambulatory Visit (INDEPENDENT_AMBULATORY_CARE_PROVIDER_SITE_OTHER): Payer: Medicare PPO | Admitting: Family Medicine

## 2014-11-12 ENCOUNTER — Encounter: Payer: Self-pay | Admitting: Family Medicine

## 2014-11-12 VITALS — BP 175/96 | HR 58 | Temp 99.2°F | Ht 69.0 in | Wt 166.0 lb

## 2014-11-12 DIAGNOSIS — G629 Polyneuropathy, unspecified: Secondary | ICD-10-CM | POA: Diagnosis not present

## 2014-11-12 DIAGNOSIS — G4761 Periodic limb movement disorder: Secondary | ICD-10-CM | POA: Diagnosis not present

## 2014-11-12 DIAGNOSIS — M792 Neuralgia and neuritis, unspecified: Secondary | ICD-10-CM

## 2014-11-12 DIAGNOSIS — G250 Essential tremor: Secondary | ICD-10-CM

## 2014-11-12 DIAGNOSIS — E538 Deficiency of other specified B group vitamins: Secondary | ICD-10-CM

## 2014-11-12 DIAGNOSIS — H6123 Impacted cerumen, bilateral: Secondary | ICD-10-CM

## 2014-11-12 DIAGNOSIS — G4700 Insomnia, unspecified: Secondary | ICD-10-CM

## 2014-11-12 MED ORDER — GABAPENTIN 300 MG PO CAPS: 300.0000 mg | ORAL_CAPSULE | Freq: Three times a day (TID) | ORAL | Status: DC

## 2014-11-12 NOTE — Progress Notes (Signed)
Pre visit review using our clinic review tool, if applicable. No additional management support is needed unless otherwise documented below in the visit note. 

## 2014-11-12 NOTE — Patient Instructions (Signed)
Take one gabapentin 300 mg tab an hour before bedtime every night for 5d, then take one in the morning and continue the one before bedtime for 5d, then take one tab in morning, one in mid afternoon, and 1 at bedtime.  Stay on this dosing until I see you again in 1 month.

## 2014-11-12 NOTE — Progress Notes (Signed)
OFFICE VISIT  11/12/2014   CC:  Chief Complaint  Patient presents with  . Shaking  . Foot Burn  . Insomnia   HPI:    Patient is a 74 y.o. Caucasian male who presents for several complaints today. Of note, it has been a year since I last saw him.    Feet: last 3 mo or so they burn or feel cold on the bottoms--constant, seems to be stable since onset. During day when walking around they are not as bad, but at night he is very bothered by the cold feeling. He says his hands have felt that way for years, get white and very cold in cool temperatures. Says botttoms of feet are pink, don't turn blue.  Last 6 wks, has noted shakiness in hands when they are held up in the air and when holding objects.  When resting hands on something the tremor goes away.  No tremors anywhere else.  His mom and sister both have dx of parkinson's dz per pt's report.  No change in his range of facial epressions, no change in gait, no joint stiffness/rigidity.  Denies signif anxiety, also denies depression.  Problems sleeping lately, about the last 3 mo or so since he has had more problems feeling cold sensation in feet.  Denies restless legs syndrome sx's, but admits his legs jerk uncontrollably sometimes when lying still trying to sleep.  He moves his legs a lot during sleep.    Occ use of OTC sleep aid usually helps.   Past Medical History  Diagnosis Date  . Carotid artery occlusion     carotid dopplers 02/06/12-patent left carotid endarterectomy; right internal carotid 40-59%  . Hypertension   . Chronic renal insufficiency, stage II (mild)     Borderline stage II/III--CrCl @ 62.  No RAS on 2003 angiogram (done for + renal artery dopplers--deemed eventually to have been a false positive).  Dr. Gwenlyn Found.  . Hyperlipidemia   . Adenomatous colon polyp     Dr. Laural Golden (28 polyps on 1st endo, 3 on 2nd endo a year later.  He is due for repeat endoscopy as of 12/2012  . BPH (benign prostatic hyperplasia)     Dr.  Lum Babe (pt is asymptomatic)  . Plantar wart of left foot 12/21/2012  . Solitary pulmonary nodule 03/14/2013    53m RUL 03/08/13--stable on repeat 07/2013; (next CT 07/2014  due to pt's high risk of cancer (chronic tob dependence--ongoing)   . DDD (degenerative disc disease), lumbar 02/2013    with spondylosis and moderate spinal stenosis+ left L5 and S1 nerve root impingement  . Fatty liver 2006    Noted on abd u/s and renal u/s 2006/2007  . Aortic insufficiency     echo 03/25/12-EF>55%, mild-mod Aortic regurg, sclerotic aortic valve,   . SOB (shortness of breath)     MET test 03/25/12-mildly abnormal  . Tobacco dependence   . Hx of adenomatous colonic polyps     Past Surgical History  Procedure Laterality Date  . Carotid endarterectomy  11/25/08    Left     ICA  . Appendectomy  remote  . Arm surgery  July 2010    Left arm cyst/Lipoma  . Umbilical hernia repair    . Tonsillectomy  remote  . Colonoscopy N/A 02/26/2014    (+ polypectomy--tubular adenoma) Procedure: COLONOSCOPY;  Surgeon: NRogene Houston MD;  Location: [facility];  Service: Endoscopy;  Laterality: N/A;  830    Outpatient Prescriptions Prior to Visit  Medication Sig Dispense Refill  . atorvastatin (LIPITOR) 80 MG tablet Take 1 tablet (80 mg total) by mouth daily. 30 tablet 5  . losartan (COZAAR) 100 MG tablet Take 1 tablet (100 mg total) by mouth daily. 30 tablet 11  . metoprolol tartrate (LOPRESSOR) 25 MG tablet take 1 tablet by mouth twice a day 60 tablet 1  . omeprazole (PRILOSEC) 20 MG capsule Take 20 mg by mouth daily as needed (heartburn).      No facility-administered medications prior to visit.    Allergies  Allergen Reactions  . Clonidine Derivatives Rash    ROS As per HPI  PE: Blood pressure 175/96, pulse 58, temperature 99.2 F (37.3 C), temperature source Temporal, height 5' 9"  (1.753 m), weight 166 lb (75.297 kg), SpO2 99 %. Gen: Alert, well appearing.  Patient is oriented to person, place, time,  and situation. YNW:GNFA: no injection, icteris, swelling, or exudate.  EOMI, PERRLA. Mouth: lips without lesion/swelling.  Oral mucosa pink and moist. Oropharynx without erythema, exudate, or swelling. EARS: 100 % hard cerumen impactions AU. Neuro: CN 2-12 intact bilaterally, strength 5/5 in proximal and distal upper extremities and lower extremities bilaterally.    Very fine/mild tremor in hands/fingers when he holds them outstretched in front of him.  This disappears when he rests his hands on his lap.  He does FNF with each hand w/out tremor.  No ataxia.    No pronator drift. Lower legs: normal hair growth, no edema.  DP and PT pulses 2+ bilat.  Feet normal warmth, normal pink color on plantar surface, nails normal.  Sensation intact to monofilament testing, joint position sense intact. ROM of ankles and feet normal. Hands: radial pulses 2+ bilat, hands w/out pallor or rubor.  Normal temp to palpation.  No muscle atrophy. No cogwheel rigidity in arms or legs.  No mask-like facies.  No shuffling gait.  LABS:  None today  IMPRESSION AND PLAN:  1) Peripheral neuropathic pain in bottoms of both feet, relatively new onset. Will check CMET, HbA1c, CBC, vit B12, methylmalonic acid level, ESR, CRP, and SPEP/UPEP. Will start gabapentin 300 mg tid.  Pt instructions: Take one gabapentin 300 mg tab an hour before bedtime every night for 5d, then take one in the morning and continue the one before bedtime for 5d, then take one tab in morning, one in mid afternoon, and 1 at bedtime.  Stay on this dosing until I see you again in 1 month.  2) Essential tremor: no treatment at this time but may add primidone in future.  3) Insomnia; multifactorial ("mind won't shut off", feet bothering him, involuntary leg jerking bothering him). I want to see how he does on his gabapentin before starting anything for this. I think a short acting benzo like restoril may be our best option in his case.  4) Cerumen impaction  bilat: nurses irrigated today but this was unsuccessful. I recommended he use OTC debrox ear drops daily in each ear until we see him back for f/u in 1 mo and maybe we can try irrigation again at that time.  An After Visit Summary was printed and given to the patient.  FOLLOW UP: Return in about 4 weeks (around 12/10/2014) for f/u periph neurop/essential tremor/insomnia.

## 2014-11-13 LAB — COMPREHENSIVE METABOLIC PANEL
ALBUMIN: 4.4 g/dL (ref 3.5–5.2)
ALK PHOS: 89 U/L (ref 39–117)
ALT: 7 U/L (ref 0–53)
AST: 11 U/L (ref 0–37)
BILIRUBIN TOTAL: 0.4 mg/dL (ref 0.2–1.2)
BUN: 13 mg/dL (ref 6–23)
CO2: 33 mEq/L — ABNORMAL HIGH (ref 19–32)
Calcium: 9.8 mg/dL (ref 8.4–10.5)
Chloride: 104 mEq/L (ref 96–112)
Creatinine, Ser: 1.27 mg/dL (ref 0.40–1.50)
GFR: 59.32 mL/min — ABNORMAL LOW (ref >60.00)
GLUCOSE: 92 mg/dL (ref 70–99)
Potassium: 4.3 mEq/L (ref 3.5–5.1)
SODIUM: 141 meq/L (ref 135–145)
Total Protein: 7.5 g/dL (ref 6.0–8.3)

## 2014-11-13 LAB — TSH: TSH: 2.28 u[IU]/mL (ref 0.35–4.50)

## 2014-11-13 LAB — C-REACTIVE PROTEIN: CRP: 0.2 mg/dL — AB (ref 0.5–20.0)

## 2014-11-13 LAB — CBC WITH DIFFERENTIAL/PLATELET
Basophils Absolute: 0.1 10*3/uL (ref 0.0–0.1)
Basophils Relative: 0.6 % (ref 0.0–3.0)
EOS PCT: 2.1 % (ref 0.0–5.0)
Eosinophils Absolute: 0.2 10*3/uL (ref 0.0–0.7)
HCT: 43.1 % (ref 39.0–52.0)
Hemoglobin: 14.9 g/dL (ref 13.0–17.0)
Lymphocytes Relative: 21.2 % (ref 12.0–46.0)
Lymphs Abs: 1.8 10*3/uL (ref 0.7–4.0)
MCHC: 34.7 g/dL (ref 30.0–36.0)
MCV: 85.8 fl (ref 78.0–100.0)
Monocytes Absolute: 0.5 10*3/uL (ref 0.1–1.0)
Monocytes Relative: 5.6 % (ref 3.0–12.0)
Neutro Abs: 6 10*3/uL (ref 1.4–7.7)
Neutrophils Relative %: 70.5 % (ref 43.0–77.0)
PLATELETS: 225 10*3/uL (ref 150.0–400.0)
RBC: 5.03 Mil/uL (ref 4.22–5.81)
RDW: 14.1 % (ref 11.5–15.5)
WBC: 8.5 10*3/uL (ref 4.0–10.5)

## 2014-11-13 LAB — VITAMIN B12: VITAMIN B 12: 250 pg/mL (ref 211–911)

## 2014-11-13 LAB — SEDIMENTATION RATE: Sed Rate: 22 mm/hr (ref 0–22)

## 2014-11-13 LAB — HEMOGLOBIN A1C
Hgb A1c MFr Bld: 5.8 % — ABNORMAL HIGH (ref <5.7)
Mean Plasma Glucose: 120 mg/dL — ABNORMAL HIGH (ref <117)

## 2014-11-15 LAB — METHYLMALONIC ACID, SERUM: METHYLMALONIC ACID, QUANT: 257 nmol/L (ref 87–318)

## 2014-11-16 ENCOUNTER — Other Ambulatory Visit: Payer: Medicare PPO

## 2014-11-16 ENCOUNTER — Telehealth: Payer: Self-pay

## 2014-11-16 DIAGNOSIS — E538 Deficiency of other specified B group vitamins: Secondary | ICD-10-CM

## 2014-11-16 LAB — PROTEIN ELECTROPHORESIS, URINE REFLEX: TOTAL PROTEIN, URINE: 5 mg/dL

## 2014-11-16 NOTE — Telephone Encounter (Signed)
-----   Message from Tammi Sou, MD sent at 11/15/2014 10:06 PM EST ----- Pls try to add on an intrinsic factor level to these labs.  Dx is vit B12 deficiency.-thx

## 2014-11-16 NOTE — Telephone Encounter (Signed)
Patients daughter called and I informed her of the results. Wants to bring him here to get B12 shots. When do I need to make an appointment for this?

## 2014-11-16 NOTE — Telephone Encounter (Signed)
Called and informed daughter of injection schedule. Nurse visit made for 11/17/14 for first injection.

## 2014-11-16 NOTE — Telephone Encounter (Signed)
Any time, for nurse visit: vitamin B12 1000 mcg IM once a week x 4 injections, then he can go to 1000 mcg vit B12 IM q month.  I'll recheck vit B12 level a couple of weeks after his 4th weekly injection.

## 2014-11-17 ENCOUNTER — Ambulatory Visit (INDEPENDENT_AMBULATORY_CARE_PROVIDER_SITE_OTHER): Payer: Medicare PPO | Admitting: Family Medicine

## 2014-11-17 DIAGNOSIS — E538 Deficiency of other specified B group vitamins: Secondary | ICD-10-CM

## 2014-11-17 LAB — PROTEIN ELECTROPHORESIS, SERUM
ALBUMIN ELP: 57.9 % (ref 55.8–66.1)
Alpha-1-Globulin: 4.2 % (ref 2.9–4.9)
Alpha-2-Globulin: 11.7 % (ref 7.1–11.8)
BETA GLOBULIN: 5.7 % (ref 4.7–7.2)
Beta 2: 4.7 % (ref 3.2–6.5)
Gamma Globulin: 15.8 % (ref 11.1–18.8)
TOTAL PROTEIN, SERUM ELECTROPHOR: 7.4 g/dL (ref 6.0–8.3)

## 2014-11-17 LAB — INTRINSIC FACTOR ANTIBODIES: Intrinsic Factor: NEGATIVE

## 2014-11-17 MED ORDER — CYANOCOBALAMIN 1000 MCG/ML IJ SOLN
1000.0000 ug | Freq: Once | INTRAMUSCULAR | Status: AC
  Administered 2014-11-17: 1000 ug via INTRAMUSCULAR

## 2014-11-22 ENCOUNTER — Encounter: Payer: Self-pay | Admitting: Family Medicine

## 2014-11-22 DIAGNOSIS — G629 Polyneuropathy, unspecified: Secondary | ICD-10-CM | POA: Insufficient documentation

## 2014-11-24 ENCOUNTER — Ambulatory Visit (INDEPENDENT_AMBULATORY_CARE_PROVIDER_SITE_OTHER): Payer: Medicare PPO | Admitting: Family Medicine

## 2014-11-24 DIAGNOSIS — E538 Deficiency of other specified B group vitamins: Secondary | ICD-10-CM

## 2014-11-24 MED ORDER — CYANOCOBALAMIN 1000 MCG/ML IJ SOLN
1000.0000 ug | Freq: Once | INTRAMUSCULAR | Status: AC
  Administered 2014-11-24: 1000 ug via INTRAMUSCULAR

## 2014-12-01 ENCOUNTER — Ambulatory Visit (INDEPENDENT_AMBULATORY_CARE_PROVIDER_SITE_OTHER): Payer: Medicare PPO | Admitting: *Deleted

## 2014-12-01 DIAGNOSIS — E538 Deficiency of other specified B group vitamins: Secondary | ICD-10-CM

## 2014-12-01 MED ORDER — CYANOCOBALAMIN 1000 MCG/ML IJ SOLN
1000.0000 ug | Freq: Once | INTRAMUSCULAR | Status: AC
  Administered 2014-12-01: 1000 ug via INTRAMUSCULAR

## 2014-12-08 ENCOUNTER — Ambulatory Visit: Payer: Medicare PPO

## 2014-12-08 ENCOUNTER — Encounter: Payer: Self-pay | Admitting: Family Medicine

## 2014-12-10 ENCOUNTER — Encounter: Payer: Self-pay | Admitting: Family Medicine

## 2014-12-10 ENCOUNTER — Ambulatory Visit (INDEPENDENT_AMBULATORY_CARE_PROVIDER_SITE_OTHER): Payer: Medicare PPO | Admitting: Family Medicine

## 2014-12-10 VITALS — BP 140/88 | HR 56 | Temp 98.6°F | Ht 69.0 in | Wt 167.0 lb

## 2014-12-10 DIAGNOSIS — E538 Deficiency of other specified B group vitamins: Secondary | ICD-10-CM | POA: Insufficient documentation

## 2014-12-10 DIAGNOSIS — G629 Polyneuropathy, unspecified: Secondary | ICD-10-CM

## 2014-12-10 DIAGNOSIS — H6123 Impacted cerumen, bilateral: Secondary | ICD-10-CM

## 2014-12-10 DIAGNOSIS — G250 Essential tremor: Secondary | ICD-10-CM | POA: Diagnosis not present

## 2014-12-10 MED ORDER — PRIMIDONE 50 MG PO TABS: ORAL_TABLET | ORAL | Status: DC

## 2014-12-10 MED ORDER — METOPROLOL TARTRATE 25 MG PO TABS: ORAL_TABLET | ORAL | Status: DC

## 2014-12-10 MED ORDER — CYANOCOBALAMIN 1000 MCG/ML IJ SOLN
1000.0000 ug | Freq: Once | INTRAMUSCULAR | Status: AC
  Administered 2014-12-10: 1000 ug via INTRAMUSCULAR

## 2014-12-10 MED ORDER — LOSARTAN POTASSIUM 100 MG PO TABS: 100.0000 mg | ORAL_TABLET | Freq: Every day | ORAL | Status: DC

## 2014-12-10 NOTE — Progress Notes (Signed)
Pre visit review using our clinic review tool, if applicable. No additional management support is needed unless otherwise documented below in the visit note. 

## 2014-12-10 NOTE — Progress Notes (Signed)
OFFICE NOTE  12/10/2014  CC:  Chief Complaint  Patient presents with  . Follow-up   HPI: Patient is a 74 y.o. Caucasian male who is here for f/u cerumen impactions, possibly need ears cleaned out. Also, here for f/u of recently dx'd vit B12 deficiency (see PMH below for details).   Feet feel much improved, taking gabapentin bid -tid.  Feeling better regarding energy level since getting on vit B12 injections.  Due for 4th weekly injection today, then will go to monthly injections.  Sleep is improved.  He still c/o his UE tremulousness, says this bothers him a lot still.  I have seen him for this in the past and dx'd him with essential tremor.  No med trial as of yet.  Ears feel better since starting daily OTC drops for wax.  Hearing a bit improved.   Pertinent PMH:  Past Medical History  Diagnosis Date  . Carotid artery occlusion     carotid dopplers 02/06/12-patent left carotid endarterectomy; right internal carotid 40-59%  . Hypertension   . Chronic renal insufficiency, stage II (mild)     Borderline stage II/III--CrCl @ 62.  No RAS on 2003 angiogram (done for + renal artery dopplers--deemed eventually to have been a false positive).  Dr. Gwenlyn Found.  . Hyperlipidemia   . Adenomatous colon polyp     Dr. Laural Golden (28 polyps on 1st endo, 3 on 2nd endo a year later.  He is due for repeat endoscopy as of 12/2012  . BPH (benign prostatic hyperplasia)     Dr. Lum Babe (pt is asymptomatic)  . Plantar wart of left foot 12/21/2012  . Solitary pulmonary nodule 03/14/2013    64m RUL 03/08/13--stable on repeat 07/2013; (next CT 07/2014  due to pt's high risk of cancer (chronic tob dependence--ongoing)   . DDD (degenerative disc disease), lumbar 02/2013    with spondylosis and moderate spinal stenosis+ left L5 and S1 nerve root impingement  . Fatty liver 2006    Noted on abd u/s and renal u/s 2006/2007  . Aortic insufficiency     echo 03/25/12-EF>55%, mild-mod Aortic regurg, sclerotic aortic valve,   .  SOB (shortness of breath)     MET test 03/25/12-mildly abnormal  . Tobacco dependence   . Hx of adenomatous colonic polyps   . Vitamin B12 deficiency without anemia 2016    Intrinsic factor NEG: home vit B12 injections started 11/17/14  . Peripheral neuropathic pain 2016    and paresthesias (feet); suspected due to B12 deficiency    MEDS:  Outpatient Prescriptions Prior to Visit  Medication Sig Dispense Refill  . atorvastatin (LIPITOR) 80 MG tablet Take 1 tablet (80 mg total) by mouth daily. 30 tablet 5  . gabapentin (NEURONTIN) 300 MG capsule Take 1 capsule (300 mg total) by mouth 3 (three) times daily. 90 capsule 3  . losartan (COZAAR) 100 MG tablet Take 1 tablet (100 mg total) by mouth daily. 30 tablet 11  . metoprolol tartrate (LOPRESSOR) 25 MG tablet take 1 tablet by mouth twice a day 60 tablet 1  . omeprazole (PRILOSEC) 20 MG capsule Take 20 mg by mouth daily as needed (heartburn).      No facility-administered medications prior to visit.    PE: Blood pressure 140/88, pulse 56, temperature 98.6 F (37 C), temperature source Oral, height 5' 9"  (1.753 m), weight 167 lb (75.751 kg), SpO2 96 %. Gen: Alert, well appearing.  Patient is oriented to person, place, time, and situation. EARS: Right EAC completely clear,  TM normal. Left EAC shows several moderate sized balls of cerumen abutting the TM. CMA April Miller irrigated this EAC and all cerumen was removed, revealing a normal TM.  IMPRESSION AND PLAN:  1) Vit B12 deficiency, w/out anemia.  Intrinsic factor absent/neg. Needs lifelong IM vit B12.   Gave 4th of 4 weekly vit B12 injections today (1000 mcg), and we'll have him return now every month for 1000 mcg injection.  He is feeling better. Will check vit B12 level in 6 wks at next office f/u.  2) Peripheral neuropathy: sx's improved with gabapentin 335m tid and replacement of vit B12.  3) Hx of cerumen impactions: OTC cerumen-dissolving drops have helped.  Left EAC had to be  irrigated today. Pt can hear much better now.  Continue ear drops a couple of times per week.  4) Essential tremor: will start trial of primidone 514mqhs, increase to 5034mid in 10d. Stay on this dosing until I see him for f/u in 6 wks.  An After Visit Summary was printed and given to the patient.  FOLLOW UP: 6 wks

## 2014-12-10 NOTE — Patient Instructions (Signed)
Take 1 primidone tab every night for 10 days, then increase to 1 tab in morning and 1 at night. Stay at this dosing until I see you again in 6 wks.

## 2014-12-14 ENCOUNTER — Telehealth: Payer: Self-pay | Admitting: Family Medicine

## 2014-12-14 NOTE — Telephone Encounter (Signed)
Please advise 

## 2014-12-14 NOTE — Telephone Encounter (Signed)
Patient has completed his 4 B12 injections. When should he get his next B12?

## 2014-12-15 NOTE — Telephone Encounter (Signed)
Patient's wife Izora Gala was notified.

## 2014-12-15 NOTE — Telephone Encounter (Signed)
His most recent B12 injection was 12/10/14, so he should get his next one about 1 mo later (about the end of April) and continue getting them approximately every 30 days.-thx

## 2015-01-12 ENCOUNTER — Ambulatory Visit: Payer: Medicare PPO | Admitting: Family

## 2015-01-12 ENCOUNTER — Other Ambulatory Visit (HOSPITAL_COMMUNITY): Payer: Medicare PPO

## 2015-01-20 ENCOUNTER — Ambulatory Visit (INDEPENDENT_AMBULATORY_CARE_PROVIDER_SITE_OTHER): Payer: Medicare PPO | Admitting: Family Medicine

## 2015-01-20 DIAGNOSIS — E538 Deficiency of other specified B group vitamins: Secondary | ICD-10-CM

## 2015-01-20 MED ORDER — CYANOCOBALAMIN 1000 MCG/ML IJ SOLN
1000.0000 ug | Freq: Once | INTRAMUSCULAR | Status: AC
  Administered 2015-01-20: 1000 ug via INTRAMUSCULAR

## 2015-01-20 MED ORDER — METOPROLOL TARTRATE 25 MG PO TABS: ORAL_TABLET | ORAL | Status: DC

## 2015-02-03 ENCOUNTER — Ambulatory Visit: Payer: Medicare PPO | Admitting: Family

## 2015-02-03 ENCOUNTER — Other Ambulatory Visit (HOSPITAL_COMMUNITY): Payer: Medicare PPO

## 2015-02-16 ENCOUNTER — Telehealth: Payer: Self-pay | Admitting: Family Medicine

## 2015-02-16 NOTE — Telephone Encounter (Signed)
Patient's daughter Kenney Houseman would like to know if patient can come in with his wife Izora Gala tomorrow morning and get his B12 then instead of making a separate trip. Is it okay to get it? It's a little early since the last one was 01/20/15. Please call her.

## 2015-02-17 ENCOUNTER — Ambulatory Visit (INDEPENDENT_AMBULATORY_CARE_PROVIDER_SITE_OTHER): Payer: Medicare PPO | Admitting: Family Medicine

## 2015-02-17 DIAGNOSIS — E538 Deficiency of other specified B group vitamins: Secondary | ICD-10-CM | POA: Diagnosis not present

## 2015-02-17 MED ORDER — CYANOCOBALAMIN 1000 MCG/ML IJ SOLN
1000.0000 ug | Freq: Once | INTRAMUSCULAR | Status: AC
  Administered 2015-02-17: 1000 ug via INTRAMUSCULAR

## 2015-02-17 NOTE — Telephone Encounter (Signed)
This is fine 

## 2015-02-18 ENCOUNTER — Ambulatory Visit: Payer: Medicare PPO

## 2015-02-26 ENCOUNTER — Encounter: Payer: Self-pay | Admitting: Family

## 2015-03-03 ENCOUNTER — Ambulatory Visit (HOSPITAL_COMMUNITY)
Admission: RE | Admit: 2015-03-03 | Discharge: 2015-03-03 | Disposition: A | Discharge status: Home / Self Care | Payer: Medicare PPO | Source: Ambulatory Visit | Attending: Family | Admitting: Family

## 2015-03-03 ENCOUNTER — Ambulatory Visit (INDEPENDENT_AMBULATORY_CARE_PROVIDER_SITE_OTHER): Payer: Medicare PPO | Admitting: Family

## 2015-03-03 ENCOUNTER — Encounter: Payer: Self-pay | Admitting: Family

## 2015-03-03 VITALS — BP 155/79 | HR 71 | Resp 18 | Ht 69.0 in | Wt 162.0 lb

## 2015-03-03 DIAGNOSIS — F17200 Nicotine dependence, unspecified, uncomplicated: Secondary | ICD-10-CM

## 2015-03-03 DIAGNOSIS — Z48812 Encounter for surgical aftercare following surgery on the circulatory system: Secondary | ICD-10-CM

## 2015-03-03 DIAGNOSIS — Z9889 Other specified postprocedural states: Secondary | ICD-10-CM

## 2015-03-03 DIAGNOSIS — Z720 Tobacco use: Secondary | ICD-10-CM | POA: Diagnosis not present

## 2015-03-03 DIAGNOSIS — I6523 Occlusion and stenosis of bilateral carotid arteries: Secondary | ICD-10-CM

## 2015-03-03 NOTE — Patient Instructions (Signed)
Stroke Prevention Some medical conditions and behaviors are associated with an increased chance of having a stroke. You may prevent a stroke by making healthy choices and managing medical conditions. HOW CAN I REDUCE MY RISK OF HAVING A STROKE?   Stay physically active. Get at least 30 minutes of activity on most or all days.  Do not smoke. It may also be helpful to avoid exposure to secondhand smoke.  Limit alcohol use. Moderate alcohol use is considered to be:  No more than 2 drinks per day for men.  No more than 1 drink per day for nonpregnant women.  Eat healthy foods. This involves:  Eating 5 or more servings of fruits and vegetables a day.  Making dietary changes that address high blood pressure (hypertension), high cholesterol, diabetes, or obesity.  Manage your cholesterol levels.  Making food choices that are high in fiber and low in saturated fat, trans fat, and cholesterol may control cholesterol levels.  Take any prescribed medicines to control cholesterol as directed by your health care provider.  Manage your diabetes.  Controlling your carbohydrate and sugar intake is recommended to manage diabetes.  Take any prescribed medicines to control diabetes as directed by your health care provider.  Control your hypertension.  Making food choices that are low in salt (sodium), saturated fat, trans fat, and cholesterol is recommended to manage hypertension.  Take any prescribed medicines to control hypertension as directed by your health care provider.  Maintain a healthy weight.  Reducing calorie intake and making food choices that are low in sodium, saturated fat, trans fat, and cholesterol are recommended to manage weight.  Stop drug abuse.  Avoid taking birth control pills.  Talk to your health care provider about the risks of taking birth control pills if you are over 35 years old, smoke, get migraines, or have ever had a blood clot.  Get evaluated for sleep  disorders (sleep apnea).  Talk to your health care provider about getting a sleep evaluation if you snore a lot or have excessive sleepiness.  Take medicines only as directed by your health care provider.  For some people, aspirin or blood thinners (anticoagulants) are helpful in reducing the risk of forming abnormal blood clots that can lead to stroke. If you have the irregular heart rhythm of atrial fibrillation, you should be on a blood thinner unless there is a good reason you cannot take them.  Understand all your medicine instructions.  Make sure that other conditions (such as anemia or atherosclerosis) are addressed. SEEK IMMEDIATE MEDICAL CARE IF:   You have sudden weakness or numbness of the face, arm, or leg, especially on one side of the body.  Your face or eyelid droops to one side.  You have sudden confusion.  You have trouble speaking (aphasia) or understanding.  You have sudden trouble seeing in one or both eyes.  You have sudden trouble walking.  You have dizziness.  You have a loss of balance or coordination.  You have a sudden, severe headache with no known cause.  You have new chest pain or an irregular heartbeat. Any of these symptoms may represent a serious problem that is an emergency. Do not wait to see if the symptoms will go away. Get medical help at once. Call your local emergency services (911 in [place] Do not drive yourself to the hospital. Document Released: 10/05/2004 Document Revised: 01/12/2014 Document Reviewed: 02/28/2013 ExitCare Patient Information 2015 [employer]. This information is not intended to replace advice given   to you by your health care provider. Make sure you discuss any questions you have with your health care provider.    Smoking Cessation Quitting smoking is important to your health and has many advantages. However, it is not always easy to quit since nicotine is a very addictive drug. Oftentimes, people try 3 times or  more before being able to quit. This document explains the best ways for you to prepare to quit smoking. Quitting takes hard work and a lot of effort, but you can do it. ADVANTAGES OF QUITTING SMOKING  You will live longer, feel better, and live better.  Your body will feel the impact of quitting smoking almost immediately.  Within 20 minutes, blood pressure decreases. Your pulse returns to its normal level.  After 8 hours, carbon monoxide levels in the blood return to normal. Your oxygen level increases.  After 24 hours, the chance of having a heart attack starts to decrease. Your breath, hair, and body stop smelling like smoke.  After 48 hours, damaged nerve endings begin to recover. Your sense of taste and smell improve.  After 72 hours, the body is virtually free of nicotine. Your bronchial tubes relax and breathing becomes easier.  After 2 to 12 weeks, lungs can hold more air. Exercise becomes easier and circulation improves.  The risk of having a heart attack, stroke, cancer, or lung disease is greatly reduced.  After 1 year, the risk of coronary heart disease is cut in half.  After 5 years, the risk of stroke falls to the same as a nonsmoker.  After 10 years, the risk of lung cancer is cut in half and the risk of other cancers decreases significantly.  After 15 years, the risk of coronary heart disease drops, usually to the level of a nonsmoker.  If you are pregnant, quitting smoking will improve your chances of having a healthy baby.  The people you live with, especially any children, will be healthier.  You will have extra money to spend on things other than cigarettes. QUESTIONS TO THINK ABOUT BEFORE ATTEMPTING TO QUIT You may want to talk about your answers with your health care provider.  Why do you want to quit?  If you tried to quit in the past, what helped and what did not?  What will be the most difficult situations for you after you quit? How will you plan to  handle them?  Who can help you through the tough times? Your family? Friends? A health care provider?  What pleasures do you get from smoking? What ways can you still get pleasure if you quit? Here are some questions to ask your health care provider:  How can you help me to be successful at quitting?  What medicine do you think would be best for me and how should I take it?  What should I do if I need more help?  What is smoking withdrawal like? How can I get information on withdrawal? GET READY  Set a quit date.  Change your environment by getting rid of all cigarettes, ashtrays, matches, and lighters in your home, car, or work. Do not let people smoke in your home.  Review your past attempts to quit. Think about what worked and what did not. GET SUPPORT AND ENCOURAGEMENT You have a better chance of being successful if you have help. You can get support in many ways.  Tell your family, friends, and coworkers that you are going to quit and need their support. Ask them   not to smoke around you.  Get individual, group, or telephone counseling and support. Programs are available at local hospitals and health centers. Call your local health department for information about programs in your area.  Spiritual beliefs and practices may help some smokers quit.  Download a "quit meter" on your computer to keep track of quit statistics, such as how long you have gone without smoking, cigarettes not smoked, and money saved.  Get a self-help book about quitting smoking and staying off tobacco. LEARN NEW SKILLS AND BEHAVIORS  Distract yourself from urges to smoke. Talk to someone, go for a walk, or occupy your time with a task.  Change your normal routine. Take a different route to work. Drink tea instead of coffee. Eat breakfast in a different place.  Reduce your stress. Take a hot bath, exercise, or read a book.  Plan something enjoyable to do every day. Reward yourself for not  smoking.  Explore interactive web-based programs that specialize in helping you quit. GET MEDICINE AND USE IT CORRECTLY Medicines can help you stop smoking and decrease the urge to smoke. Combining medicine with the above behavioral methods and support can greatly increase your chances of successfully quitting smoking.  Nicotine replacement therapy helps deliver nicotine to your body without the negative effects and risks of smoking. Nicotine replacement therapy includes nicotine gum, lozenges, inhalers, nasal sprays, and skin patches. Some may be available over-the-counter and others require a prescription.  Antidepressant medicine helps people abstain from smoking, but how this works is unknown. This medicine is available by prescription.  Nicotinic receptor partial agonist medicine simulates the effect of nicotine in your brain. This medicine is available by prescription. Ask your health care provider for advice about which medicines to use and how to use them based on your health history. Your health care provider will tell you what side effects to look out for if you choose to be on a medicine or therapy. Carefully read the information on the package. Do not use any other product containing nicotine while using a nicotine replacement product.  RELAPSE OR DIFFICULT SITUATIONS Most relapses occur within the first 3 months after quitting. Do not be discouraged if you start smoking again. Remember, most people try several times before finally quitting. You may have symptoms of withdrawal because your body is used to nicotine. You may crave cigarettes, be irritable, feel very hungry, cough often, get headaches, or have difficulty concentrating. The withdrawal symptoms are only temporary. They are strongest when you first quit, but they will go away within 10-14 days. To reduce the chances of relapse, try to:  Avoid drinking alcohol. Drinking lowers your chances of successfully quitting.  Reduce the  amount of caffeine you consume. Once you quit smoking, the amount of caffeine in your body increases and can give you symptoms, such as a rapid heartbeat, sweating, and anxiety.  Avoid smokers because they can make you want to smoke.  Do not let weight gain distract you. Many smokers will gain weight when they quit, usually less than 10 pounds. Eat a healthy diet and stay active. You can always lose the weight gained after you quit.  Find ways to improve your mood other than smoking. FOR MORE INFORMATION  www.smokefree.gov  Document Released: 08/22/2001 Document Revised: 01/12/2014 Document Reviewed: 12/07/2011 ExitCare Patient Information 2015 [employer]. This information is not intended to replace advice given to you by your health care provider. Make sure you discuss any questions you have with your health   care provider.    Smoking Cessation, Tips for Success If you are ready to quit smoking, congratulations! You have chosen to help yourself be healthier. Cigarettes bring nicotine, tar, carbon monoxide, and other irritants into your body. Your lungs, heart, and blood vessels will be able to work better without these poisons. There are many different ways to quit smoking. Nicotine gum, nicotine patches, a nicotine inhaler, or nicotine nasal spray can help with physical craving. Hypnosis, support groups, and medicines help break the habit of smoking. WHAT THINGS CAN I DO TO MAKE QUITTING EASIER?  Here are some tips to help you quit for good:  Pick a date when you will quit smoking completely. Tell all of your friends and family about your plan to quit on that date.  Do not try to slowly cut down on the number of cigarettes you are smoking. Pick a quit date and quit smoking completely starting on that day.  Throw away all cigarettes.   Clean and remove all ashtrays from your home, work, and car.  On a card, write down your reasons for quitting. Carry the card with you and read it  when you get the urge to smoke.  Cleanse your body of nicotine. Drink enough water and fluids to keep your urine clear or pale yellow. Do this after quitting to flush the nicotine from your body.  Learn to predict your moods. Do not let a bad situation be your excuse to have a cigarette. Some situations in your life might tempt you into wanting a cigarette.  Never have "just one" cigarette. It leads to wanting another and another. Remind yourself of your decision to quit.  Change habits associated with smoking. If you smoked while driving or when feeling stressed, try other activities to replace smoking. Stand up when drinking your coffee. Brush your teeth after eating. Sit in a different chair when you read the paper. Avoid alcohol while trying to quit, and try to drink fewer caffeinated beverages. Alcohol and caffeine may urge you to smoke.  Avoid foods and drinks that can trigger a desire to smoke, such as sugary or spicy foods and alcohol.  Ask people who smoke not to smoke around you.  Have something planned to do right after eating or having a cup of coffee. For example, plan to take a walk or exercise.  Try a relaxation exercise to calm you down and decrease your stress. Remember, you may be tense and nervous for the first 2 weeks after you quit, but this will pass.  Find new activities to keep your hands busy. Play with a pen, coin, or rubber band. Doodle or draw things on paper.  Brush your teeth right after eating. This will help cut down on the craving for the taste of tobacco after meals. You can also try mouthwash.   Use oral substitutes in place of cigarettes. Try using lemon drops, carrots, cinnamon sticks, or chewing gum. Keep them handy so they are available when you have the urge to smoke.  When you have the urge to smoke, try deep breathing.  Designate your home as a nonsmoking area.  If you are a heavy smoker, ask your health care provider about a prescription for  nicotine chewing gum. It can ease your withdrawal from nicotine.  Reward yourself. Set aside the cigarette money you save and buy yourself something nice.  Look for support from others. Join a support group or smoking cessation program. Ask someone at home or at work   to help you with your plan to quit smoking.  Always ask yourself, "Do I need this cigarette or is this just a reflex?" Tell yourself, "Today, I choose not to smoke," or "I do not want to smoke." You are reminding yourself of your decision to quit.  Do not replace cigarette smoking with electronic cigarettes (commonly called e-cigarettes). The safety of e-cigarettes is unknown, and some may contain harmful chemicals.  If you relapse, do not give up! Plan ahead and think about what you will do the next time you get the urge to smoke. HOW WILL I FEEL WHEN I QUIT SMOKING? You may have symptoms of withdrawal because your body is used to nicotine (the addictive substance in cigarettes). You may crave cigarettes, be irritable, feel very hungry, cough often, get headaches, or have difficulty concentrating. The withdrawal symptoms are only temporary. They are strongest when you first quit but will go away within 10-14 days. When withdrawal symptoms occur, stay in control. Think about your reasons for quitting. Remind yourself that these are signs that your body is healing and getting used to being without cigarettes. Remember that withdrawal symptoms are easier to treat than the major diseases that smoking can cause.  Even after the withdrawal is over, expect periodic urges to smoke. However, these cravings are generally short lived and will go away whether you smoke or not. Do not smoke! WHAT RESOURCES ARE AVAILABLE TO HELP ME QUIT SMOKING? Your health care provider can direct you to community resources or hospitals for support, which may include:  Group support.  Education.  Hypnosis.  Therapy. Document Released: 05/26/2004 Document  Revised: 01/12/2014 Document Reviewed: 02/13/2013 ExitCare Patient Information 2015 [employer]. This information is not intended to replace advice given to you by your health care provider. Make sure you discuss any questions you have with your health care provider.  

## 2015-03-03 NOTE — Progress Notes (Signed)
Established Carotid Patient   History of Present Illness  Jeffrey [name] is a 74 y.o. male patient of Dr. Kellie Simmering with a history of a left CEA in April 2010. He returns today for routine surveillance. He reports that he has had his kidney arteries checked due to uncontrolled hypertension, states this was not the problem; his antihypertensive medications are chronically adjusted.  Patient has Negative history of TIA or stroke symptom. The patient denies amaurosis fugax or monocular blindness. The patient denies facial drooping. Pt. denies hemiplegia. The patient denies receptive or expressive aphasia.   Patient denies claudication symptoms in legs with walking, denies non healing wounds. 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 Patient denies New Medical or Surgical History.  Pt Diabetic: No Pt smoker: smoker (2/3 ppd, started smoking at about age 40 yrs)  Pt meds include: Statin : Yes ASA: has forgotten to take daily 81 mg ASA, but does take Goody's powder almost daily for almost daily headaches, states he has told his PCP this who advised against this per pt.  Other anticoagulants/antiplatelets: no  Past Medical History  Diagnosis Date  . Carotid artery occlusion     carotid dopplers 02/06/12-patent left carotid endarterectomy; right internal carotid 40-59%  . Hypertension   . Chronic renal insufficiency, stage II (mild)     Borderline stage II/III--CrCl @ 62.  No RAS on 2003 angiogram (done for + renal artery dopplers--deemed eventually to have been a false positive).  Dr. Gwenlyn Found.  . Hyperlipidemia   . Adenomatous colon polyp     Dr. Laural Golden (28 polyps on 1st endo, 3 on 2nd endo a year later.  He is due for repeat endoscopy as of 12/2012  . BPH (benign prostatic hyperplasia)     Dr. Lum Babe (pt is asymptomatic)  . Plantar wart of left foot 12/21/2012  . Solitary pulmonary nodule 03/14/2013    89m RUL  03/08/13--stable on repeat 07/2013; (next CT 07/2014  due to pt's high risk of cancer (chronic tob dependence--ongoing)   . DDD (degenerative disc disease), lumbar 02/2013    with spondylosis and moderate spinal stenosis+ left L5 and S1 nerve root impingement  . Fatty liver 2006    Noted on abd u/s and renal u/s 2006/2007  . Aortic insufficiency     echo 03/25/12-EF>55%, mild-mod Aortic regurg, sclerotic aortic valve,   . SOB (shortness of breath)     MET test 03/25/12-mildly abnormal  . Tobacco dependence   . Hx of adenomatous colonic polyps   . Vitamin B12 deficiency without anemia 2016    Intrinsic factor NEG: home vit B12 injections started 11/17/14  . Peripheral neuropathic pain 2016    and paresthesias (feet); suspected due to B12 deficiency  . Essential tremor     Social History History  Substance Use Topics  . Smoking status: Current Every Day Smoker -- 1.00 packs/day for 55 years    Types: Cigarettes  . Smokeless tobacco: Never Used  . Alcohol Use: No    Family History Family History  Problem Relation Age of Onset  . Coronary artery disease Mother   . Stroke Mother   . Diabetes Mother   . Hypertension Mother   . Parkinson's disease Mother   . Colon cancer Neg Hx     Surgical History Past Surgical History  Procedure Laterality Date  . Carotid endarterectomy  11/25/08    Left     ICA  .  Appendectomy  remote  . Arm surgery  July 2010    Left arm cyst/Lipoma  . Umbilical hernia repair    . Tonsillectomy  remote  . Colonoscopy N/A 02/26/2014    (+ polypectomy--tubular adenoma) Procedure: COLONOSCOPY;  Surgeon: Rogene Houston, MD;  Location: [facility];  Service: Endoscopy;  Laterality: N/A;  830    Allergies  Allergen Reactions  . Clonidine Derivatives Rash    Current Outpatient Prescriptions  Medication Sig Dispense Refill  . atorvastatin (LIPITOR) 80 MG tablet Take 1 tablet (80 mg total) by mouth daily. 30 tablet 5  . gabapentin (NEURONTIN) 300 MG capsule  Take 1 capsule (300 mg total) by mouth 3 (three) times daily. 90 capsule 3  . losartan (COZAAR) 100 MG tablet Take 1 tablet (100 mg total) by mouth daily. 30 tablet 11  . metoprolol tartrate (LOPRESSOR) 25 MG tablet take 1 tablet by mouth twice a day 60 tablet 3  . omeprazole (PRILOSEC) 20 MG capsule Take 20 mg by mouth daily as needed (heartburn).     . primidone (MYSOLINE) 50 MG tablet 1 tab po bid 60 tablet 1   No current facility-administered medications for this visit.    Review of Systems : See HPI for pertinent positives and negatives.  Physical Examination  Filed Vitals:   03/03/15 1457 03/03/15 1459 03/03/15 1501  BP: 143/87 143/83 155/79  Pulse: 68 60 71  Resp: 18    Height: _0  (1.753 m)    Weight: 162 lb (73.483 kg)     Body mass index is 23.91 kg/(m^2).  General: WDWN male in NAD GAIT: normal Eyes: PERRLA Pulmonary: Non-labored, CTAB, Negative Rales, Negative rhonchi, & Negative wheezing.  Cardiac: regular Rhythm with occasional premature beats, Positive detected murmur.  VASCULAR EXAM Carotid Bruits Left Right   Transmitted cardiac murmur Transmitted cardiac murmur   Aorta is not palpable. Radial pulses are 2+ palpable and equal.      LE Pulses LEFT RIGHT   POPLITEAL not palpable  not palpable   DORSALIS PEDIS  ANTERIOR TIBIAL 2+ palpable  2+ palpable     Gastrointestinal: soft, nontender, BS WNL, no r/g,no palable masses.  Musculoskeletal: Negative muscle atrophy/wasting. M/S 5/5 throughout, Extremities without ischemic changes  Neurologic: A&O X 3; Appropriate Affect, Speech is normal CN 2-12 intact except some hearing loss, Pain and light touch intact in extremities, Motor exam as listed above.         Non-Invasive Vascular Imaging CAROTID DUPLEX 03/03/2015   CEREBROVASCULAR  DUPLEX EVALUATION    INDICATION: Carotid artery disease     PREVIOUS INTERVENTION(S): Left carotid endarterectomy 12/23/2008    DUPLEX EXAM:     RIGHT  LEFT  Peak Systolic Velocities (cm/s) End Diastolic Velocities (cm/s) Plaque LOCATION Peak Systolic Velocities (cm/s) End Diastolic Velocities (cm/s) Plaque  77 16  CCA PROXIMAL 84 15   109 20  CCA MID 87 15 HT  119 23 HT CCA DISTAL 118 21 HM  145 11 HT ECA 95 9 HM  120 24 HT ICA PROXIMAL 146 43 HM  100 23  ICA MID 78 21   78 21  ICA DISTAL 89 25     1.10 ICA / CCA Ratio (PSV) NA  Antegrade  Vertebral Flow Antegrade/abnormal   517 Brachial Systolic Pressure (mmHg) 616  Multiphasic (Subclavian artery) Brachial Artery Waveforms Multiphasic (Subclavian artery)    Plaque Morphology:  HM = Homogeneous, HT = Heterogeneous, CP = Calcific Plaque, SP = Smooth Plaque, IP =  Irregular Plaque  ADDITIONAL FINDINGS:     IMPRESSION: Right internal carotid artery velocities suggest a <40% stenosis. Patent left carotid endarterectomy site with evidence of mild hyperplasia present at the distal patch site suggestive of a 40-59% stenosis.      Compared to the previous exam:        Assessment: Jeffrey [name] is a 74 y.o. male who is s/p  left CEA in April 2010. He has no history of stroke of TIA. Today's carotid Duplex suggests minimal right ICA stenosis and a patent left carotid endarterectomy site with evidence of mild hyperplasia present at the distal patch site suggestive of a 40-59% stenosis. The right ICA stenosis is unchanged from 01/07/14, the left ICA stenosis has increased compared to previous Duplex.  Pt advised to stop the almost daily Goody's powder use, discussed renal and GI long term effects, and advised to resume the daily 81 mg ASA to reduce his risk of CVD events. Unfortunately he continues to smoke, see Plan.  Plan:  The patient was counseled re smoking cessation and given several free resources re smoking  cessation.  Follow-up in 1 year with Carotid Duplex.   I discussed in depth with the patient the nature of atherosclerosis, and emphasized the importance of maximal medical management including strict control of blood pressure, blood glucose, and lipid levels, obtaining regular exercise, and cessation of smoking.  The patient is aware that without maximal medical management the underlying atherosclerotic disease process will progress, limiting the benefit of any interventions. The patient was given information about stroke prevention and what symptoms should prompt the patient to seek immediate medical care. Thank you for allowing [place] to participate in this patient's care.  Clemon Chambers, RN, MSN, FNP-C [facility] Office: [phone]  Clinic Physician: Scot Dock  03/03/2015 3:24 PM

## 2015-03-09 ENCOUNTER — Other Ambulatory Visit: Payer: Self-pay | Admitting: Family Medicine

## 2015-03-09 NOTE — Telephone Encounter (Signed)
RF request for primidone.  LOV: 12/10/14 Next ov: 03/19/15 Last written: 12/10/14 #180 w/ 1RF

## 2015-03-10 ENCOUNTER — Ambulatory Visit (INDEPENDENT_AMBULATORY_CARE_PROVIDER_SITE_OTHER): Payer: Medicare PPO | Admitting: Nurse Practitioner

## 2015-03-10 ENCOUNTER — Encounter: Payer: Self-pay | Admitting: Nurse Practitioner

## 2015-03-10 VITALS — BP 142/80 | HR 63 | Temp 98.1°F | Resp 18 | Ht 69.0 in | Wt 165.0 lb

## 2015-03-10 DIAGNOSIS — B880 Other acariasis: Secondary | ICD-10-CM | POA: Diagnosis not present

## 2015-03-10 MED ORDER — METHYLPREDNISOLONE ACETATE 40 MG/ML IJ SUSP
40.0000 mg | Freq: Once | INTRAMUSCULAR | Status: AC
Start: 2015-03-10 — End: 2015-03-10
  Administered 2015-03-10: 40 mg via INTRA_ARTICULAR

## 2015-03-10 MED ORDER — PREDNISONE 10 MG PO TABS: ORAL_TABLET | ORAL | Status: DC

## 2015-03-10 NOTE — Progress Notes (Signed)
Subjective:     Jeffrey [name] is a 74 y.o. male who presents for evaluation of a rash involving the forearm. Rash started 3 days ago. Lesions are pink, and raised in texture. Rash has changed over time-more swollen & pruritic-waking in night scratching. Associated symptoms: none. Patient has not had contacts with similar rash. Patient has had new exposures-cutting & hauling brush.  The following portions of the patient's history were reviewed and updated as appropriate: allergies, current medications, past medical history, past social history, past surgical history and problem list.  Review of Systems Pertinent items are noted in HPI.    Objective:    BP 142/80 mmHg  Pulse 63  Temp(Src) 98.1 F (36.7 C) (Temporal)  Resp 18  Ht 5\' 9"  (1.753 m)  Wt 165 lb (74.844 kg)  BMI 24.36 kg/m2  SpO2 98% General:  alert, cooperative, appears stated age and no distress  Skin:  normal and multiple welts on insides of forearms-some have tiny black thorns-picked out 4 w/bevel edge of needle. Has few abrasions in some welts where he scratched. No signs of infection.      Assessment:Plan   1. Chigger bites DD: inflammatory reaction to briars - predniSONE (DELTASONE) 10 MG tablet; Starting tomorrow, Take 3Tpo qam X 2d, then 2T po qam X 2d, then 1 po qd X 2d  Dispense: 12 tablet; Refill: 0 - methylPREDNISolone acetate (DEPO-MEDROL) injection 40 mg; Inject 1 mL (40 mg total) into the articular space once. Ice packs for itching, benadryl cream. Skin care discussed F/u PRN

## 2015-03-10 NOTE — Patient Instructions (Signed)
Start prednisone tomorrow. Take in mornings so it doesn't keep you awake.  Keep lesions clean: wash with soap & water twice daily & apply thin layer bacitracin ointment.  If you have chiggers, it will take about 14 days for rash to go away.   Apply ice packs rather than scratching.  You can apply benadryl cream to itchy spots.

## 2015-03-10 NOTE — Progress Notes (Signed)
Pre visit review using our clinic review tool, if applicable. No additional management support is needed unless otherwise documented below in the visit note. 

## 2015-03-18 ENCOUNTER — Encounter: Payer: Self-pay | Admitting: Family Medicine

## 2015-03-18 ENCOUNTER — Ambulatory Visit (INDEPENDENT_AMBULATORY_CARE_PROVIDER_SITE_OTHER): Payer: Medicare PPO | Admitting: Family Medicine

## 2015-03-18 VITALS — BP 124/75 | HR 66 | Temp 98.4°F | Resp 16 | Ht 69.0 in | Wt 161.0 lb

## 2015-03-18 DIAGNOSIS — H6123 Impacted cerumen, bilateral: Secondary | ICD-10-CM

## 2015-03-18 DIAGNOSIS — E785 Hyperlipidemia, unspecified: Secondary | ICD-10-CM | POA: Diagnosis not present

## 2015-03-18 DIAGNOSIS — E538 Deficiency of other specified B group vitamins: Secondary | ICD-10-CM | POA: Diagnosis not present

## 2015-03-18 DIAGNOSIS — R911 Solitary pulmonary nodule: Secondary | ICD-10-CM

## 2015-03-18 DIAGNOSIS — I10 Essential (primary) hypertension: Secondary | ICD-10-CM

## 2015-03-18 DIAGNOSIS — F17200 Nicotine dependence, unspecified, uncomplicated: Secondary | ICD-10-CM

## 2015-03-18 DIAGNOSIS — G250 Essential tremor: Secondary | ICD-10-CM | POA: Insufficient documentation

## 2015-03-18 MED ORDER — ATORVASTATIN CALCIUM 80 MG PO TABS: 80.0000 mg | ORAL_TABLET | Freq: Every day | ORAL | Status: DC

## 2015-03-18 MED ORDER — CYANOCOBALAMIN 1000 MCG/ML IJ SOLN
1000.0000 ug | Freq: Once | INTRAMUSCULAR | Status: AC
  Administered 2015-03-18: 1000 ug via INTRAMUSCULAR

## 2015-03-18 NOTE — Progress Notes (Signed)
Pre visit review using our clinic review tool, if applicable. No additional management support is needed unless otherwise documented below in the visit note. 

## 2015-03-18 NOTE — Progress Notes (Signed)
OFFICE NOTE  03/18/2015  CC:  Chief Complaint  Patient presents with  . Follow-up  . B12 Injection   HPI: Patient is a 74 y.o. Caucasian male who is here for 4 mo f/u vit B12 def, essential tremor, HTN, CRI stage II/III. Says he feels good energy level since getting on  B12 inj. Says essential tremor not much different than before I started him on primodone. Has hx of pulm nodule, is a bit overdue for a f/u CT chest to follow this--he continues to smoke.  Pertinent PMH:  Past medical, surgical, social, and family history reviewed and no changes are noted since last office visit.  MEDS: Not taking prednisone listed below Outpatient Prescriptions Prior to Visit  Medication Sig Dispense Refill  . gabapentin (NEURONTIN) 300 MG capsule Take 1 capsule (300 mg total) by mouth 3 (three) times daily. 90 capsule 3  . losartan (COZAAR) 100 MG tablet Take 1 tablet (100 mg total) by mouth daily. 30 tablet 11  . metoprolol tartrate (LOPRESSOR) 25 MG tablet take 1 tablet by mouth twice a day 60 tablet 3  . omeprazole (PRILOSEC) 20 MG capsule Take 20 mg by mouth daily as needed (heartburn).     . primidone (MYSOLINE) 50 MG tablet TAKE 1 TABLET BY MOUTH TWICE DAILY 180 tablet 1  . atorvastatin (LIPITOR) 80 MG tablet Take 1 tablet (80 mg total) by mouth daily. 30 tablet 5  . predniSONE (DELTASONE) 10 MG tablet Starting tomorrow, Take 3Tpo qam X 2d, then 2T po qam X 2d, then 1 po qd X 2d (Patient not taking: Reported on 03/18/2015) 12 tablet 0   No facility-administered medications prior to visit.    PE: Blood pressure 124/75, pulse 66, temperature 98.4 F (36.9 C), temperature source Oral, resp. rate 16, height 5\' 9"  (1.753 m), weight 161 lb (73.029 kg), SpO2 98 %. Gen: Alert, well appearing.  Patient is oriented to person, place, time, and situation. No signif tremor with hands held outstretched in front of him.  No tremor with hands resting on lap, no tremor with FNF. EARS: R EAC with cerumen  completely filling distal canal, preventing any view of TM. L EAC with diffuse white substance interspersed on the epithelial surface, with more of a collection of the substance in the distal canal, and TM cannot be viewed due to this substance. Both canals irrigated by nurse today.  LABS:  Lab Results  Component Value Date   CHOL 124 06/26/2013   HDL 33.80* 06/26/2013   LDLCALC 71 06/26/2013   TRIG 98.0 06/26/2013   CHOLHDL 4 06/26/2013     Chemistry      Component Value Date/Time   NA 141 11/12/2014 1649   K 4.3 11/12/2014 1649   CL 104 11/12/2014 1649   CO2 33* 11/12/2014 1649   BUN 13 11/12/2014 1649   CREATININE 1.27 11/12/2014 1649   CREATININE 1.19 07/04/2013 1115      Component Value Date/Time   CALCIUM 9.8 11/12/2014 1649   ALKPHOS 89 11/12/2014 1649   AST 11 11/12/2014 1649   ALT 7 11/12/2014 1649   BILITOT 0.4 11/12/2014 1649     Lab Results  Component Value Date   WBC 8.5 11/12/2014   HGB 14.9 11/12/2014   HCT 43.1 11/12/2014   MCV 85.8 11/12/2014   PLT 225.0 11/12/2014     IMPRESSION AND PLAN:  1) HTN; The current medical regimen is effective;  continue present plan and medications.  2) CRI stage II/III; stable  labs 4 mo ago. We'll recheck these at next f/u visit in 6 mo.  3) Hyperlipidemia: needs to get back on his statin: rx for lipitor 80mg  qd sent today.  4) Vit B12 def: improved.  1000 mcg vit B12 IM today.  5) Essential tremor: improved on primidone 50mg  bid dosing.  Continue this dose for now but we have room to go up in future if needed (would go to 100mg  bid).  6) R EAC cerumen impaction (recurrent): cleared by nurse today. L EAC with possible fungal infection? But only a scant amount of this white substance was left after ear was irrigated, so I held off on any treatment at this time.  7) Solitary pulm nodule; due for surveillance CT as recommended by radiologist.  Ordered this to be done at [facility] radiology.  8) tob dependence:  encouraged complete cessation but pt not contemplating a quit attempt at this time.  An After Visit Summary was printed and given to the patient.  FOLLOW UP: 6 mo

## 2015-03-19 ENCOUNTER — Ambulatory Visit: Payer: Medicare PPO | Admitting: Family Medicine

## 2015-03-19 ENCOUNTER — Ambulatory Visit: Payer: Medicare PPO

## 2015-04-05 ENCOUNTER — Ambulatory Visit (HOSPITAL_COMMUNITY)
Admission: RE | Admit: 2015-04-05 | Discharge: 2015-04-05 | Disposition: A | Discharge status: Home / Self Care | Payer: Medicare PPO | Source: Ambulatory Visit | Attending: Family Medicine | Admitting: Family Medicine

## 2015-04-05 DIAGNOSIS — R911 Solitary pulmonary nodule: Secondary | ICD-10-CM

## 2015-04-06 ENCOUNTER — Ambulatory Visit (HOSPITAL_COMMUNITY)
Admission: RE | Admit: 2015-04-06 | Discharge: 2015-04-06 | Disposition: A | Discharge status: Home / Self Care | Payer: Medicare PPO | Source: Ambulatory Visit | Attending: Family Medicine | Admitting: Family Medicine

## 2015-04-06 DIAGNOSIS — R918 Other nonspecific abnormal finding of lung field: Secondary | ICD-10-CM | POA: Insufficient documentation

## 2015-04-06 DIAGNOSIS — I2510 Atherosclerotic heart disease of native coronary artery without angina pectoris: Secondary | ICD-10-CM | POA: Insufficient documentation

## 2015-04-06 DIAGNOSIS — Z87891 Personal history of nicotine dependence: Secondary | ICD-10-CM | POA: Insufficient documentation

## 2015-04-06 DIAGNOSIS — D1779 Benign lipomatous neoplasm of other sites: Secondary | ICD-10-CM | POA: Insufficient documentation

## 2015-04-06 DIAGNOSIS — I10 Essential (primary) hypertension: Secondary | ICD-10-CM | POA: Diagnosis not present

## 2015-04-15 ENCOUNTER — Ambulatory Visit (INDEPENDENT_AMBULATORY_CARE_PROVIDER_SITE_OTHER): Payer: Medicare PPO | Admitting: Family Medicine

## 2015-04-15 DIAGNOSIS — E538 Deficiency of other specified B group vitamins: Secondary | ICD-10-CM

## 2015-04-15 MED ORDER — CYANOCOBALAMIN 1000 MCG/ML IJ SOLN
1000.0000 ug | Freq: Once | INTRAMUSCULAR | Status: AC
  Administered 2015-04-15: 1000 ug via INTRAMUSCULAR

## 2015-05-10 ENCOUNTER — Other Ambulatory Visit: Payer: Self-pay | Admitting: Family Medicine

## 2015-05-10 NOTE — Telephone Encounter (Signed)
RF request for metoprolol tartrate LOV: 03/18/15 Next ov: None Last written: 01/20/15 #60 w/ 3RF

## 2015-05-18 ENCOUNTER — Ambulatory Visit (INDEPENDENT_AMBULATORY_CARE_PROVIDER_SITE_OTHER): Payer: Medicare PPO | Admitting: Family Medicine

## 2015-05-18 DIAGNOSIS — E538 Deficiency of other specified B group vitamins: Secondary | ICD-10-CM

## 2015-05-18 MED ORDER — CYANOCOBALAMIN 1000 MCG/ML IJ SOLN
1000.0000 ug | Freq: Once | INTRAMUSCULAR | Status: AC
  Administered 2015-05-18: 1000 ug via INTRAMUSCULAR

## 2015-06-17 ENCOUNTER — Ambulatory Visit (INDEPENDENT_AMBULATORY_CARE_PROVIDER_SITE_OTHER): Payer: Medicare PPO | Admitting: *Deleted

## 2015-06-17 DIAGNOSIS — Z23 Encounter for immunization: Secondary | ICD-10-CM | POA: Diagnosis not present

## 2015-06-17 DIAGNOSIS — E538 Deficiency of other specified B group vitamins: Secondary | ICD-10-CM | POA: Diagnosis not present

## 2015-06-17 MED ORDER — CYANOCOBALAMIN 1000 MCG/ML IJ SOLN
1000.0000 ug | Freq: Once | INTRAMUSCULAR | Status: AC
  Administered 2015-06-17: 1000 ug via INTRAMUSCULAR

## 2015-07-19 ENCOUNTER — Ambulatory Visit (INDEPENDENT_AMBULATORY_CARE_PROVIDER_SITE_OTHER): Payer: Medicare PPO | Admitting: Family Medicine

## 2015-07-19 DIAGNOSIS — E538 Deficiency of other specified B group vitamins: Secondary | ICD-10-CM | POA: Diagnosis not present

## 2015-07-19 MED ORDER — CYANOCOBALAMIN 1000 MCG/ML IJ SOLN
1000.0000 ug | Freq: Once | INTRAMUSCULAR | Status: AC
  Administered 2015-07-19: 1000 ug via INTRAMUSCULAR

## 2015-08-17 DIAGNOSIS — H2513 Age-related nuclear cataract, bilateral: Secondary | ICD-10-CM | POA: Diagnosis not present

## 2015-08-18 ENCOUNTER — Ambulatory Visit (INDEPENDENT_AMBULATORY_CARE_PROVIDER_SITE_OTHER): Payer: Medicare PPO | Admitting: Family Medicine

## 2015-08-18 DIAGNOSIS — E538 Deficiency of other specified B group vitamins: Secondary | ICD-10-CM | POA: Diagnosis not present

## 2015-08-18 MED ORDER — CYANOCOBALAMIN 1000 MCG/ML IJ SOLN
1000.0000 ug | Freq: Once | INTRAMUSCULAR | Status: AC
  Administered 2015-08-18: 1000 ug via INTRAMUSCULAR

## 2015-09-02 ENCOUNTER — Other Ambulatory Visit: Payer: Self-pay | Admitting: *Deleted

## 2015-09-02 MED ORDER — LOSARTAN POTASSIUM 100 MG PO TABS: 100.0000 mg | ORAL_TABLET | Freq: Every day | ORAL | Status: DC

## 2015-09-02 NOTE — Telephone Encounter (Signed)
Pts insurance requested 90 day supply for his losartan. Pts wife advised and voiced understanding, she agreed. Rx sent.   RF request for losartan LOV: 03/18/15 Next ov: None Last written: 12/10/14 #30 w/ 11RF

## 2015-09-17 ENCOUNTER — Ambulatory Visit (INDEPENDENT_AMBULATORY_CARE_PROVIDER_SITE_OTHER): Payer: Medicare PPO | Admitting: Family Medicine

## 2015-09-17 DIAGNOSIS — E538 Deficiency of other specified B group vitamins: Secondary | ICD-10-CM

## 2015-09-17 MED ORDER — CYANOCOBALAMIN 1000 MCG/ML IJ SOLN
1000.0000 ug | Freq: Once | INTRAMUSCULAR | Status: AC
  Administered 2015-09-17: 1000 ug via INTRAMUSCULAR

## 2015-10-18 ENCOUNTER — Ambulatory Visit (INDEPENDENT_AMBULATORY_CARE_PROVIDER_SITE_OTHER): Payer: Medicare PPO | Admitting: Family Medicine

## 2015-10-18 DIAGNOSIS — E538 Deficiency of other specified B group vitamins: Secondary | ICD-10-CM

## 2015-10-18 MED ORDER — CYANOCOBALAMIN 1000 MCG/ML IJ SOLN: 1000.0000 ug | Freq: Once | INTRAMUSCULAR | Status: DC

## 2015-11-18 ENCOUNTER — Ambulatory Visit (INDEPENDENT_AMBULATORY_CARE_PROVIDER_SITE_OTHER): Payer: Medicare Other | Admitting: *Deleted

## 2015-11-18 ENCOUNTER — Ambulatory Visit: Payer: Medicare PPO

## 2015-11-18 DIAGNOSIS — E538 Deficiency of other specified B group vitamins: Secondary | ICD-10-CM

## 2015-11-18 MED ORDER — CYANOCOBALAMIN 1000 MCG/ML IJ SOLN
1000.0000 ug | Freq: Once | INTRAMUSCULAR | Status: AC
  Administered 2015-11-18: 1000 ug via INTRAMUSCULAR

## 2015-11-28 IMAGING — CT CT CHEST W/O CM
2 of 3 series · 15 of 36 positions shown, 18 images · non-contrast
Comparison: 07/15/2013

CLINICAL DATA: pulmonary nodules, history of smoking, hypertension

EXAM:
CT CHEST WITHOUT CONTRAST
TECHNIQUE: Multidetector CT imaging of the chest was performed following the
standard protocol without IV contrast.

[Series 2: chestroutine 5.0 b40f · axial · 0.66mm/px · z∈[-183,+102]mm · 12 of 69 slices shown, 15 images]
[im 6/69  mediastinal]
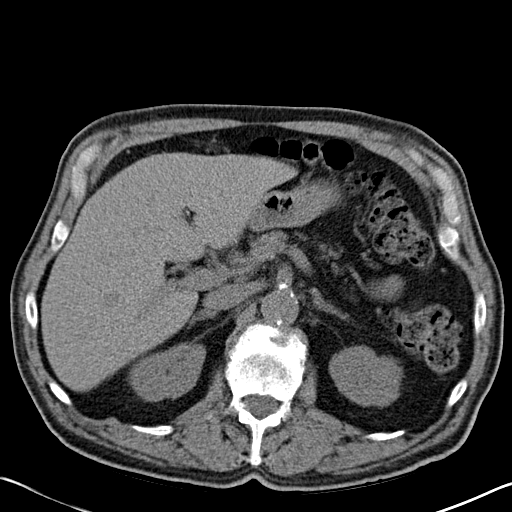
[im 6/69  lung]
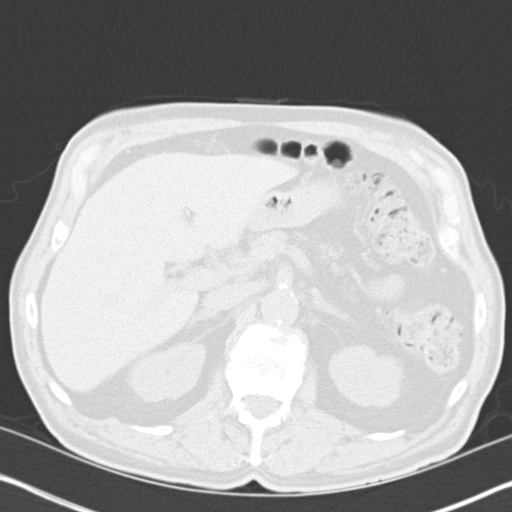
[im 11/69  lung]
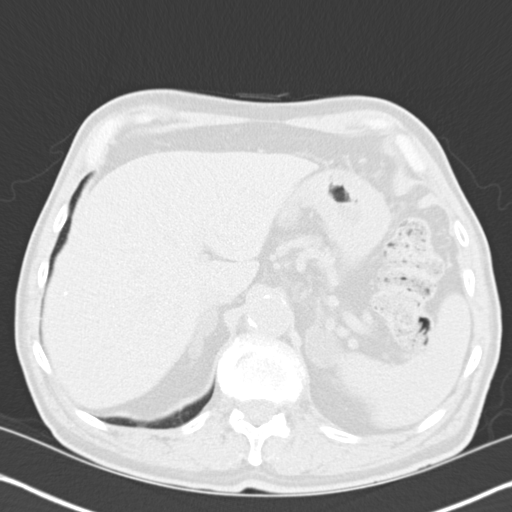
[im 16/69  lung]
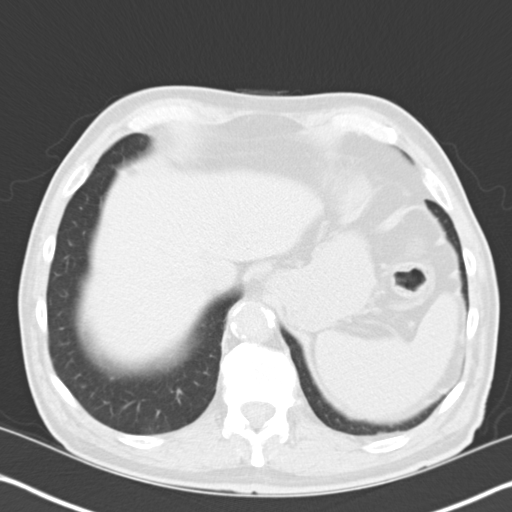
[im 21/69  lung]
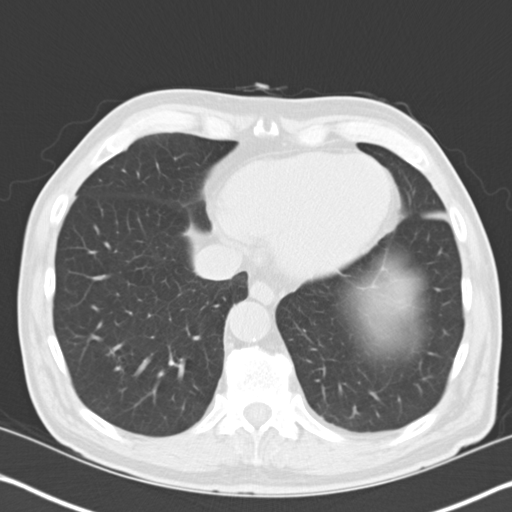
[im 26/69  mediastinal]
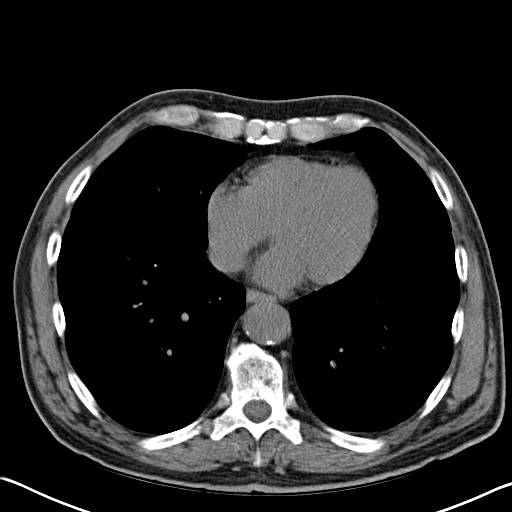
[im 26/69  lung]
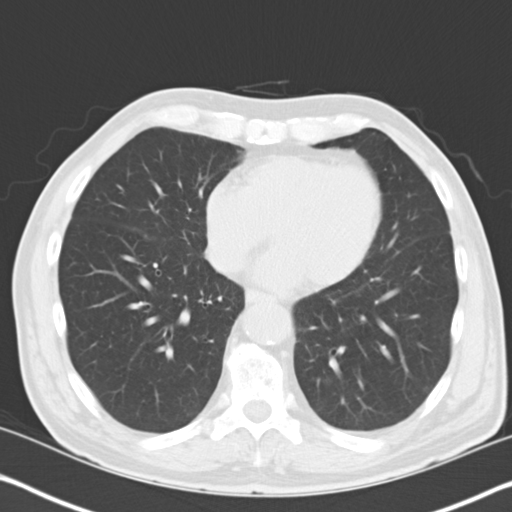
[im 31/69  lung]
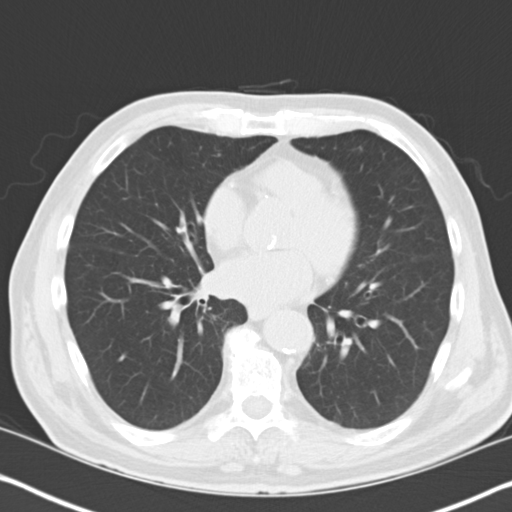
[im 38/69  lung]
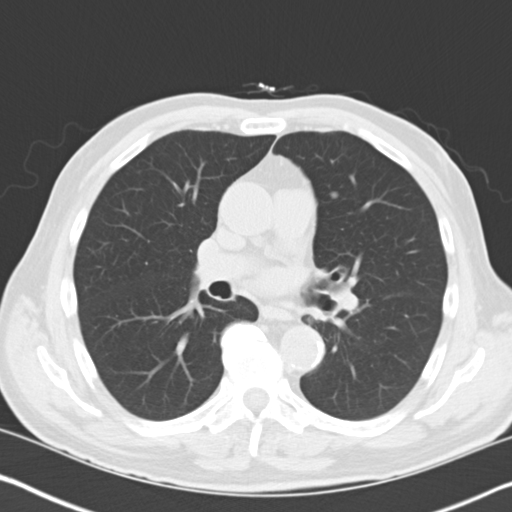
[im 43/69  lung]
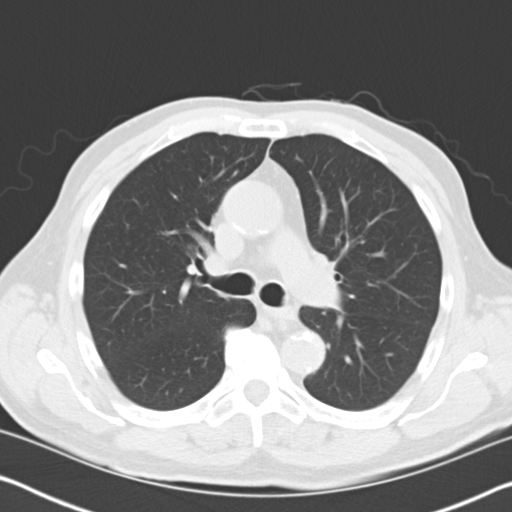
[im 48/69  mediastinal]
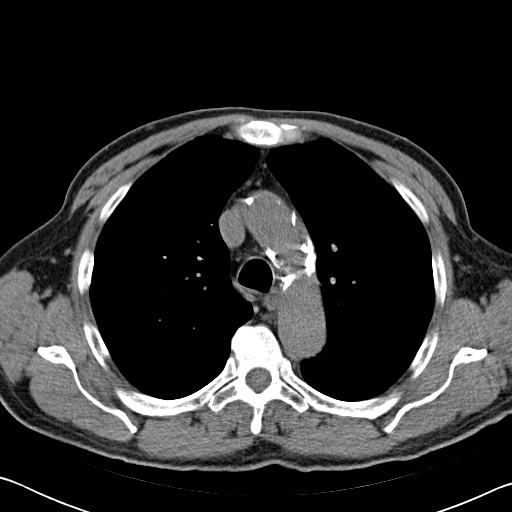
[im 48/69  lung]
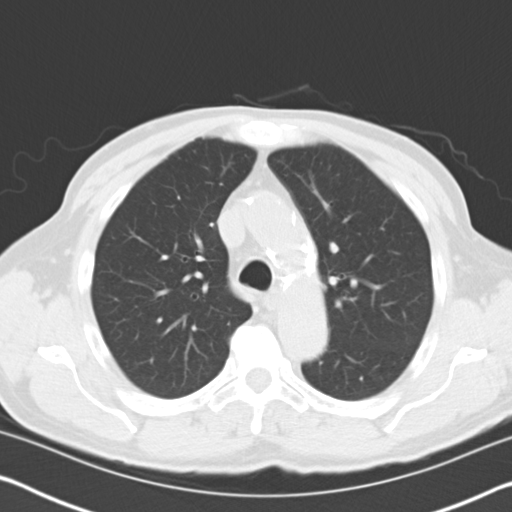
[im 53/69  lung]
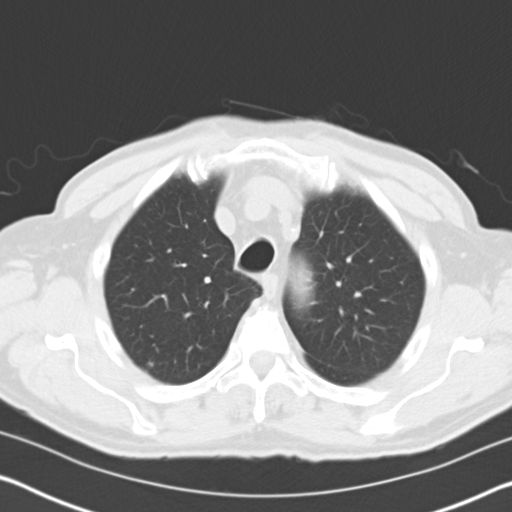
[im 58/69  lung]
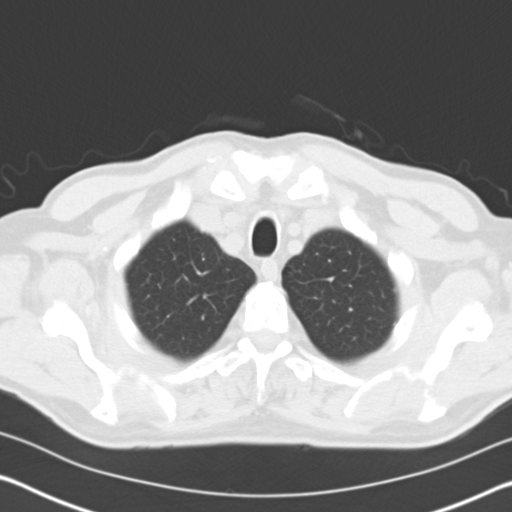
[im 63/69  lung]
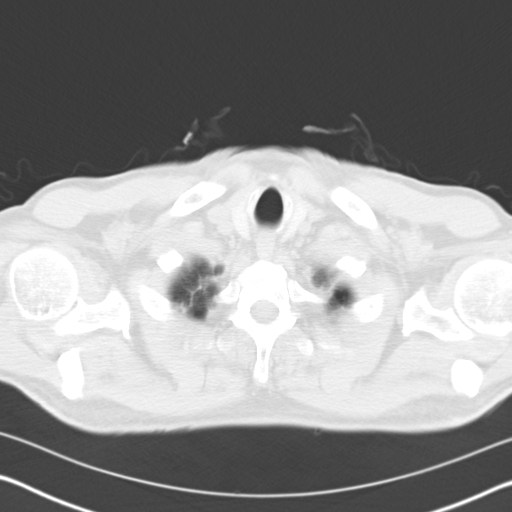

[Series 4: mpr coro 3mm · coronal · 0.67mm/px · 3 of 87 slices shown]
[im 18/87  lung]
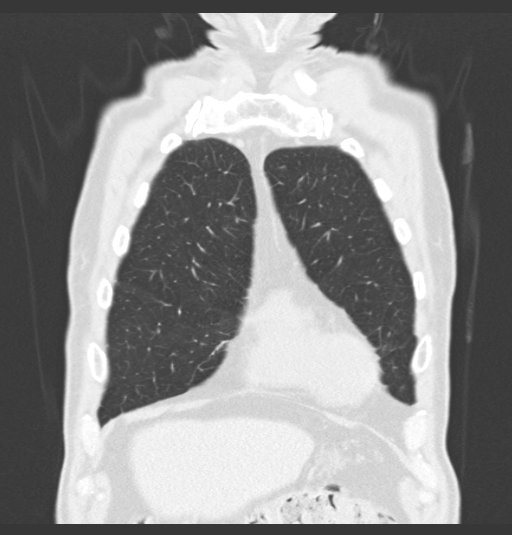
[im 35/87  lung]
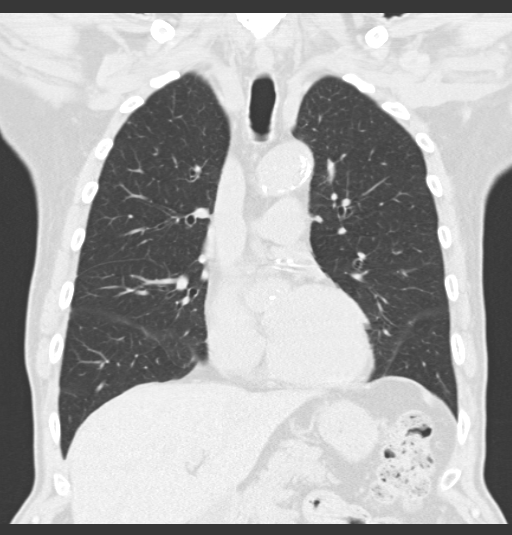
[im 52/87  lung]
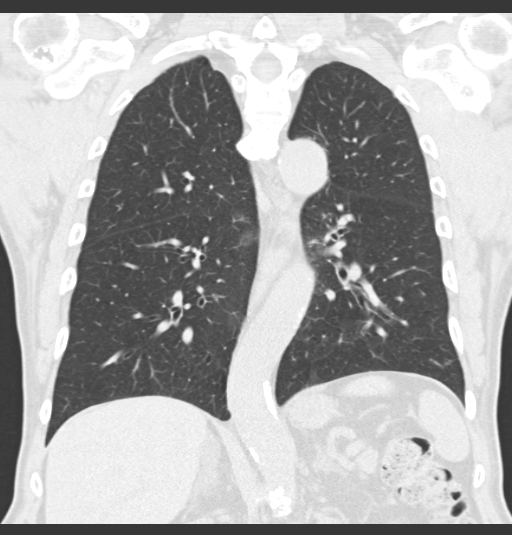

[15 of 36 positions shown; findings below may reference images not displayed]

FINDINGS: Soft tissue windows demonstrate atherosclerotic changes of the major
branch vessels and the thoracic aorta without significant aneurysm.
Coronary calcifications also noted. Normal heart size. No
pericardial or pleural effusion. No adenopathy. Negative for hiatal
hernia.

Included upper abdomen demonstrates abdominal atherosclerosis.
Stable lobular hypodense left adrenal mass containing fat compatible
with a myelolipoma.

Diffuse thoracic spine degenerative changes.

Lung windows demonstrate a stable 5 mm noncalcified nodule
anteriorly in the left upper lobe, image 31. There is also a stable
right upper lobe peripheral subpleural 7 mm nodule without
calcification. No new or enlarging nodules demonstrated in either
lung. Lungs remain clear. No focal airspace process, collapse or
consolidation. Negative for edema or interstitial process. No
pneumothorax. Trachea central airways are patent.

Degenerative changes of the thoracic spine. No acute compression
fracture. Intact sternum. No chest wall abnormality or soft tissue
asymmetry.
IMPRESSION: Stable subcentimeter upper lobe pulmonary nodules bilaterally.
Recommend 1 more additional follow-up exam after 11/ 7547 to confirm
stability for more than 2 years.

Aortic and coronary atherosclerosis

Stable left adrenal myelolipoma

No acute intra thoracic process

## 2015-12-20 ENCOUNTER — Ambulatory Visit (INDEPENDENT_AMBULATORY_CARE_PROVIDER_SITE_OTHER): Payer: Medicare Other | Admitting: *Deleted

## 2015-12-20 DIAGNOSIS — E538 Deficiency of other specified B group vitamins: Secondary | ICD-10-CM

## 2015-12-20 MED ORDER — CYANOCOBALAMIN 1000 MCG/ML IJ SOLN
1000.0000 ug | Freq: Once | INTRAMUSCULAR | Status: AC
  Administered 2015-12-20: 1000 ug via INTRAMUSCULAR

## 2015-12-20 NOTE — Progress Notes (Signed)
Patient presents today for B 12 injection per Dr Idelle Leech orders.

## 2016-01-04 ENCOUNTER — Ambulatory Visit (INDEPENDENT_AMBULATORY_CARE_PROVIDER_SITE_OTHER): Payer: Medicare Other | Admitting: Family Medicine

## 2016-01-04 ENCOUNTER — Encounter: Payer: Self-pay | Admitting: Family Medicine

## 2016-01-04 VITALS — BP 202/100 | HR 49 | Temp 98.3°F | Resp 16 | Ht 69.0 in | Wt 158.8 lb

## 2016-01-04 DIAGNOSIS — F17200 Nicotine dependence, unspecified, uncomplicated: Secondary | ICD-10-CM

## 2016-01-04 DIAGNOSIS — N182 Chronic kidney disease, stage 2 (mild): Secondary | ICD-10-CM

## 2016-01-04 DIAGNOSIS — I10 Essential (primary) hypertension: Secondary | ICD-10-CM | POA: Diagnosis not present

## 2016-01-04 DIAGNOSIS — Z23 Encounter for immunization: Secondary | ICD-10-CM

## 2016-01-04 DIAGNOSIS — R918 Other nonspecific abnormal finding of lung field: Secondary | ICD-10-CM

## 2016-01-04 DIAGNOSIS — G250 Essential tremor: Secondary | ICD-10-CM | POA: Diagnosis not present

## 2016-01-04 DIAGNOSIS — R0989 Other specified symptoms and signs involving the circulatory and respiratory systems: Secondary | ICD-10-CM

## 2016-01-04 DIAGNOSIS — E785 Hyperlipidemia, unspecified: Secondary | ICD-10-CM | POA: Diagnosis not present

## 2016-01-04 DIAGNOSIS — S76312A Strain of muscle, fascia and tendon of the posterior muscle group at thigh level, left thigh, initial encounter: Secondary | ICD-10-CM

## 2016-01-04 DIAGNOSIS — Z0000 Encounter for general adult medical examination without abnormal findings: Secondary | ICD-10-CM

## 2016-01-04 DIAGNOSIS — N2889 Other specified disorders of kidney and ureter: Secondary | ICD-10-CM

## 2016-01-04 LAB — COMPREHENSIVE METABOLIC PANEL
ALK PHOS: 100 U/L (ref 39–117)
ALT: 9 U/L (ref 0–53)
AST: 12 U/L (ref 0–37)
Albumin: 4.4 g/dL (ref 3.5–5.2)
BILIRUBIN TOTAL: 0.7 mg/dL (ref 0.2–1.2)
BUN: 9 mg/dL (ref 6–23)
CO2: 31 meq/L (ref 19–32)
CREATININE: 1.1 mg/dL (ref 0.40–1.50)
Calcium: 9.9 mg/dL (ref 8.4–10.5)
Chloride: 101 mEq/L (ref 96–112)
GFR: 69.79 mL/min (ref >60.00)
GLUCOSE: 93 mg/dL (ref 70–99)
Potassium: 4.6 mEq/L (ref 3.5–5.1)
Sodium: 138 mEq/L (ref 135–145)
TOTAL PROTEIN: 7.4 g/dL (ref 6.0–8.3)

## 2016-01-04 MED ORDER — METOPROLOL SUCCINATE ER 100 MG PO TB24: 100.0000 mg | ORAL_TABLET | Freq: Every day | ORAL | Status: DC

## 2016-01-04 NOTE — Progress Notes (Signed)
Pre visit review using our clinic review tool, if applicable. No additional management support is needed unless otherwise documented below in the visit note. 

## 2016-01-04 NOTE — Progress Notes (Signed)
OFFICE VISIT  01/04/2016   CC:  Chief Complaint  Patient presents with  . Follow-up    Pt is not fasting.      HPI:    Patient is a 75 y.o. Caucasian male who presents for f/u HTN, hyperlipidemia, CRI stage II/III. Has been up all night vaccuming water out of a flooded basement. He has no home bp measurements to report, says he is compliant with bp meds.  Denies HA, vision complaints, dizziness, CP, SOB, or palpitations. Takes his atorvastatin daily, denies side effects.  Reports 1 week of pain down hamstring and sometimes extends to proximal calf on R.  No preceding incident or trauma that he recalls but he works doing physical labor a lot.  No tingling or numbness.  No weakness.  Has good control of bowel/bladder.  No saddle anesthesia.  Recalls a similar thing in the past about 2 yrs ago, got better with PT--this was more of a sciatica picture.  Laying on back with leg flexed up helps relieve the pain some.    Still smoking about 1 pack per day Marlboro Reds.  Pt not contemplating quitting at this time.  Past Medical History  Diagnosis Date  . Carotid artery occlusion     carotid dopplers 02/06/12-patent left carotid endarterectomy; right internal carotid 40-59%  . Hypertension   . Chronic renal insufficiency, stage II (mild)     Borderline stage II/III--CrCl @ 60.  No RAS on 2003 angiogram (done for + renal artery dopplers--deemed eventually to have been a false positive).  Dr. Gwenlyn Found.  . Hyperlipidemia   . Adenomatous colon polyp     Dr. Laural Golden (28 polyps on 1st endo, 3 on 2nd endo a year later.  8 on colonoscopy 02/26/14--recall 3 yrs.  Marland Kitchen BPH (benign prostatic hyperplasia)     Dr. Lum Babe (pt is asymptomatic)  . Plantar wart of left foot 12/21/2012  . Solitary pulmonary nodule 03/14/2013    71m RUL 03/08/13--stable on repeat 07/2013; (next CT due after 07/2015  due to pt's high risk of cancer (chronic tob dependence--ongoing)   . DDD (degenerative disc disease), lumbar 02/2013   with spondylosis and moderate spinal stenosis+ left L5 and S1 nerve root impingement  . Fatty liver 2006    Noted on abd u/s and renal u/s 2006/2007  . Aortic insufficiency     echo 03/25/12-EF>55%, mild-mod Aortic regurg, sclerotic aortic valve,   . SOB (shortness of breath)     MET test 03/25/12-mildly abnormal  . Tobacco dependence   . Vitamin B12 deficiency without anemia 2016    Intrinsic factor NEG: home vit B12 injections started 11/17/14  . Peripheral neuropathic pain ([facility] 2016    and paresthesias (feet); suspected due to B12 deficiency  . Essential tremor     Past Surgical History  Procedure Laterality Date  . Carotid endarterectomy  11/25/08    Left     ICA  . Appendectomy  remote  . Arm surgery  July 2010    Left arm cyst/Lipoma  . Umbilical hernia repair    . Tonsillectomy  remote  . Colonoscopy N/A 02/26/2014    Recall 3 yrs: (+ polypectomy--tubular adenoma) Procedure: COLONOSCOPY;  Surgeon: NRogene Houston MD;  Location: [facility];  Service: Endoscopy;  Laterality: N/A;  830   Meds: Aspirin [id number] Outpatient Prescriptions Prior to Visit  Medication Sig Dispense Refill  . atorvastatin (LIPITOR) 80 MG tablet Take 1 tablet (80 mg total) by mouth daily. 90 tablet 3  .  gabapentin (NEURONTIN) 300 MG capsule Take 1 capsule (300 mg total) by mouth 3 (three) times daily. 90 capsule 3  . losartan (COZAAR) 100 MG tablet Take 1 tablet (100 mg total) by mouth daily. 90 tablet 3  . omeprazole (PRILOSEC) 20 MG capsule Take 20 mg by mouth daily as needed (heartburn).     . primidone (MYSOLINE) 50 MG tablet TAKE 1 TABLET BY MOUTH TWICE DAILY 180 tablet 1  . metoprolol tartrate (LOPRESSOR) 25 MG tablet take 1 tablet by mouth twice a day 60 tablet 3  . metoprolol tartrate (LOPRESSOR) 25 MG tablet TAKE 1 TABLET BY MOUTH TWICE DAILY (Patient not taking: Reported on 01/04/2016) 180 tablet 1   No facility-administered medications prior to visit.    Allergies  Allergen Reactions  .  Clonidine Derivatives Rash    ROS As per HPI  PE: Blood pressure 202/100, pulse 49, temperature 98.3 F (36.8 C), temperature source Oral, resp. rate 16, height 5' 9"  (1.753 m), weight 158 lb 12 oz (72.009 kg), SpO2 96 %.Repeat bp was similar to the first one. Gen: Alert, well appearing.  Patient is oriented to person, place, time, and situation. AFFECT: pleasant, lucid thought and speech. CV: RRR, occasional ectopic beat, 2/6 systolic murmur heard best at cardiac base. LUNGS: CTA bilat, aeration is diminished on exhalation but exp phase not significantly prolonged.  Breathing nonlabored. EXT: no clubbing, cyanosis, or edema.  Left glut and LB nontender.  Full ROM of L leg w/out pain.  Sitting SLR neg bilat.  No TTP in sciatic notch.  No tenderness of muscles of the L leg. LE strength 5/5 prox/dist bilat.   LABS:  Lab Results  Component Value Date   TSH 2.28 11/12/2014   Lab Results  Component Value Date   WBC 8.5 11/12/2014   HGB 14.9 11/12/2014   HCT 43.1 11/12/2014   MCV 85.8 11/12/2014   PLT 225.0 11/12/2014   Lab Results  Component Value Date   CREATININE 1.27 11/12/2014   BUN 13 11/12/2014   NA 141 11/12/2014   K 4.3 11/12/2014   CL 104 11/12/2014   CO2 33* 11/12/2014   Lab Results  Component Value Date   ALT 7 11/12/2014   AST 11 11/12/2014   ALKPHOS 89 11/12/2014   BILITOT 0.4 11/12/2014   Lab Results  Component Value Date   CHOL 124 06/26/2013   Lab Results  Component Value Date   HDL 33.80* 06/26/2013   Lab Results  Component Value Date   LDLCALC 71 06/26/2013   Lab Results  Component Value Date   TRIG 98.0 06/26/2013   Lab Results  Component Value Date   CHOLHDL 4 06/26/2013   Lab Results  Component Value Date   PSA 0.85 12/23/2012    IMPRESSION AND PLAN:  1) Labile/uncontrolled HTN: changed his lopressor 33m bid to Toprol XL at the 100 mg dosing. Continue losartan 100 mg qd.  Monitor bp daily at home and bring numbers to office  for recheck in 2 weeks. Check lytes/cr.  2) Hyperlipidemia: tolerating statin.  Not fasting today so a FLP is not an option.  Check AST/ALT.  3) Pulmonary nodules: as per radiology guidelines, I have ordered one more CT chest w/out contrast to follow these for stability.  If stable on this exam then no further CT's needed.  4) Tobacco dependence: encouraged cessation again but he is not contemplating this at this time.  5) L hamstring strain: rest, stretching discussed.  6) Preventative health  care:  prevnar 13 IM today.  Gave rx for Tdap for him to take to pharmacy to get so this can be covered under medicare part D.  7) CRI stage II/III: BMET today.  An After Visit Summary was printed and given to the patient.  FOLLOW UP: Return in about 2 weeks (around 01/18/2016) for f/u HTN.  Signed:  Crissie Sickles, MD           01/04/2016

## 2016-01-13 ENCOUNTER — Ambulatory Visit (HOSPITAL_COMMUNITY)
Admission: RE | Admit: 2016-01-13 | Discharge: 2016-01-13 | Disposition: A | Discharge status: Home / Self Care | Payer: Medicare Other | Source: Ambulatory Visit | Attending: Family Medicine | Admitting: Family Medicine

## 2016-01-13 ENCOUNTER — Encounter (HOSPITAL_COMMUNITY): Payer: Self-pay

## 2016-01-13 DIAGNOSIS — R918 Other nonspecific abnormal finding of lung field: Secondary | ICD-10-CM | POA: Insufficient documentation

## 2016-01-14 ENCOUNTER — Telehealth: Payer: Self-pay | Admitting: *Deleted

## 2016-01-14 NOTE — Telephone Encounter (Signed)
Spoke with patient wife reviewed CT results.

## 2016-01-19 ENCOUNTER — Ambulatory Visit (INDEPENDENT_AMBULATORY_CARE_PROVIDER_SITE_OTHER): Payer: Medicare Other | Admitting: Family Medicine

## 2016-01-19 ENCOUNTER — Encounter: Payer: Self-pay | Admitting: Family Medicine

## 2016-01-19 VITALS — BP 146/72 | HR 47 | Temp 98.2°F | Ht 69.0 in | Wt 157.1 lb

## 2016-01-19 DIAGNOSIS — I10 Essential (primary) hypertension: Secondary | ICD-10-CM | POA: Diagnosis not present

## 2016-01-19 DIAGNOSIS — R0989 Other specified symptoms and signs involving the circulatory and respiratory systems: Secondary | ICD-10-CM

## 2016-01-19 DIAGNOSIS — E538 Deficiency of other specified B group vitamins: Secondary | ICD-10-CM | POA: Diagnosis not present

## 2016-01-19 MED ORDER — CYANOCOBALAMIN 1000 MCG/ML IJ SOLN
1000.0000 ug | Freq: Once | INTRAMUSCULAR | Status: AC
  Administered 2016-01-19: 1000 ug via INTRAMUSCULAR

## 2016-01-19 NOTE — Addendum Note (Signed)
Addended by: Milta Deiters on: 01/19/2016 10:51 AM   Modules accepted: Orders

## 2016-01-19 NOTE — Progress Notes (Signed)
Pre visit review using our clinic review tool, if applicable. No additional management support is needed unless otherwise documented below in the visit note. 

## 2016-01-19 NOTE — Progress Notes (Signed)
OFFICE VISIT  01/19/2016   CC:  Chief Complaint  Patient presents with  . Follow-up    blood pressure   He is not fasting today.  HPI:    Patient is a 75 y.o. Caucasian male who presents for 2 wk f/u uncontrolled HTN. Changed his lopressor to toprol xl and increased dose: 100 mg qd.  Feels no side effects from the new med. No home bp checks since last visit.  No orthostatic dizziness.  Past Medical History  Diagnosis Date  . Carotid artery occlusion     carotid dopplers 02/06/12-patent left carotid endarterectomy; right internal carotid 40-59%  . Hypertension   . Chronic renal insufficiency, stage II (mild)     Borderline stage II/III--CrCl @ 62.  No RAS on 2003 angiogram (done for + renal artery dopplers--deemed eventually to have been a false positive).  Dr. Gwenlyn Found.  . Hyperlipidemia   . Adenomatous colon polyp     Dr. Laural Golden (28 polyps on 1st endo, 3 on 2nd endo a year later.  8 on colonoscopy 02/26/14--recall 3 yrs.  Marland Kitchen BPH (benign prostatic hyperplasia)     Dr. Lum Babe (pt is asymptomatic)  . Plantar wart of left foot 12/21/2012  . Solitary pulmonary nodule 03/14/2013    39m RUL 03/08/13--stable on repeat 07/2013, 03/2015, and 01/2016.  No additional imaging is required.  . DDD (degenerative disc disease), lumbar 02/2013    with spondylosis and moderate spinal stenosis+ left L5 and S1 nerve root impingement  . Fatty liver 2006    Noted on abd u/s and renal u/s 2006/2007  . Aortic insufficiency     echo 03/25/12-EF>55%, mild-mod Aortic regurg, sclerotic aortic valve,   . SOB (shortness of breath)     MET test 03/25/12-mildly abnormal  . Tobacco dependence   . Vitamin B12 deficiency without anemia 2016    Intrinsic factor NEG: home vit B12 injections started 11/17/14  . Peripheral neuropathic pain ([facility] 2016    and paresthesias (feet); suspected due to B12 deficiency  . Essential tremor     Past Surgical History  Procedure Laterality Date  . Carotid endarterectomy  11/25/08   Left     ICA  . Appendectomy  remote  . Arm surgery  July 2010    Left arm cyst/Lipoma  . Umbilical hernia repair    . Tonsillectomy  remote  . Colonoscopy N/A 02/26/2014    Recall 3 yrs: (+ polypectomy--tubular adenoma) Procedure: COLONOSCOPY;  Surgeon: NRogene Houston MD;  Location: [facility];  Service: Endoscopy;  Laterality: N/A;  830    Outpatient Prescriptions Prior to Visit  Medication Sig Dispense Refill  . aspirin 81 MG tablet Take 81 mg by mouth daily.    .Marland Kitchenatorvastatin (LIPITOR) 80 MG tablet Take 1 tablet (80 mg total) by mouth daily. 90 tablet 3  . gabapentin (NEURONTIN) 300 MG capsule Take 1 capsule (300 mg total) by mouth 3 (three) times daily. 90 capsule 3  . losartan (COZAAR) 100 MG tablet Take 1 tablet (100 mg total) by mouth daily. 90 tablet 3  . metoprolol succinate (TOPROL-XL) 100 MG 24 hr tablet Take 1 tablet (100 mg total) by mouth daily. Take with or immediately following a meal. 30 tablet 1  . omeprazole (PRILOSEC) 20 MG capsule Take 20 mg by mouth daily as needed (heartburn).     . primidone (MYSOLINE) 50 MG tablet TAKE 1 TABLET BY MOUTH TWICE DAILY 180 tablet 1   No facility-administered medications prior to visit.  Allergies  Allergen Reactions  . Clonidine Derivatives Rash    ROS As per HPI  PE: Blood pressure 146/72, pulse 47, temperature 98.2 F (36.8 C), temperature source Oral, height _0  (1.753 m), weight 157 lb 1.9 oz (71.269 kg), SpO2 100 %. Gen: Alert, well appearing.  Patient is oriented to person, place, time, and situation. CV: Regular, rate 50 or so, 2/6 systolic murmur heard best at base EXT: no clubbing, cyanosis, or edema.    LABS:    Chemistry      Component Value Date/Time   NA 138 01/04/2016 1158   K 4.6 01/04/2016 1158   CL 101 01/04/2016 1158   CO2 31 01/04/2016 1158   BUN 9 01/04/2016 1158   CREATININE 1.10 01/04/2016 1158   CREATININE 1.19 07/04/2013 1115      Component Value Date/Time   CALCIUM 9.9  01/04/2016 1158   ALKPHOS 100 01/04/2016 1158   AST 12 01/04/2016 1158   ALT 9 01/04/2016 1158   BILITOT 0.7 01/04/2016 1158     Lab Results  Component Value Date   CHOL 124 06/26/2013   HDL 33.80* 06/26/2013   LDLCALC 71 06/26/2013   TRIG 98.0 06/26/2013   CHOLHDL 4 06/26/2013   IMPRESSION AND PLAN:  HTN, control is nearly achieved.   He feels no adverse effects from his meds. Tolerating HR of about 50 at rest. Lytes/cr stable 12/2015.  An After Visit Summary was printed and given to the patient.  FOLLOW UP: Return in about 4 months (around 05/21/2016) for annual CPE (fasting).  Signed:  Crissie Sickles, MD           01/19/2016

## 2016-01-31 ENCOUNTER — Encounter: Payer: Self-pay | Admitting: Family Medicine

## 2016-01-31 ENCOUNTER — Ambulatory Visit (INDEPENDENT_AMBULATORY_CARE_PROVIDER_SITE_OTHER): Payer: Medicare Other | Admitting: Family Medicine

## 2016-01-31 VITALS — BP 128/77 | HR 55 | Temp 98.1°F | Resp 16 | Ht 69.0 in | Wt 155.8 lb

## 2016-01-31 DIAGNOSIS — M5432 Sciatica, left side: Secondary | ICD-10-CM | POA: Diagnosis not present

## 2016-01-31 MED ORDER — GABAPENTIN 300 MG PO CAPS: ORAL_CAPSULE | ORAL | Status: DC

## 2016-01-31 MED ORDER — PREDNISONE 20 MG PO TABS: ORAL_TABLET | ORAL | Status: DC

## 2016-01-31 NOTE — Progress Notes (Signed)
Pre visit review using our clinic review tool, if applicable. No additional management support is needed unless otherwise documented below in the visit note. 

## 2016-01-31 NOTE — Progress Notes (Signed)
OFFICE VISIT  01/31/2016   CC:  Chief Complaint  Patient presents with  . Leg Pain    left x 1 month   HPI:    Patient is a 75 y.o. Caucasian male who presents for ongoing left leg pain. Going on > 1 mo, no preceding incident recalled. Worse with getting up and down and any movement.  OK when laying down very still. Hurts mostly on back of left leg, esp calf but radiates from hip/buttocks area as well sometimes.  No weakness. No numbness but it does tingle and burn.  After he gets up and walks around a while it seems to get better.  Using a home TENS unit and it helps a little.  Takes nothing for the pain.   Past Medical History  Diagnosis Date  . Carotid artery occlusion     carotid dopplers 02/06/12-patent left carotid endarterectomy; right internal carotid 40-59%  . Hypertension   . Chronic renal insufficiency, stage II (mild)     Borderline stage II/III--CrCl @ 62.  No RAS on 2003 angiogram (done for + renal artery dopplers--deemed eventually to have been a false positive).  Dr. Gwenlyn Found.  . Hyperlipidemia   . Adenomatous colon polyp     Dr. Laural Golden (28 polyps on 1st endo, 3 on 2nd endo a year later.  8 on colonoscopy 02/26/14--recall 3 yrs.  Marland Kitchen BPH (benign prostatic hyperplasia)     Dr. Lum Babe (pt is asymptomatic)  . Plantar wart of left foot 12/21/2012  . Solitary pulmonary nodule 03/14/2013    41m RUL 03/08/13--stable on repeat 07/2013, 03/2015, and 01/2016.  No additional imaging is required.  . DDD (degenerative disc disease), lumbar 02/2013    with spondylosis and moderate spinal stenosis+ left L5 and S1 nerve root impingement  . Fatty liver 2006    Noted on abd u/s and renal u/s 2006/2007  . Aortic insufficiency     echo 03/25/12-EF>55%, mild-mod Aortic regurg, sclerotic aortic valve,   . SOB (shortness of breath)     MET test 03/25/12-mildly abnormal  . Tobacco dependence   . Vitamin B12 deficiency without anemia 2016    Intrinsic factor NEG: home vit B12 injections started  11/17/14  . Peripheral neuropathic pain ([place] 2016    and paresthesias (feet); suspected due to B12 deficiency  . Essential tremor     Past Surgical History  Procedure Laterality Date  . Carotid endarterectomy  11/25/08    Left     ICA  . Appendectomy  remote  . Arm surgery  July 2010    Left arm cyst/Lipoma  . Umbilical hernia repair    . Tonsillectomy  remote  . Colonoscopy N/A 02/26/2014    Recall 3 yrs: (+ polypectomy--tubular adenoma) Procedure: COLONOSCOPY;  Surgeon: NRogene Houston MD;  [facility];  Service: Endoscopy;  Laterality: N/A;  830    Outpatient Prescriptions Prior to Visit  Medication Sig Dispense Refill  . aspirin 81 MG tablet Take 81 mg by mouth daily.    .Marland Kitchenatorvastatin (LIPITOR) 80 MG tablet Take 1 tablet (80 mg total) by mouth daily. 90 tablet 3  . losartan (COZAAR) 100 MG tablet Take 1 tablet (100 mg total) by mouth daily. 90 tablet 3  . metoprolol succinate (TOPROL-XL) 100 MG 24 hr tablet Take 1 tablet (100 mg total) by mouth daily. Take with or immediately following a meal. 30 tablet 1  . omeprazole (PRILOSEC) 20 MG capsule Take 20 mg by mouth daily as needed (  heartburn).     . primidone (MYSOLINE) 50 MG tablet TAKE 1 TABLET BY MOUTH TWICE DAILY 180 tablet 1  . gabapentin (NEURONTIN) 300 MG capsule Take 1 capsule (300 mg total) by mouth 3 (three) times daily. 90 capsule 3   No facility-administered medications prior to visit.    Allergies  Allergen Reactions  . Clonidine Derivatives Rash    ROS As per HPI  PE: Blood pressure 128/77, pulse 55, temperature 98.1 F (36.7 C), temperature source Oral, resp. rate 16, height 5' 9"  (1.753 m), weight 155 lb 12 oz (70.648 kg), SpO2 100 %. Gen: Alert, well appearing.  Patient is oriented to person, place, time, and situation. Back: Full ROM intact, nontender. No gluteal or ischial tuberosity tenderness. Sitting SLR + on left at 160 deg. Leg strength 5/5 prox/dist bilat. DTRs: patellar 2+ R, 1+  L Achilles trace on R, absent on L.   LABS:  none  IMPRESSION AND PLAN:  Left leg sciatica: Plan: prednisone 61m qd x 5d, then 262mqd x 5d. Currently he takes his neurontin 300 mg bid most days instead of tid. I'll have him take it tid, with his hs dose being 600 mg now. If he does not improve with this, I'm considering referral to Dr. [facility]Nel[facility].  An After Visit Summary was printed and given to the patient.  FOLLOW UP: Return if symptoms worsen or fail to improve.  Signed:  PhCrissie SicklesMD           01/31/2016  .

## 2016-02-14 ENCOUNTER — Telehealth: Payer: Self-pay | Admitting: Family Medicine

## 2016-02-14 NOTE — Telephone Encounter (Signed)
Advised pt's daughter that Dr Anitra Lauth was not in this week and he would need to be re-evaluated by Dr Raoul Pitch for further medication.

## 2016-02-14 NOTE — Telephone Encounter (Signed)
Patient has finished prednisone & his back & leg is hurting again. Please advise

## 2016-02-22 ENCOUNTER — Encounter: Payer: Self-pay | Admitting: Family Medicine

## 2016-02-22 ENCOUNTER — Ambulatory Visit (INDEPENDENT_AMBULATORY_CARE_PROVIDER_SITE_OTHER): Payer: Medicare Other | Admitting: Family Medicine

## 2016-02-22 VITALS — BP 114/69 | HR 43 | Temp 97.8°F | Resp 16 | Ht 69.0 in | Wt 151.5 lb

## 2016-02-22 DIAGNOSIS — F17200 Nicotine dependence, unspecified, uncomplicated: Secondary | ICD-10-CM

## 2016-02-22 DIAGNOSIS — M5432 Sciatica, left side: Secondary | ICD-10-CM

## 2016-02-22 DIAGNOSIS — H66012 Acute suppurative otitis media with spontaneous rupture of ear drum, left ear: Secondary | ICD-10-CM | POA: Diagnosis not present

## 2016-02-22 DIAGNOSIS — E785 Hyperlipidemia, unspecified: Secondary | ICD-10-CM | POA: Diagnosis not present

## 2016-02-22 DIAGNOSIS — I10 Essential (primary) hypertension: Secondary | ICD-10-CM | POA: Diagnosis not present

## 2016-02-22 LAB — LIPID PANEL
CHOLESTEROL: 142 mg/dL (ref 0–200)
HDL: 37.9 mg/dL — AB (ref >39.00)
LDL CALC: 87 mg/dL (ref 0–99)
NONHDL: 103.97
Total CHOL/HDL Ratio: 4
Triglycerides: 84 mg/dL (ref 0.0–149.0)
VLDL: 16.8 mg/dL (ref 0.0–40.0)

## 2016-02-22 MED ORDER — METOPROLOL SUCCINATE ER 100 MG PO TB24: 100.0000 mg | ORAL_TABLET | Freq: Every day | ORAL | Status: DC

## 2016-02-22 MED ORDER — AMOXICILLIN 875 MG PO TABS: 875.0000 mg | ORAL_TABLET | Freq: Two times a day (BID) | ORAL | Status: AC

## 2016-02-22 MED ORDER — VARENICLINE TARTRATE 0.5 MG X 11 & 1 MG X 42 PO MISC: ORAL | Status: DC

## 2016-02-22 MED ORDER — OFLOXACIN 0.3 % OT SOLN: 10.0000 [drp] | Freq: Every day | OTIC | Status: DC

## 2016-02-22 NOTE — Progress Notes (Signed)
Pre visit review using our clinic review tool, if applicable. No additional management support is needed unless otherwise documented below in the visit note. 

## 2016-02-22 NOTE — Progress Notes (Signed)
OFFICE VISIT  02/22/2016   CC:  Chief Complaint  Patient presents with  . Follow-up    leg problem   HPI:    Patient is a 75 y.o. Caucasian male who presents for f/u left leg discomfort/sciatica.  Says it is feeling a whole lot better.  Just still some burning in back of leg from popliteal region up back of thigh half way up.  He finished his systemic steroid taper.  He is happy with his improvement/progress regarding this problem.  BP: no home monitoring.  Denies HA, dizziness, vision c/o, SOB, or CP. Taking statin w/out side effect.  Last 3 wks can't hear well out of L ear.  He already has chronic hearing dysfunction bilaterally.   Still smoking.  Has tried nicotine patches.  He is interested in trying chantix to help him quit.   Has not tried wellbutrin in the past.   Past Medical History  Diagnosis Date  . Carotid artery occlusion     carotid dopplers 02/06/12-patent left carotid endarterectomy; right internal carotid 40-59%  . Hypertension   . Chronic renal insufficiency, stage II (mild)     Borderline stage II/III--CrCl @ 62.  No RAS on 2003 angiogram (done for + renal artery dopplers--deemed eventually to have been a false positive).  Dr. Gwenlyn Found.  . Hyperlipidemia   . Adenomatous colon polyp     Dr. Laural Golden (28 polyps on 1st endo, 3 on 2nd endo a year later.  8 on colonoscopy 02/26/14--recall 3 yrs.  Marland Kitchen BPH (benign prostatic hyperplasia)     Dr. Lum Babe (pt is asymptomatic)  . Plantar wart of left foot 12/21/2012  . Solitary pulmonary nodule 03/14/2013    76m RUL 03/08/13--stable on repeat 07/2013, 03/2015, and 01/2016.  No additional imaging is required.  . DDD (degenerative disc disease), lumbar 02/2013    with spondylosis and moderate spinal stenosis+ left L5 and S1 nerve root impingement  . Fatty liver 2006    Noted on abd u/s and renal u/s 2006/2007  . Aortic insufficiency     echo 03/25/12-EF>55%, mild-mod Aortic regurg, sclerotic aortic valve,   . SOB (shortness of breath)      MET test 03/25/12-mildly abnormal  . Tobacco dependence     ongoing as of 02/2016  . Vitamin B12 deficiency without anemia 2016    Intrinsic factor NEG: home vit B12 injections started 11/17/14  . Peripheral neuropathic pain ([place] 2016    and paresthesias (feet); suspected due to B12 deficiency  . Essential tremor     Past Surgical History  Procedure Laterality Date  . Carotid endarterectomy  11/25/08    Left     ICA  . Appendectomy  remote  . Arm surgery  July 2010    Left arm cyst/Lipoma  . Umbilical hernia repair    . Tonsillectomy  remote  . Colonoscopy N/A 02/26/2014    Recall 3 yrs: (+ polypectomy--tubular adenoma) Procedure: COLONOSCOPY;  Surgeon: NRogene Houston MD;  [facility]: [facility];  Service: Endoscopy;  Laterality: N/A;  830    Outpatient Prescriptions Prior to Visit  Medication Sig Dispense Refill  . aspirin 81 MG tablet Take 81 mg by mouth daily.    .Marland Kitchenatorvastatin (LIPITOR) 80 MG tablet Take 1 tablet (80 mg total) by mouth daily. 90 tablet 3  . gabapentin (NEURONTIN) 300 MG capsule 1 cap po qAM, 1 cap po q afternoon, and 2 caps po qhs 120 capsule 6  . losartan (COZAAR) 100 MG tablet Take  1 tablet (100 mg total) by mouth daily. 90 tablet 3  . omeprazole (PRILOSEC) 20 MG capsule Take 20 mg by mouth daily as needed (heartburn).     . primidone (MYSOLINE) 50 MG tablet TAKE 1 TABLET BY MOUTH TWICE DAILY 180 tablet 1  . metoprolol succinate (TOPROL-XL) 100 MG 24 hr tablet Take 1 tablet (100 mg total) by mouth daily. Take with or immediately following a meal. 30 tablet 1  . predniSONE (DELTASONE) 20 MG tablet 2 tabs po qd x 5d, then 1 tab po qd x 5d (Patient not taking: Reported on 02/22/2016) 15 tablet 0   No facility-administered medications prior to visit.    Allergies  Allergen Reactions  . Clonidine Derivatives Rash    ROS As per HPI  PE: Blood pressure 114/69, pulse 43, temperature 97.8 F (36.6 C), temperature source Oral, resp. rate 16, height 5' 9"   (1.753 m), weight 151 lb 8 oz (68.72 kg), SpO2 100 %. Gen: Alert, well appearing.  Patient is oriented to person, place, time, and situation. ZOX:WRUE: no injection, icteris, swelling, or exudate.  EOMI, PERRLA. R ear EAC clear, TM normal.  L ear EAC mildly edematous and dull, TM distorted and dull with apparent oval perf, visible yellow pus.  No erythema. Mouth: lips without lesion/swelling.  Oral mucosa pink and moist. Oropharynx without erythema, exudate, or swelling.  Legs: sitting SLR neg bilat. DTRs: patellar 1+ bilat, achilles trace bilat   Leg nontender.   LABS:  Lab Results  Component Value Date   TSH 2.28 11/12/2014   Lab Results  Component Value Date   WBC 8.5 11/12/2014   HGB 14.9 11/12/2014   HCT 43.1 11/12/2014   MCV 85.8 11/12/2014   PLT 225.0 11/12/2014   Lab Results  Component Value Date   CREATININE 1.10 01/04/2016   BUN 9 01/04/2016   NA 138 01/04/2016   K 4.6 01/04/2016   CL 101 01/04/2016   CO2 31 01/04/2016   Lab Results  Component Value Date   ALT 9 01/04/2016   AST 12 01/04/2016   ALKPHOS 100 01/04/2016   BILITOT 0.7 01/04/2016   Lab Results  Component Value Date   CHOL 124 06/26/2013   Lab Results  Component Value Date   HDL 33.80* 06/26/2013   Lab Results  Component Value Date   LDLCALC 71 06/26/2013   Lab Results  Component Value Date   TRIG 98.0 06/26/2013   Lab Results  Component Value Date   CHOLHDL 4 06/26/2013   Lab Results  Component Value Date   PSA 0.85 12/23/2012   Lab Results  Component Value Date   HGBA1C 5.8* 11/12/2014   IMPRESSION AND PLAN:  1) Left leg sciatica: resolving gradually.  No new treatments recommended today.  2) Hyperlipidemia: due for recheck FLP today--drawn. Tolerating statin.  AST/ALT normal 12/2015.  3) Left AOM with perforated TM: amoxil 862m bid x 10d + floxin otic 10 drops in left ear qd x 10d.  4) Tobacco dependence: trial of chantix; starter pack rx given to patient today.   Therapeutic expectations and side effect profile of medication discussed today.  Patient's questions answered.  5) HTN: The current medical regimen is effective;  continue present plan and medications.  An After Visit Summary was printed and given to the patient.  FOLLOW UP: Return in about 1 month (around 03/23/2016) for f/u ear and smoking.  Signed:  PCrissie Sickles MD           02/22/2016

## 2016-03-01 ENCOUNTER — Encounter: Payer: Self-pay | Admitting: Family

## 2016-03-06 ENCOUNTER — Ambulatory Visit: Payer: Medicare PPO | Admitting: Family

## 2016-03-06 ENCOUNTER — Encounter (HOSPITAL_COMMUNITY): Payer: Medicare PPO

## 2016-03-07 ENCOUNTER — Ambulatory Visit (HOSPITAL_COMMUNITY)
Admission: RE | Admit: 2016-03-07 | Discharge: 2016-03-07 | Disposition: A | Discharge status: Home / Self Care | Payer: Medicare Other | Source: Ambulatory Visit | Attending: Family | Admitting: Family

## 2016-03-07 ENCOUNTER — Ambulatory Visit (INDEPENDENT_AMBULATORY_CARE_PROVIDER_SITE_OTHER): Payer: Medicare Other | Admitting: Family

## 2016-03-07 ENCOUNTER — Telehealth: Payer: Self-pay | Admitting: Family Medicine

## 2016-03-07 ENCOUNTER — Encounter: Payer: Self-pay | Admitting: Family

## 2016-03-07 VITALS — BP 117/57 | HR 40 | Ht 69.0 in | Wt 159.0 lb

## 2016-03-07 DIAGNOSIS — I6523 Occlusion and stenosis of bilateral carotid arteries: Secondary | ICD-10-CM

## 2016-03-07 DIAGNOSIS — Z9889 Other specified postprocedural states: Secondary | ICD-10-CM | POA: Diagnosis present

## 2016-03-07 DIAGNOSIS — Z87891 Personal history of nicotine dependence: Secondary | ICD-10-CM | POA: Diagnosis not present

## 2016-03-07 DIAGNOSIS — I129 Hypertensive chronic kidney disease with stage 1 through stage 4 chronic kidney disease, or unspecified chronic kidney disease: Secondary | ICD-10-CM | POA: Insufficient documentation

## 2016-03-07 DIAGNOSIS — R001 Bradycardia, unspecified: Secondary | ICD-10-CM

## 2016-03-07 DIAGNOSIS — E785 Hyperlipidemia, unspecified: Secondary | ICD-10-CM | POA: Insufficient documentation

## 2016-03-07 DIAGNOSIS — Z48812 Encounter for surgical aftercare following surgery on the circulatory system: Secondary | ICD-10-CM

## 2016-03-07 DIAGNOSIS — Z720 Tobacco use: Secondary | ICD-10-CM | POA: Diagnosis not present

## 2016-03-07 DIAGNOSIS — N182 Chronic kidney disease, stage 2 (mild): Secondary | ICD-10-CM | POA: Insufficient documentation

## 2016-03-07 DIAGNOSIS — F17200 Nicotine dependence, unspecified, uncomplicated: Secondary | ICD-10-CM

## 2016-03-07 LAB — VAS US CAROTID
LCCADDIAS: 19 cm/s
LCCAPSYS: 67 cm/s
LEFT ECA DIAS: -6 cm/s
Left CCA dist sys: 132 cm/s
Left CCA prox dias: 8 cm/s
Left ICA dist dias: -18 cm/s
Left ICA dist sys: -66 cm/s
RCCADSYS: -61 cm/s
RCCAPDIAS: 11 cm/s
RIGHT CCA MID DIAS: -14 cm/s
RIGHT ECA DIAS: -17 cm/s
RIGHT VERTEBRAL DIAS: -10 cm/s
Right CCA prox sys: 67 cm/s

## 2016-03-07 NOTE — Telephone Encounter (Signed)
Patient had an appointment with his cardiologist today & his bp was too low. The cardiologist advised the patient to call our office to change his dosing. Please call back.

## 2016-03-07 NOTE — Progress Notes (Signed)
Chief Complaint: Follow up Extracranial Carotid Artery Stenosis   History of Present Illness  Jeffrey [name] is a 75 y.o. male patient of Dr. Kellie Simmering with a history of a left CEA in April 2010. He returns today for routine surveillance. He reports that he has had his kidney arteries checked due to uncontrolled hypertension, states this was not the problem; his antihypertensive medications are chronically adjusted.  Patient has Negative history of TIA or stroke symptom. The patient denies amaurosis fugax or monocular blindness. The patient denies facial drooping. Pt. denies hemiplegia. The patient denies receptive or expressive aphasia.   He denies claudication symptoms in legs with walking, denies non healing wounds. 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 He denies New Medical or Surgical History.  Pt Diabetic: No Pt smoker: smoker (2/3 ppd, started smoking at about age 71 yrs)  Pt meds include: Statin : Yes ASA: has forgotten to take daily 81 mg ASA Other anticoagulants/antiplatelets: no    Past Medical History  Diagnosis Date  . Carotid artery occlusion     carotid dopplers 02/06/12-patent left carotid endarterectomy; right internal carotid 40-59%  . Hypertension   . Chronic renal insufficiency, stage II (mild)     Borderline stage II/III--CrCl @ 62.  No RAS on 2003 angiogram (done for + renal artery dopplers--deemed eventually to have been a false positive).  Dr. Gwenlyn Found.  . Hyperlipidemia   . Adenomatous colon polyp     Dr. Laural Golden (28 polyps on 1st endo, 3 on 2nd endo a year later.  8 on colonoscopy 02/26/14--recall 3 yrs.  Marland Kitchen BPH (benign prostatic hyperplasia)     Dr. Lum Babe (pt is asymptomatic)  . Plantar wart of left foot 12/21/2012  . Solitary pulmonary nodule 03/14/2013    73m RUL 03/08/13--stable on repeat 07/2013, 03/2015, and 01/2016.  No additional imaging is required.  . DDD (degenerative disc  disease), lumbar 02/2013    with spondylosis and moderate spinal stenosis+ left L5 and S1 nerve root impingement  . Fatty liver 2006    Noted on abd u/s and renal u/s 2006/2007  . Aortic insufficiency     echo 03/25/12-EF>55%, mild-mod Aortic regurg, sclerotic aortic valve,   . SOB (shortness of breath)     MET test 03/25/12-mildly abnormal  . Tobacco dependence     ongoing as of 02/2016  . Vitamin B12 deficiency without anemia 2016    Intrinsic factor NEG: home vit B12 injections started 11/17/14  . [place]ic [place]    and paresthesias (feet); suspected due to B12 deficiency  . Essential tremor     Social History Social History  Substance Use Topics  . Smoking status: Current Every Day Smoker -- 1.00 packs/day for 55 years    Types: Cigarettes  . Smokeless tobacco: Never Used  . Alcohol Use: No    Family History Family History  Problem Relation Age of Onset  . Coronary artery disease Mother   . Stroke Mother   . Diabetes Mother   . Hypertension Mother   . Parkinson's disease Mother   . Colon cancer Neg Hx     Surgical History Past Surgical History  Procedure Laterality Date  . Carotid endarterectomy  11/25/08    Left     ICA  . Appendectomy  remote  . Arm surgery  July 2010    Left arm cyst/Lipoma  . Umbilical hernia repair    .  Tonsillectomy  remote  . Colonoscopy N/A 02/26/2014    Recall 3 yrs: (+ polypectomy--tubular adenoma) Procedure: COLONOSCOPY;  Surgeon: Rogene Houston, MD;  [facility];  Service: Endoscopy;  Laterality: N/A;  830    Allergies  Allergen Reactions  . Clonidine Derivatives Rash    Current Outpatient Prescriptions  Medication Sig Dispense Refill  . aspirin 81 MG tablet Take 81 mg by mouth daily.    Marland Kitchen atorvastatin (LIPITOR) 80 MG tablet Take 1 tablet (80 mg total) by mouth daily. 90 tablet 3  . gabapentin (NEURONTIN) 300 MG capsule 1 cap po qAM, 1 cap po q afternoon, and 2 caps po qhs 120 capsule 6  .  losartan (COZAAR) 100 MG tablet Take 1 tablet (100 mg total) by mouth daily. 90 tablet 3  . metoprolol succinate (TOPROL-XL) 100 MG 24 hr tablet Take 1 tablet (100 mg total) by mouth daily. Take with or immediately following a meal. 90 tablet 3  . ofloxacin (FLOXIN) 0.3 % otic solution Place 10 drops into the left ear daily. 5 mL 1  . omeprazole (PRILOSEC) 20 MG capsule Take 20 mg by mouth daily as needed (heartburn).     . primidone (MYSOLINE) 50 MG tablet TAKE 1 TABLET BY MOUTH TWICE DAILY 180 tablet 1  . varenicline (CHANTIX STARTING MONTH PAK) 0.5 MG X 11 & 1 MG X 42 tablet Take one 0.5 mg tablet by mouth once daily for 3 days, then increase to one 0.5 mg tablet twice daily for 4 days, then increase to one 1 mg tablet twice daily. 53 tablet 0   No current facility-administered medications for this visit.    Review of Systems : See HPI for pertinent positives and negatives.  Physical Examination  Filed Vitals:   03/07/16 1054 03/07/16 1055  BP: 115/57 117/57  Pulse: 40   Height: _0  (1.753 m)   Weight: 159 lb (72.122 kg)   SpO2: 100%    Body mass index is 23.47 kg/(m^2).  General: WDWN male in NAD GAIT: normal Eyes: PERRLA Pulmonary: Respirations are non-labored, CTAB Cardiac: Regular bradycardic rhythm,positive  murmur.  VASCULAR EXAM Carotid Bruits Left Right   Transmitted cardiac murmur Transmitted cardiac murmur   Aorta is not palpable. Radial pulses are 2+ palpable and equal.      LE Pulses LEFT RIGHT   POPLITEAL not palpable  not palpable   DORSALIS PEDIS  ANTERIOR TIBIAL 2+ palpable  2+ palpable     Gastrointestinal: soft, nontender, BS WNL, no r/g,no palable masses.  Musculoskeletal: No muscle atrophy/wasting. M/S 5/5 throughout, Extremities without ischemic changes  Neurologic:  A&O X 3; Appropriate Affect, Speech is normal CN 2-12 intact except some hearing loss, Pain and light touch intact in extremities, Motor exam as listed above.                Non-Invasive Vascular Imaging CAROTID DUPLEX 03/07/2016   CEREBROVASCULAR DUPLEX EVALUATION    INDICATION: Carotid artery disease    PREVIOUS INTERVENTION(S): Left carotid endarterectomy 12/23/2008    DUPLEX EXAM: Carotid duplex    RIGHT  LEFT  Peak Systolic Velocities (cm/s) End Diastolic Velocities (cm/s) Plaque LOCATION Peak Systolic Velocities (cm/s) End Diastolic Velocities (cm/s) Plaque  67 11  CCA PROXIMAL 67 8 HT  158 23 HT CCA MID 92 15 HT  105 19  CCA DISTAL 140 19 HT  202 17 HT ECA 68 6 HM  288 19 CP ICA PROXIMAL 203 51 HT  101 18  ICA MID 86 20   61 16  ICA DISTAL 66 18     1.8 ICA / CCA Ratio (PSV) NA  Antegrade Vertebral Flow Antegrade, atypical  - Brachial Systolic Pressure (mmHg) -  Triphasic Brachial Artery Waveforms Triphasic    Plaque Morphology:  HM = Homogeneous, HT = Heterogeneous, CP = Calcific Plaque, SP = Smooth Plaque, IP = Irregular Plaque     ADDITIONAL FINDINGS: Multiphasic subclavian arteries    IMPRESSION: 1. 1 - 39% right internal carotid artery stenosis, probably upper end of range, calcific plaque may obscure higher velocity. 2. 40 - 59% left internal carotid artery stenosis. 3. Atypical left vertebral artery waveform    Compared to the previous exam:  No significant change since exam of 03/03/2015      Assessment: WISSAM RESOR is a 74 y.o. male who is s/p left CEA in April 2010. He has no history of stroke of TIA.  Today's carotid Duplex suggests 1-39% right internal carotid artery stenosis, probably upper end of range, calcific plaque may obscure higher velocity. 21 - 59% left internal carotid artery stenosis. No significant change since exam of 03/03/2015.   His atherosclerotic risk factors include recent former smoker. I congratulated him re this and  encouraged him to remain tobacco free. Fortunately he does not have DM.  His heart rate is 40, blood pressure is significantly less this visit than a year ago. I advised pt to see his PCP today re his low heart rate and lower blood pressure. He also states he is feeling sluggish lately.   Plan:   Follow-up in 1 year with Carotid Duplex scan.   I discussed in depth with the patient the nature of atherosclerosis, and emphasized the importance of maximal medical management including strict control of blood pressure, blood glucose, and lipid levels, obtaining regular exercise, and cessation of smoking.  The patient is aware that without maximal medical management the underlying atherosclerotic disease process will progress, limiting the benefit of any interventions. The patient was given information about stroke prevention and what symptoms should prompt the patient to seek immediate medical care. Thank you for allowing [place] to participate in this patient's care.  Clemon Chambers, RN, MSN, FNP-C [facility] Office: [phone]  Clinic Physician: Kellie Simmering  03/07/2016 11:09 AM

## 2016-03-07 NOTE — Patient Instructions (Addendum)
Stroke Prevention Some medical conditions and behaviors are associated with an increased chance of having a stroke. You may prevent a stroke by making healthy choices and managing medical conditions. HOW CAN I REDUCE MY RISK OF HAVING A STROKE?   Stay physically active. Get at least 30 minutes of activity on most or all days.  Do not smoke. It may also be helpful to avoid exposure to secondhand smoke.  Limit alcohol use. Moderate alcohol use is considered to be:  No more than 2 drinks per day for men.  No more than 1 drink per day for nonpregnant women.  Eat healthy foods. This involves:  Eating 5 or more servings of fruits and vegetables a day.  Making dietary changes that address high blood pressure (hypertension), high cholesterol, diabetes, or obesity.  Manage your cholesterol levels.  Making food choices that are high in fiber and low in saturated fat, trans fat, and cholesterol may control cholesterol levels.  Take any prescribed medicines to control cholesterol as directed by your health care provider.  Manage your diabetes.  Controlling your carbohydrate and sugar intake is recommended to manage diabetes.  Take any prescribed medicines to control diabetes as directed by your health care provider.  Control your hypertension.  Making food choices that are low in salt (sodium), saturated fat, trans fat, and cholesterol is recommended to manage hypertension.  Ask your health care provider if you need treatment to lower your blood pressure. Take any prescribed medicines to control hypertension as directed by your health care provider.  If you are 18-39 years of age, have your blood pressure checked every 3-5 years. If you are 40 years of age or older, have your blood pressure checked every year.  Maintain a healthy weight.  Reducing calorie intake and making food choices that are low in sodium, saturated fat, trans fat, and cholesterol are recommended to manage  weight.  Stop drug abuse.  Avoid taking birth control pills.  Talk to your health care provider about the risks of taking birth control pills if you are over 35 years old, smoke, get migraines, or have ever had a blood clot.  Get evaluated for sleep disorders (sleep apnea).  Talk to your health care provider about getting a sleep evaluation if you snore a lot or have excessive sleepiness.  Take medicines only as directed by your health care provider.  For some people, aspirin or blood thinners (anticoagulants) are helpful in reducing the risk of forming abnormal blood clots that can lead to stroke. If you have the irregular heart rhythm of atrial fibrillation, you should be on a blood thinner unless there is a good reason you cannot take them.  Understand all your medicine instructions.  Make sure that other conditions (such as anemia or atherosclerosis) are addressed. SEEK IMMEDIATE MEDICAL CARE IF:   You have sudden weakness or numbness of the face, arm, or leg, especially on one side of the body.  Your face or eyelid droops to one side.  You have sudden confusion.  You have trouble speaking (aphasia) or understanding.  You have sudden trouble seeing in one or both eyes.  You have sudden trouble walking.  You have dizziness.  You have a loss of balance or coordination.  You have a sudden, severe headache with no known cause.  You have new chest pain or an irregular heartbeat. Any of these symptoms may represent a serious problem that is an emergency. Do not wait to see if the symptoms will   go away. Get medical help at once. Call your local emergency services (911 in [place] Do not drive yourself to the hospital.   This information is not intended to replace advice given to you by your health care provider. Make sure you discuss any questions you have with your health care provider.   Document Released: 10/05/2004 Document Revised: 09/18/2014 Document Reviewed:  02/28/2013 Elsevier Interactive Patient Education 2016 Elsevier Inc.     Steps to Quit Smoking  Smoking tobacco can be harmful to your health and can affect almost every organ in your body. Smoking puts you, and those around you, at risk for developing many serious chronic diseases. Quitting smoking is difficult, but it is one of the best things that you can do for your health. It is never too late to quit. WHAT ARE THE BENEFITS OF QUITTING SMOKING? When you quit smoking, you lower your risk of developing serious diseases and conditions, such as:  Lung cancer or lung disease, such as COPD.  Heart disease.  Stroke.  Heart attack.  Infertility.  Osteoporosis and bone fractures. Additionally, symptoms such as coughing, wheezing, and shortness of breath may get better when you quit. You may also find that you get sick less often because your body is stronger at fighting off colds and infections. If you are pregnant, quitting smoking can help to reduce your chances of having a baby of low birth weight. HOW DO I GET READY TO QUIT? When you decide to quit smoking, create a plan to make sure that you are successful. Before you quit:  Pick a date to quit. Set a date within the next two weeks to give you time to prepare.  Write down the reasons why you are quitting. Keep this list in places where you will see it often, such as on your bathroom mirror or in your car or wallet.  Identify the people, places, things, and activities that make you want to smoke (triggers) and avoid them. Make sure to take these actions:  Throw away all cigarettes at home, at work, and in your car.  Throw away smoking accessories, such as ashtrays and lighters.  Clean your car and make sure to empty the ashtray.  Clean your home, including curtains and carpets.  Tell your family, friends, and coworkers that you are quitting. Support from your loved ones can make quitting easier.  Talk with your health care  provider about your options for quitting smoking.  Find out what treatment options are covered by your health insurance. WHAT STRATEGIES CAN I USE TO QUIT SMOKING?  Talk with your healthcare provider about different strategies to quit smoking. Some strategies include:  Quitting smoking altogether instead of gradually lessening how much you smoke over a period of time. Research shows that quitting "cold turkey" is more successful than gradually quitting.  Attending in-person counseling to help you build problem-solving skills. You are more likely to have success in quitting if you attend several counseling sessions. Even short sessions of 10 minutes can be effective.  Finding resources and support systems that can help you to quit smoking and remain smoke-free after you quit. These resources are most helpful when you use them often. They can include:  Online chats with a counselor.  Telephone quitlines.  Printed self-help materials.  Support groups or group counseling.  Text messaging programs.  Mobile phone applications.  Taking medicines to help you quit smoking. (If you are pregnant or breastfeeding, talk with your health care provider first.) Some   medicines contain nicotine and some do not. Both types of medicines help with cravings, but the medicines that include nicotine help to relieve withdrawal symptoms. Your health care provider may recommend:  Nicotine patches, gum, or lozenges.  Nicotine inhalers or sprays.  Non-nicotine medicine that is taken by mouth. Talk with your health care provider about combining strategies, such as taking medicines while you are also receiving in-person counseling. Using these two strategies together makes you more likely to succeed in quitting than if you used either strategy on its own. If you are pregnant or breastfeeding, talk with your health care provider about finding counseling or other support strategies to quit smoking. Do not take  medicine to help you quit smoking unless told to do so by your health care provider. WHAT THINGS CAN I DO TO MAKE IT EASIER TO QUIT? Quitting smoking might feel overwhelming at first, but there is a lot that you can do to make it easier. Take these important actions:  Reach out to your family and friends and ask that they support and encourage you during this time. Call telephone quitlines, reach out to support groups, or work with a counselor for support.  Ask people who smoke to avoid smoking around you.  Avoid places that trigger you to smoke, such as bars, parties, or smoke-break areas at work.  Spend time around people who do not smoke.  Lessen stress in your life, because stress can be a smoking trigger for some people. To lessen stress, try:  Exercising regularly.  Deep-breathing exercises.  Yoga.  Meditating.  Performing a body scan. This involves closing your eyes, scanning your body from head to toe, and noticing which parts of your body are particularly tense. Purposefully relax the muscles in those areas.  Download or purchase mobile phone or tablet apps (applications) that can help you stick to your quit plan by providing reminders, tips, and encouragement. There are many free apps, such as QuitGuide from the CDC (Centers for Disease Control and Prevention). You can find other support for quitting smoking (smoking cessation) through smokefree.gov and other websites. HOW WILL I FEEL WHEN I QUIT SMOKING? Within the first 24 hours of quitting smoking, you may start to feel some withdrawal symptoms. These symptoms are usually most noticeable 2-3 days after quitting, but they usually do not last beyond 2-3 weeks. Changes or symptoms that you might experience include:  Mood swings.  Restlessness, anxiety, or irritation.  Difficulty concentrating.  Dizziness.  Strong cravings for sugary foods in addition to nicotine.  Mild weight  gain.  Constipation.  Nausea.  Coughing or a sore throat.  Changes in how your medicines work in your body.  A depressed mood.  Difficulty sleeping (insomnia). After the first 2-3 weeks of quitting, you may start to notice more positive results, such as:  Improved sense of smell and taste.  Decreased coughing and sore throat.  Slower heart rate.  Lower blood pressure.  Clearer skin.  The ability to breathe more easily.  Fewer sick days. Quitting smoking is very challenging for most people. Do not get discouraged if you are not successful the first time. Some people need to make many attempts to quit before they achieve long-term success. Do your best to stick to your quit plan, and talk with your health care provider if you have any questions or concerns.   This information is not intended to replace advice given to you by your health care provider. Make sure you discuss any questions   you have with your health care provider.   Document Released: 08/22/2001 Document Revised: 01/12/2015 Document Reviewed: 01/12/2015 Elsevier Interactive Patient Education 2016 Elsevier Inc.  

## 2016-03-07 NOTE — Telephone Encounter (Signed)
Contact pt and have him start taking only 1/2 of his metoprolol (toprol xl) tab once daily.  No other changes. Monitor bp and heart rate daily.  Call with report of bp's and heart rates OR f/u in office in 7-10 days.--thx

## 2016-03-07 NOTE — Telephone Encounter (Signed)
Pts daughter advised and voiced understanding, okay per DPR. 

## 2016-03-07 NOTE — Telephone Encounter (Signed)
Please advise. Thanks.  

## 2016-03-23 ENCOUNTER — Ambulatory Visit (INDEPENDENT_AMBULATORY_CARE_PROVIDER_SITE_OTHER): Payer: Medicare Other | Admitting: Family Medicine

## 2016-03-23 ENCOUNTER — Encounter: Payer: Self-pay | Admitting: Family Medicine

## 2016-03-23 VITALS — BP 123/82 | HR 47 | Temp 98.1°F | Resp 16 | Ht 69.0 in | Wt 160.2 lb

## 2016-03-23 DIAGNOSIS — I10 Essential (primary) hypertension: Secondary | ICD-10-CM

## 2016-03-23 DIAGNOSIS — R001 Bradycardia, unspecified: Secondary | ICD-10-CM

## 2016-03-23 DIAGNOSIS — Z8669 Personal history of other diseases of the nervous system and sense organs: Secondary | ICD-10-CM

## 2016-03-23 DIAGNOSIS — Z09 Encounter for follow-up examination after completed treatment for conditions other than malignant neoplasm: Secondary | ICD-10-CM

## 2016-03-23 DIAGNOSIS — Z716 Tobacco abuse counseling: Secondary | ICD-10-CM | POA: Diagnosis not present

## 2016-03-23 DIAGNOSIS — Z720 Tobacco use: Secondary | ICD-10-CM

## 2016-03-23 DIAGNOSIS — R0989 Other specified symptoms and signs involving the circulatory and respiratory systems: Secondary | ICD-10-CM

## 2016-03-23 MED ORDER — GABAPENTIN 300 MG PO CAPS: ORAL_CAPSULE | ORAL | Status: DC

## 2016-03-23 MED ORDER — VARENICLINE TARTRATE 1 MG PO TABS: 1.0000 mg | ORAL_TABLET | Freq: Two times a day (BID) | ORAL | Status: DC

## 2016-03-23 MED ORDER — ATORVASTATIN CALCIUM 80 MG PO TABS: 80.0000 mg | ORAL_TABLET | Freq: Every day | ORAL | Status: DC

## 2016-03-23 MED ORDER — LOSARTAN POTASSIUM 100 MG PO TABS: 100.0000 mg | ORAL_TABLET | Freq: Every day | ORAL | Status: DC

## 2016-03-23 NOTE — Addendum Note (Signed)
Addended by: Onalee Hua on: 03/23/2016 12:48 PM   Modules accepted: Orders

## 2016-03-23 NOTE — Progress Notes (Signed)
OFFICE VISIT  03/23/2016   CC:  Chief Complaint  Patient presents with  . Follow-up    Ear and smoking   HPI:    Patient is a 75 y.o. Caucasian male who presents for f/u recent left ear infection and f/u smoking cessation. Has been on chantix for 1 mo now and has cut down to 1-2 cigs/day, says he thinks he is going to be successful.  Left ear feels pretty good, still some on/off hearing difficulty.  He took all the abx pills and drops I rx'd.  BP checks in last 2 wks are normal or low normal, HR 40s-60s.  No dizziness, CP, or SOB.  Past Medical History  Diagnosis Date  . Carotid artery occlusion     carotid dopplers 02/06/12-patent left carotid endarterectomy; right internal carotid 40-59%  . Hypertension   . Chronic renal insufficiency, stage II (mild)     Borderline stage II/III--CrCl @ 62.  No RAS on 2003 angiogram (done for + renal artery dopplers--deemed eventually to have been a false positive).  Dr. Gwenlyn Found.  . Hyperlipidemia   . Adenomatous colon polyp     Dr. Laural Golden (28 polyps on 1st endo, 3 on 2nd endo a year later.  8 on colonoscopy 02/26/14--recall 3 yrs.  Marland Kitchen BPH (benign prostatic hyperplasia)     Dr. Lum Babe (pt is asymptomatic)  . Plantar wart of left foot 12/21/2012  . Solitary pulmonary nodule 03/14/2013    40m RUL 03/08/13--stable on repeat 07/2013, 03/2015, and 01/2016.  No additional imaging is required.  . DDD (degenerative disc disease), lumbar 02/2013    with spondylosis and moderate spinal stenosis+ left L5 and S1 nerve root impingement  . Fatty liver 2006    Noted on abd u/s and renal u/s 2006/2007  . Aortic insufficiency     echo 03/25/12-EF>55%, mild-mod Aortic regurg, sclerotic aortic valve,   . SOB (shortness of breath)     MET test 03/25/12-mildly abnormal  . Tobacco dependence     ongoing as of 02/2016  . Vitamin B12 deficiency without anemia 2016    Intrinsic factor NEG: home vit B12 injections started 11/17/14  . Peripheral neuropathic pain (HTatums 2016   and paresthesias (feet); suspected due to B12 deficiency  . Essential tremor     Past Surgical History  Procedure Laterality Date  . Carotid endarterectomy  11/25/08    Left     ICA  . Appendectomy  remote  . Arm surgery  July 2010    Left arm cyst/Lipoma  . Umbilical hernia repair    . Tonsillectomy  remote  . Colonoscopy N/A 02/26/2014    Recall 3 yrs: (+ polypectomy--tubular adenoma) Procedure: COLONOSCOPY;  Surgeon: NRogene Houston MD;  Location: [facility];  Service: Endoscopy;  Laterality: N/A;  830  . Transthoracic echocardiogram  03/2012    EF 55%, normal LV syst fxn, impaired diast relaxation, mild/mod aortic regurg  . Carotid dopplers  02/2015; 02/2016    2017:  R ICA 1-39%, L ICA 40-59%: no change from 2016.    Outpatient Prescriptions Prior to Visit  Medication Sig Dispense Refill  . aspirin 81 MG tablet Take 81 mg by mouth daily.    .Marland Kitchenatorvastatin (LIPITOR) 80 MG tablet Take 1 tablet (80 mg total) by mouth daily. 90 tablet 3  . gabapentin (NEURONTIN) 300 MG capsule 1 cap po qAM, 1 cap po q afternoon, and 2 caps po qhs 120 capsule 6  . losartan (COZAAR) 100 MG tablet Take  1 tablet (100 mg total) by mouth daily. 90 tablet 3  . omeprazole (PRILOSEC) 20 MG capsule Take 20 mg by mouth daily as needed (heartburn).     . primidone (MYSOLINE) 50 MG tablet TAKE 1 TABLET BY MOUTH TWICE DAILY 180 tablet 1  . metoprolol succinate (TOPROL-XL) 100 MG 24 hr tablet Take 1 tablet (100 mg total) by mouth daily. Take with or immediately following a meal. 90 tablet 3  . varenicline (CHANTIX STARTING MONTH PAK) 0.5 MG X 11 & 1 MG X 42 tablet Take one 0.5 mg tablet by mouth once daily for 3 days, then increase to one 0.5 mg tablet twice daily for 4 days, then increase to one 1 mg tablet twice daily. 53 tablet 0  . ofloxacin (FLOXIN) 0.3 % otic solution Place 10 drops into the left ear daily. (Patient not taking: Reported on 03/23/2016) 5 mL 1   No facility-administered medications prior to  visit.    Allergies  Allergen Reactions  . Clonidine Derivatives Rash    ROS As per HPI  PE: Blood pressure 123/82, pulse 47, temperature 98.1 F (36.7 C), temperature source Oral, resp. rate 16, height _0  (1.753 m), weight 160 lb 4 oz (72.689 kg), SpO2 100 %. Gen: Alert, well appearing.  Patient is oriented to person, place, time, and situation. Both ears with some scattered cerumen that looks hard/crusty.  In left ear canal it appears to be abutting the TM. No sign of infection in either ear.  LABS:  None today  IMPRESSION AND PLAN:  1) Tobacco cessation: doing well on chantix, nearly completely quit. Rx for continuation dosing sent to pharmacy today.  2) Left ear infection with perf: resolved.  TM appears intact.   Recommended debrox otc drops daily to try to dissolve some of his cerumen.  3) HTN, labile.  BP's lately actually more on the low normal side, with bradycardia that is not symptomatic. He has cut his toprol dose in half already.  Will go ahead and d/c toprol XL altogether today. Continue home bp monitoring.  An After Visit Summary was printed and given to the patient.  FOLLOW UP: Return for keep appt set for 05/22/16.  Signed:  Crissie Sickles, MD           03/23/2016

## 2016-03-23 NOTE — Progress Notes (Signed)
Pre visit review using our clinic review tool, if applicable. No additional management support is needed unless otherwise documented below in the visit note. 

## 2016-03-24 ENCOUNTER — Ambulatory Visit (INDEPENDENT_AMBULATORY_CARE_PROVIDER_SITE_OTHER): Payer: Medicare Other | Admitting: *Deleted

## 2016-03-24 DIAGNOSIS — E538 Deficiency of other specified B group vitamins: Secondary | ICD-10-CM

## 2016-03-24 MED ORDER — CYANOCOBALAMIN 1000 MCG/ML IJ SOLN
1000.0000 ug | Freq: Once | INTRAMUSCULAR | Status: AC
  Administered 2016-03-24: 1000 ug via INTRAMUSCULAR

## 2016-03-24 NOTE — Progress Notes (Signed)
Patient presents today for B 12 injection per Dr Idelle Leech orders.

## 2016-04-24 ENCOUNTER — Ambulatory Visit (INDEPENDENT_AMBULATORY_CARE_PROVIDER_SITE_OTHER): Payer: Medicare Other | Admitting: Family Medicine

## 2016-04-24 DIAGNOSIS — E538 Deficiency of other specified B group vitamins: Secondary | ICD-10-CM

## 2016-04-24 MED ORDER — CYANOCOBALAMIN 1000 MCG/ML IJ SOLN
1000.0000 ug | Freq: Once | INTRAMUSCULAR | Status: AC
  Administered 2017-05-15: 1000 ug via INTRAMUSCULAR

## 2016-05-22 ENCOUNTER — Ambulatory Visit (INDEPENDENT_AMBULATORY_CARE_PROVIDER_SITE_OTHER): Payer: Medicare Other | Admitting: Family Medicine

## 2016-05-22 DIAGNOSIS — E538 Deficiency of other specified B group vitamins: Secondary | ICD-10-CM

## 2016-05-22 MED ORDER — CYANOCOBALAMIN 1000 MCG/ML IJ SOLN
1000.0000 ug | Freq: Once | INTRAMUSCULAR | Status: AC
  Administered 2016-05-22: 1000 ug via INTRAMUSCULAR

## 2016-05-24 ENCOUNTER — Ambulatory Visit: Payer: Medicare Other

## 2016-06-21 ENCOUNTER — Ambulatory Visit (INDEPENDENT_AMBULATORY_CARE_PROVIDER_SITE_OTHER): Payer: Medicare Other | Admitting: Family Medicine

## 2016-06-21 DIAGNOSIS — Z23 Encounter for immunization: Secondary | ICD-10-CM | POA: Diagnosis not present

## 2016-06-21 DIAGNOSIS — E538 Deficiency of other specified B group vitamins: Secondary | ICD-10-CM | POA: Diagnosis not present

## 2016-06-21 MED ORDER — CYANOCOBALAMIN 1000 MCG/ML IJ SOLN
1000.0000 ug | Freq: Once | INTRAMUSCULAR | Status: AC
  Administered 2016-06-21: 1000 ug via INTRAMUSCULAR

## 2016-06-25 NOTE — Progress Notes (Signed)
Pt with Vit b12 deficiency. Vit B12 1000 mcg IM today.  Signed:  Crissie Sickles, MD           06/25/2016

## 2016-07-24 ENCOUNTER — Ambulatory Visit (INDEPENDENT_AMBULATORY_CARE_PROVIDER_SITE_OTHER): Payer: Medicare Other | Admitting: Family Medicine

## 2016-07-24 ENCOUNTER — Encounter: Payer: Self-pay | Admitting: Family Medicine

## 2016-07-24 VITALS — BP 166/80 | HR 55 | Temp 98.6°F | Resp 18 | Wt 147.0 lb

## 2016-07-24 DIAGNOSIS — E538 Deficiency of other specified B group vitamins: Secondary | ICD-10-CM | POA: Diagnosis not present

## 2016-07-24 DIAGNOSIS — Z0000 Encounter for general adult medical examination without abnormal findings: Secondary | ICD-10-CM | POA: Diagnosis not present

## 2016-07-24 DIAGNOSIS — H9193 Unspecified hearing loss, bilateral: Secondary | ICD-10-CM | POA: Diagnosis not present

## 2016-07-24 DIAGNOSIS — F17200 Nicotine dependence, unspecified, uncomplicated: Secondary | ICD-10-CM | POA: Diagnosis not present

## 2016-07-24 MED ORDER — CYANOCOBALAMIN 1000 MCG/ML IJ SOLN
1000.0000 ug | Freq: Once | INTRAMUSCULAR | Status: AC
  Administered 2016-07-24: 1000 ug via INTRAMUSCULAR

## 2016-07-24 NOTE — Addendum Note (Signed)
Addended by: Gordy Councilman on: 07/24/2016 11:23 AM   Modules accepted: Orders

## 2016-07-24 NOTE — Progress Notes (Signed)
Pre visit review using our clinic review tool, if applicable. No additional management support is needed unless otherwise documented below in the visit note. 

## 2016-07-24 NOTE — Progress Notes (Signed)
The patient is here for annual Medicare wellness examination and management of other chronic and acute problems. Other problems discussed today: none  Lab Results  Component Value Date   HGBA1C 5.8 (H) 11/12/2014    Lab Results  Component Value Date   TSH 2.28 11/12/2014   Lab Results  Component Value Date   WBC 8.5 11/12/2014   HGB 14.9 11/12/2014   HCT 43.1 11/12/2014   MCV 85.8 11/12/2014   PLT 225.0 11/12/2014   Lab Results  Component Value Date   CREATININE 1.10 01/04/2016   BUN 9 01/04/2016   NA 138 01/04/2016   K 4.6 01/04/2016   CL 101 01/04/2016   CO2 31 01/04/2016   Lab Results  Component Value Date   ALT 9 01/04/2016   AST 12 01/04/2016   ALKPHOS 100 01/04/2016   BILITOT 0.7 01/04/2016   Lab Results  Component Value Date   CHOL 142 02/22/2016   Lab Results  Component Value Date   HDL 37.90 (L) 02/22/2016   Lab Results  Component Value Date   LDLCALC 87 02/22/2016   Lab Results  Component Value Date   TRIG 84.0 02/22/2016   Lab Results  Component Value Date   CHOLHDL 4 02/22/2016   Lab Results  Component Value Date   PSA 0.85 12/23/2012     AWV DATA The risk factors are reflected in the social history.  The roster of all physicians providing medical care to patient is listed in the Snapshot section of the chart.  Activities of daily living:  The patient is 100% independent in all ADLs: dressing, toileting, feeding as well as independent mobility.  Home safety : The patient has smoke detectors in the home. They wear seatbelts. No firearms at home ( firearms are present in the home, kept in a safe fashion). There is no violence in the home.   There is no risks for hepatitis, STDs or HIV. There is no history of blood transfusion. They have no travel history to infectious disease endemic areas of the world.  The patient has not seen their dentist in the last six month. They have seen their eye doctor in the last year. They admit to hearing  difficulty and have not had audiologic testing in the last year.  They do not  have excessive sun exposure. Discussed the need for sun protection: hats, long sleeves and use of sunscreen if there is significant sun exposure.   Diet: the importance of a healthy diet is discussed. They do have a healthy (unhealthy-high fat/fast food) diet.  He is active/not sedentary. But has no exercise regimen.  The benefits of regular aerobic exercise were discussed.  Depression screen: there are no signs or vegative symptoms of depression- irritability, change in appetite, anhedonia, sadness/tearfullness.  Depression screen PHQ 2/9 07/24/2016  Decreased Interest 0  Down, Depressed, Hopeless 0  PHQ - 2 Score 0    Fall Risk  07/24/2016 07/24/2016 01/04/2016  Falls in the past year? No No No     Cognitive assessment: the patient manages all their financial and personal affairs and is actively engaged. They could relate day,date,year and events; recalled 3/3 objects at 3 minutes; performed clock-face test normally.  Reviewed advance directives with pt today.  The following portions of the patient's history were reviewed and updated as appropriate: allergies, current medications, past family history, past medical history,  past surgical history, past social history  and problem list.  Vision, hearing, body mass index were assessed  and reviewed. Body mass index is 21.71 kg/m.   During the course of the visit the patient was educated and counseled about appropriate screening and preventive services including :  Annual wellness visit : doing currently. diabetes screening: done 12/2015.  Will do again after 12/2016. colorectal cancer screening: due 02/2017. recommended immunizations (influenza, pneumococcal, Hep B): pt UTD Bone mass measurement: n/a Counseling to prevent tobacco use: discussed. Depression screening: done today Glaucoma screening: UTD via ophthalmologist Hepatitis C virus screening: doesn't  qualify HIV virus screening: pt doesn't want this test. Lung cancer screening: pt has had further eval of pulm nodule and it has been determined that no further testing/f/u imaging is needed. Medical nutrition therapy: n/a Prostate cancer screening: pt declines any further prostate cancer screening Screening mammography: n/a Screening pap tests, pelvic exam, and clinical breast exam: n/a Ultrasound screening for AAA  A written plan of action regarding the above screening and preventative services was given to the patient today.  PLAN: referred pt to [facility].  Pt to get next TCS 02/2017.  Next diab screening 12/2016, ordered screening aortic u/s.

## 2016-08-08 ENCOUNTER — Ambulatory Visit (HOSPITAL_COMMUNITY)
Admission: RE | Admit: 2016-08-08 | Discharge: 2016-08-08 | Disposition: A | Discharge status: Home / Self Care | Payer: Medicare Other | Source: Ambulatory Visit | Attending: Family Medicine | Admitting: Family Medicine

## 2016-08-08 ENCOUNTER — Encounter: Payer: Self-pay | Admitting: Family Medicine

## 2016-08-08 DIAGNOSIS — I77811 Abdominal aortic ectasia: Secondary | ICD-10-CM

## 2016-08-08 DIAGNOSIS — F17200 Nicotine dependence, unspecified, uncomplicated: Secondary | ICD-10-CM | POA: Diagnosis present

## 2016-08-08 HISTORY — DX: Abdominal aortic ectasia: I77.811

## 2016-08-23 ENCOUNTER — Ambulatory Visit (INDEPENDENT_AMBULATORY_CARE_PROVIDER_SITE_OTHER): Payer: Medicare Other

## 2016-08-23 DIAGNOSIS — E538 Deficiency of other specified B group vitamins: Secondary | ICD-10-CM | POA: Diagnosis not present

## 2016-08-23 MED ORDER — CYANOCOBALAMIN 1000 MCG/ML IJ SOLN
1000.0000 ug | Freq: Once | INTRAMUSCULAR | Status: AC
  Administered 2016-08-23: 1000 ug via INTRAMUSCULAR

## 2016-08-23 NOTE — Progress Notes (Signed)
Vitamin B12 given with no problems or incidence. Patient left with no complaints.

## 2016-09-22 ENCOUNTER — Ambulatory Visit: Payer: Medicare Other

## 2016-09-25 ENCOUNTER — Ambulatory Visit (INDEPENDENT_AMBULATORY_CARE_PROVIDER_SITE_OTHER): Payer: Medicare Other | Admitting: Family Medicine

## 2016-09-25 DIAGNOSIS — E538 Deficiency of other specified B group vitamins: Secondary | ICD-10-CM

## 2016-09-25 MED ORDER — CYANOCOBALAMIN 1000 MCG/ML IJ SOLN
1000.0000 ug | Freq: Once | INTRAMUSCULAR | Status: AC
  Administered 2016-09-25: 1000 ug via INTRAMUSCULAR

## 2016-09-25 NOTE — Progress Notes (Signed)
Pt with vitamin B12 deficiency. Agree with vit B12 1000 mcg IM in office today.  Signed:  Crissie Sickles, MD           09/25/2016

## 2016-11-08 ENCOUNTER — Ambulatory Visit (INDEPENDENT_AMBULATORY_CARE_PROVIDER_SITE_OTHER): Payer: Medicare Other | Admitting: Family Medicine

## 2016-11-08 DIAGNOSIS — E538 Deficiency of other specified B group vitamins: Secondary | ICD-10-CM | POA: Diagnosis not present

## 2016-11-08 MED ORDER — CYANOCOBALAMIN 1000 MCG/ML IJ SOLN
1000.0000 ug | Freq: Once | INTRAMUSCULAR | Status: AC
  Administered 2016-11-08: 1000 ug via INTRAMUSCULAR

## 2016-11-09 NOTE — Progress Notes (Signed)
Pt with vit B12 def. Agree with 1000 mcg vit B12 injection in office 11/08/16.  Signed:  Crissie Sickles, MD           11/09/2016

## 2016-12-11 ENCOUNTER — Ambulatory Visit (INDEPENDENT_AMBULATORY_CARE_PROVIDER_SITE_OTHER): Payer: Medicare Other | Admitting: Family Medicine

## 2016-12-11 DIAGNOSIS — E538 Deficiency of other specified B group vitamins: Secondary | ICD-10-CM | POA: Diagnosis not present

## 2016-12-11 MED ORDER — CYANOCOBALAMIN 1000 MCG/ML IJ SOLN
1000.0000 ug | Freq: Once | INTRAMUSCULAR | Status: AC
  Administered 2016-12-11: 1000 ug via INTRAMUSCULAR

## 2016-12-11 NOTE — Progress Notes (Signed)
Pt with dx of vitamin B12 deficiency. Agree with vit B12 1000 mcg IM in office today.  Signed:  Crissie Sickles, MD           12/11/2016

## 2017-01-11 ENCOUNTER — Ambulatory Visit (INDEPENDENT_AMBULATORY_CARE_PROVIDER_SITE_OTHER): Payer: Medicare Other | Admitting: Family Medicine

## 2017-01-11 DIAGNOSIS — E538 Deficiency of other specified B group vitamins: Secondary | ICD-10-CM

## 2017-01-11 MED ORDER — CYANOCOBALAMIN 1000 MCG/ML IJ SOLN
1000.0000 ug | Freq: Once | INTRAMUSCULAR | Status: AC
  Administered 2017-01-11: 1000 ug via INTRAMUSCULAR

## 2017-01-11 NOTE — Progress Notes (Signed)
Pt with dx of vit B12 deficiency. Agree with injection of vit B12 today.  Signed:  Crissie Sickles, MD           01/11/2017

## 2017-02-08 ENCOUNTER — Encounter (INDEPENDENT_AMBULATORY_CARE_PROVIDER_SITE_OTHER): Payer: Self-pay | Admitting: *Deleted

## 2017-02-09 DIAGNOSIS — Z8639 Personal history of other endocrine, nutritional and metabolic disease: Secondary | ICD-10-CM

## 2017-02-09 HISTORY — DX: Personal history of other endocrine, nutritional and metabolic disease: Z86.39

## 2017-02-12 ENCOUNTER — Ambulatory Visit (INDEPENDENT_AMBULATORY_CARE_PROVIDER_SITE_OTHER): Payer: Medicare Other

## 2017-02-12 DIAGNOSIS — E538 Deficiency of other specified B group vitamins: Secondary | ICD-10-CM | POA: Diagnosis not present

## 2017-02-12 MED ORDER — CYANOCOBALAMIN 1000 MCG/ML IJ SOLN
1000.0000 ug | Freq: Once | INTRAMUSCULAR | Status: AC
  Administered 2017-02-12: 1000 ug via INTRAMUSCULAR

## 2017-02-12 NOTE — Progress Notes (Signed)
Patient presents today for Vitamin B12 injection. Given with no problems or incidence. Patient left with no complaints. 

## 2017-02-27 ENCOUNTER — Ambulatory Visit (INDEPENDENT_AMBULATORY_CARE_PROVIDER_SITE_OTHER): Payer: Medicare Other | Admitting: Family Medicine

## 2017-02-27 ENCOUNTER — Encounter: Payer: Self-pay | Admitting: Family Medicine

## 2017-02-27 VITALS — BP 109/65 | HR 46 | Temp 97.6°F | Resp 16 | Ht 69.0 in | Wt 144.0 lb

## 2017-02-27 DIAGNOSIS — R42 Dizziness and giddiness: Secondary | ICD-10-CM

## 2017-02-27 DIAGNOSIS — E538 Deficiency of other specified B group vitamins: Secondary | ICD-10-CM

## 2017-02-27 DIAGNOSIS — R5383 Other fatigue: Secondary | ICD-10-CM | POA: Diagnosis not present

## 2017-02-27 DIAGNOSIS — R9431 Abnormal electrocardiogram [ECG] [EKG]: Secondary | ICD-10-CM

## 2017-02-27 LAB — COMPREHENSIVE METABOLIC PANEL
ALT: 7 U/L (ref 0–53)
AST: 11 U/L (ref 0–37)
Albumin: 4.2 g/dL (ref 3.5–5.2)
Alkaline Phosphatase: 73 U/L (ref 39–117)
BUN: 12 mg/dL (ref 6–23)
CO2: 30 mEq/L (ref 19–32)
Calcium: 10.1 mg/dL (ref 8.4–10.5)
Chloride: 104 mEq/L (ref 96–112)
Creatinine, Ser: 1.19 mg/dL (ref 0.40–1.50)
GFR: 63.53 mL/min (ref >60.00)
Glucose, Bld: 92 mg/dL (ref 70–99)
Potassium: 5.3 mEq/L — ABNORMAL HIGH (ref 3.5–5.1)
Sodium: 138 mEq/L (ref 135–145)
Total Bilirubin: 0.6 mg/dL (ref 0.2–1.2)
Total Protein: 6.6 g/dL (ref 6.0–8.3)

## 2017-02-27 LAB — CBC WITH DIFFERENTIAL/PLATELET
Basophils Absolute: 0.2 10*3/uL — ABNORMAL HIGH (ref 0.0–0.1)
Basophils Relative: 3.1 % — ABNORMAL HIGH (ref 0.0–3.0)
Eosinophils Absolute: 0.3 10*3/uL (ref 0.0–0.7)
Eosinophils Relative: 3.5 % (ref 0.0–5.0)
HCT: 43.3 % (ref 39.0–52.0)
Hemoglobin: 14.5 g/dL (ref 13.0–17.0)
Lymphocytes Relative: 20.6 % (ref 12.0–46.0)
Lymphs Abs: 1.5 10*3/uL (ref 0.7–4.0)
MCHC: 33.4 g/dL (ref 30.0–36.0)
MCV: 87.2 fl (ref 78.0–100.0)
Monocytes Absolute: 0.7 10*3/uL (ref 0.1–1.0)
Monocytes Relative: 9.6 % (ref 3.0–12.0)
Neutro Abs: 4.7 10*3/uL (ref 1.4–7.7)
Neutrophils Relative %: 63.2 % (ref 43.0–77.0)
Platelets: 248 10*3/uL (ref 150.0–400.0)
RBC: 4.97 Mil/uL (ref 4.22–5.81)
RDW: 14.8 % (ref 11.5–15.5)
WBC: 7.5 10*3/uL (ref 4.0–10.5)

## 2017-02-27 MED ORDER — DOXYCYCLINE HYCLATE 100 MG PO CAPS: 100.0000 mg | ORAL_CAPSULE | Freq: Two times a day (BID) | ORAL | 0 refills | Status: AC

## 2017-02-27 NOTE — Patient Instructions (Signed)
Drink more water OR gatorade throughout every day, and cut your coffee intake in half over the next 2 weeks. Drink no more than 1 cola drink per day.

## 2017-02-27 NOTE — Progress Notes (Signed)
OFFICE VISIT  02/27/2017   CC:  Chief Complaint  Patient presents with  . Dizziness  . Fatigue   HPI:    Patient is a 76 y.o. Caucasian male active smoker with COPD who presents for fatigue and dizziness. Last couple days feeling very tired, two days ago he felt lightheaded for about 2 min.  It went away spontaneously. He had not recently stood up from sitting position.  No palpitations or heart racing during this tiime. No CP, no SOB, no diaphoresis, no nausea.  No orthopnea or PND.  No LE swelling. Has hx of some tick bites lately, pulled two off yesterday and he doesn't know how long they were attached. Before yesterday he had several as well.  Feels better today.  Hx of poor sleep: gets up a lot, smokes, drinks coffee--this has been the way he has been chronically/habitually.  Has been in heat a lot lately, drinking lots of coffee, colas and NOT a good water drinker.   ROS: no melena or hematochezia.  No HAs, no myalgias or arthralgias, no unexplained fevers. Wt has been stable.  Past Medical History:  Diagnosis Date  . Adenomatous colon polyp    Dr. Laural Golden (28 polyps on 1st endo, 3 on 2nd endo a year later.  8 on colonoscopy 02/26/14--recall 3 yrs.  . Aortic insufficiency    echo 03/25/12-EF>55%, mild-mod Aortic regurg, sclerotic aortic valve,   . BPH (benign prostatic hyperplasia)    Dr. Lum Babe (pt is asymptomatic)  . Carotid artery occlusion    carotid dopplers 02/06/12-patent left carotid endarterectomy; right internal carotid 40-59%  . Chronic renal insufficiency, stage II (mild)    Borderline stage II/III--CrCl @ 62.  No RAS on 2003 angiogram (done for + renal artery dopplers--deemed eventually to have been a false positive).  Dr. Gwenlyn Found.  . DDD (degenerative disc disease), lumbar 02/2013   with spondylosis and moderate spinal stenosis+ left L5 and S1 nerve root impingement  . Ectatic abdominal aorta ([facility]) 08/08/2016   2.8 cm at widest point: repeat aortic u/s 08/2021.   Marland Kitchen Essential tremor   . Fatty liver 2006   Noted on abd u/s and renal u/s 2006/2007  . Hyperlipidemia   . Hypertension   . Peripheral neuropathic pain 2016   and paresthesias (feet); suspected due to B12 deficiency  . Plantar wart of left foot 12/21/2012  . SOB (shortness of breath)    MET test 03/25/12-mildly abnormal  . Solitary pulmonary nodule 03/14/2013   70m RUL 03/08/13--stable on repeat 07/2013, 03/2015, and 01/2016.  No additional imaging is required.  . Tobacco dependence    ongoing as of 02/2016  . Vitamin B12 deficiency without anemia 2016   Intrinsic factor NEG: home vit B12 injections started 11/17/14    Past Surgical History:  Procedure Laterality Date  . APPENDECTOMY  remote  . Arm surgery  July 2010   Left arm cyst/Lipoma  . carotid dopplers  02/2015; 02/2016   2017:  R ICA 1-39%, L ICA 40-59%: no change from 2016.  .Marland KitchenCAROTID ENDARTERECTOMY  11/25/08   Left     ICA  . COLONOSCOPY N/A 02/26/2014   Recall 3 yrs: (+ polypectomy--tubular adenoma) Procedure: COLONOSCOPY;  Surgeon: NRogene Houston MD;  [facility]: [facility];  Service: Endoscopy;  Laterality: N/A;  830  . TONSILLECTOMY  remote  . TRANSTHORACIC ECHOCARDIOGRAM  03/2012   EF 55%, normal LV syst fxn, impaired diast relaxation, mild/mod aortic regurg  . UMBILICAL HERNIA REPAIR  Outpatient Medications Prior to Visit  Medication Sig Dispense Refill  . aspirin 81 MG tablet Take 81 mg by mouth daily.    . atorvastatin (LIPITOR) 80 MG tablet Take 1 tablet (80 mg total) by mouth daily. 90 tablet 3  . gabapentin (NEURONTIN) 300 MG capsule 1 cap po qAM, 1 cap po q afternoon, and 2 caps po qhs 120 capsule 6  . losartan (COZAAR) 100 MG tablet Take 1 tablet (100 mg total) by mouth daily. 90 tablet 3  . omeprazole (PRILOSEC) 20 MG capsule Take 20 mg by mouth daily as needed (heartburn).     . primidone (MYSOLINE) 50 MG tablet TAKE 1 TABLET BY MOUTH TWICE DAILY 180 tablet 1   Facility-Administered Medications Prior to  Visit  Medication Dose Route Frequency Provider Last Rate Last Dose  . cyanocobalamin ((VITAMIN B-12)) injection 1,000 mcg  1,000 mcg Intramuscular Once McGowen, Philip H, MD        Allergies  Allergen Reactions  . Clonidine Derivatives Rash    ROS As per HPI  PE: Blood pressure 109/65, pulse (!) 46, temperature 97.6 F (36.4 C), temperature source Oral, resp. rate 16, height 5' 9" (1.753 m), weight 144 lb (65.3 kg), SpO2 100 %. Body mass index is 21.27 kg/m.  Gen: Alert, well appearing.  Patient is oriented to person, place, time, and situation. AFFECT: pleasant, lucid thought and speech. CV: Regular rhythm, bradycardic, 1-2/6 systolic murmur heard best at RUSB, S1 and S2 clear, no diastolic murmur, no rub or gallop. Chest is clear, no wheezing or rales. Normal symmetric air entry throughout both lung fields. No chest wall deformities or tenderness. EXT: no clubbing, cyanosis, or edema.   LABS:  12 lead EKG today: sinus brady, rate 47/min, J point elevation, TWI in aVL, low voltage in aVL, normal duration and intervals--all unchanged from prior EKG 04/01/13. New since 10/2012 EKG are biphasic T waves in V3 and V4  Lab Results  Component Value Date   VITAMINB12 250 11/12/2014    Lab Results  Component Value Date   TSH 2.28 11/12/2014   Lab Results  Component Value Date   WBC 8.5 11/12/2014   HGB 14.9 11/12/2014   HCT 43.1 11/12/2014   MCV 85.8 11/12/2014   PLT 225.0 11/12/2014   Lab Results  Component Value Date   CREATININE 1.10 01/04/2016   BUN 9 01/04/2016   NA 138 01/04/2016   K 4.6 01/04/2016   CL 101 01/04/2016   CO2 31 01/04/2016   Lab Results  Component Value Date   ALT 9 01/04/2016   AST 12 01/04/2016   ALKPHOS 100 01/04/2016   BILITOT 0.7 01/04/2016   Lab Results  Component Value Date   CHOL 142 02/22/2016   Lab Results  Component Value Date   HDL 37.90 (L) 02/22/2016   Lab Results  Component Value Date   LDLCALC 87 02/22/2016   Lab  Results  Component Value Date   TRIG 84.0 02/22/2016   Lab Results  Component Value Date   CHOLHDL 4 02/22/2016   Lab Results  Component Value Date   PSA 0.85 12/23/2012   Lab Results  Component Value Date   HGBA1C 5.8 (H) 11/12/2014   IMPRESSION AND PLAN:  1) Generalized fatigue with one episode of disequilibrium/dizziness--the last 2 days. Definitely needs to hydrate better, cut back on caffeinated drinks. Unsure if tick bites have anything to do with this, but not very likely.  However, will do empiric doxy 100 mg bid   x 7d. Labs: CBC w/diff, CMET.  2) Abnormal EKG today: subtle changes that could suggest anterior ischemia.  Pt asymptomatic currently. He is on an ASA qd.  Will abnormal EKG, RF's of age, tobacco use, HTN, and hyperlipidemia, I definitely want to further evaluate this with myocardial perfusion imaging. Lexiscan myoview ordered.  An After Visit Summary was printed and given to the patient.  FOLLOW UP: Return f/u to be determined based on results of w/u.  Signed:  Phil McGowen, MD           02/27/2017     

## 2017-02-28 ENCOUNTER — Telehealth (INDEPENDENT_AMBULATORY_CARE_PROVIDER_SITE_OTHER): Payer: Self-pay | Admitting: *Deleted

## 2017-02-28 ENCOUNTER — Other Ambulatory Visit: Payer: Self-pay | Admitting: *Deleted

## 2017-02-28 DIAGNOSIS — E875 Hyperkalemia: Secondary | ICD-10-CM

## 2017-02-28 NOTE — Telephone Encounter (Signed)
Lisa from [facility] called in reference to getting his TCS scheduled.  She states he is over due and if you would call them at [phone] ask for Lattie Haw or Diane to get this scheduled.  I think [place] spoke to them also   They are aware you are out of town

## 2017-03-01 ENCOUNTER — Ambulatory Visit (HOSPITAL_COMMUNITY): Payer: Medicare Other

## 2017-03-01 ENCOUNTER — Encounter: Payer: Self-pay | Admitting: Family

## 2017-03-07 NOTE — Telephone Encounter (Signed)
Lm for patient to give me a call to schedule 3 yr TCS

## 2017-03-08 ENCOUNTER — Telehealth (HOSPITAL_COMMUNITY): Payer: Self-pay | Admitting: *Deleted

## 2017-03-08 NOTE — Telephone Encounter (Signed)
Left message on voicemail per DPR in reference to upcoming appointment scheduled on 03/11/17 with detailed instructions given per Myocardial Perfusion Study Information Sheet for the test. LM to arrive 15 minutes early, and that it is imperative to arrive on time for appointment to keep from having the test rescheduled. If you need to cancel or reschedule your appointment, please call the office within 24 hours of your appointment. Failure to do so may result in a cancellation of your appointment, and a $50 no show fee. Phone number given for call back for any questions. Kirstie Peri

## 2017-03-11 HISTORY — PX: CARDIOVASCULAR STRESS TEST: SHX262

## 2017-03-12 ENCOUNTER — Other Ambulatory Visit (INDEPENDENT_AMBULATORY_CARE_PROVIDER_SITE_OTHER): Payer: Self-pay | Admitting: *Deleted

## 2017-03-12 ENCOUNTER — Ambulatory Visit: Payer: Medicare Other | Admitting: Family Medicine

## 2017-03-12 ENCOUNTER — Other Ambulatory Visit (INDEPENDENT_AMBULATORY_CARE_PROVIDER_SITE_OTHER): Payer: Medicare Other

## 2017-03-12 ENCOUNTER — Encounter: Payer: Self-pay | Admitting: Family

## 2017-03-12 DIAGNOSIS — Z86010 Personal history of colonic polyps: Secondary | ICD-10-CM

## 2017-03-12 DIAGNOSIS — E538 Deficiency of other specified B group vitamins: Secondary | ICD-10-CM

## 2017-03-12 MED ORDER — CYANOCOBALAMIN 1000 MCG/ML IJ SOLN
1000.0000 ug | Freq: Once | INTRAMUSCULAR | Status: AC
  Administered 2017-03-12: 1000 ug via INTRAMUSCULAR

## 2017-03-12 NOTE — Progress Notes (Unsigned)
Patient presented to office for B12 injection.   Injection administered Right deltoid IM and tolerated well.

## 2017-03-12 NOTE — Progress Notes (Signed)
Pt presented to office today for B12 injection.  Injection tolerated well, given Right deltoid IM.

## 2017-03-13 ENCOUNTER — Ambulatory Visit (HOSPITAL_COMMUNITY): Payer: Medicare Other | Attending: Cardiovascular Disease

## 2017-03-13 ENCOUNTER — Other Ambulatory Visit: Payer: Self-pay | Admitting: *Deleted

## 2017-03-13 VITALS — Ht 69.0 in | Wt 144.0 lb

## 2017-03-13 DIAGNOSIS — R5383 Other fatigue: Secondary | ICD-10-CM

## 2017-03-13 DIAGNOSIS — I779 Disorder of arteries and arterioles, unspecified: Secondary | ICD-10-CM | POA: Diagnosis not present

## 2017-03-13 DIAGNOSIS — R9431 Abnormal electrocardiogram [ECG] [EKG]: Secondary | ICD-10-CM

## 2017-03-13 DIAGNOSIS — I2510 Atherosclerotic heart disease of native coronary artery without angina pectoris: Secondary | ICD-10-CM | POA: Diagnosis present

## 2017-03-13 DIAGNOSIS — I10 Essential (primary) hypertension: Secondary | ICD-10-CM | POA: Insufficient documentation

## 2017-03-13 DIAGNOSIS — I6523 Occlusion and stenosis of bilateral carotid arteries: Secondary | ICD-10-CM

## 2017-03-13 DIAGNOSIS — R9439 Abnormal result of other cardiovascular function study: Secondary | ICD-10-CM | POA: Diagnosis not present

## 2017-03-13 DIAGNOSIS — R42 Dizziness and giddiness: Secondary | ICD-10-CM

## 2017-03-13 LAB — MYOCARDIAL PERFUSION IMAGING
CHL CUP NUCLEAR SDS: 4
CHL CUP NUCLEAR SRS: 5
CHL CUP RESTING HR STRESS: 43 {beats}/min
CSEPPHR: 93 {beats}/min
LHR: 0.22
LV dias vol: 99 mL (ref 62–150)
LVSYSVOL: 4 mL
NUC STRESS TID: 0.99
SSS: 9

## 2017-03-13 MED ORDER — TECHNETIUM TC 99M TETROFOSMIN IV KIT
32.6000 | PACK | Freq: Once | INTRAVENOUS | Status: AC | PRN
  Administered 2017-03-13: 32.6 via INTRAVENOUS
  Filled 2017-03-13: qty 33

## 2017-03-13 MED ORDER — REGADENOSON 0.4 MG/5ML IV SOLN
0.4000 mg | Freq: Once | INTRAVENOUS | Status: AC
  Administered 2017-03-13: 0.4 mg via INTRAVENOUS

## 2017-03-13 MED ORDER — TECHNETIUM TC 99M TETROFOSMIN IV KIT
10.4000 | PACK | Freq: Once | INTRAVENOUS | Status: AC | PRN
  Administered 2017-03-13: 10.4 via INTRAVENOUS
  Filled 2017-03-13: qty 11

## 2017-03-14 NOTE — Progress Notes (Signed)
  Cardiology Office Note   Date:  03/15/2017   ID:  Jeffrey [name], DOB 07/25/1941, MRN [id number]  PCP:  McGowen, Philip H, MD  Cardiologist:   James Hochrein, MD  Referring:  McGowen, Philip H, MD  Chief Complaint  Patient presents with  . Abnormal Stress Test      History of Present Illness: Jeffrey [name] is a 75 y.o. male who presents for evaluation of an abnormal Lexiscan Myoview.  This was ordered by McGowen, Philip H, MD and demonstrated a normal EF with a large fixed defect in the inferoseptum.  This was thought to be a low risk study.  This study was ordered to follow up an abnormal EKG.   The EKG demonstrated poor anterior R wave progression and anterior T wave inversion.   He has no past cardiac history although he does have a history of carotid stenosis. He does smoke cigarettes. He has high blood pressures been well treated recently. He is active. He does yard work. He does walking. He trims with a lawnmower. With all of this he denies any cardiovascular symptoms. The patient denies any new symptoms such as chest discomfort, neck or arm discomfort. There has been no new shortness of breath, PND or orthopnea. There have been no reported palpitations or syncope.  He did have one episode of lightheadedness recently that prompted his evaluation but he says this kind of dizziness and wobbliness has not happened again.  Past Medical History:  Diagnosis Date  . Adenomatous colon polyp    Dr. [name] (28 polyps on 1st endo, 3 on 2nd endo a year later.  8 on colonoscopy 02/26/14--recall 3 yrs.  . Aortic insufficiency    echo 03/25/12-EF>55%, mild-mod Aortic regurg, sclerotic aortic valve,   . BPH (benign prostatic hyperplasia)    Dr. Jaived (pt is asymptomatic)  . Carotid artery occlusion    carotid dopplers 02/06/12-patent left carotid endarterectomy; right internal carotid 40-59%  . Chronic renal insufficiency, stage II (mild)    Borderline stage II/III--CrCl @ 62.  No RAS on 2003  angiogram (done for + renal artery dopplers--deemed eventually to have been a false positive).  Dr. Berry.  . DDD (degenerative disc disease), lumbar 02/2013   with spondylosis and moderate spinal stenosis+ left L5 and S1 nerve root impingement  . Ectatic abdominal aorta ([facility]) 08/08/2016   2.8 cm at widest point: repeat aortic u/s 08/2021.  . Essential tremor   . Fatty liver 2006   Noted on abd u/s and renal u/s 2006/2007  . History of hyperkalemia 02/2017   mild; recommended pt cut his ARB in half.  . Hyperlipidemia   . Hypertension   . Plantar wart of left foot 12/21/2012  . SOB (shortness of breath)    MET test 03/25/12-mildly abnormal  . Solitary pulmonary nodule 03/14/2013   7mm RUL 03/08/13--stable on repeat 07/2013, 03/2015, and 01/2016.  No additional imaging is required.  . Tobacco dependence    ongoing as of 02/2016  . Vitamin B12 deficiency without anemia 2016   Intrinsic factor NEG: home vit B12 injections started 11/17/14    Past Surgical History:  Procedure Laterality Date  . APPENDECTOMY  remote  . Arm surgery  July 2010   Left arm cyst/Lipoma  . carotid dopplers  02/2015; 02/2016   2017:  R ICA 1-39%, L ICA 40-59%: no change from 2016.  . CAROTID ENDARTERECTOMY  11/25/08   Left     ICA  . COLONOSCOPY N/A 02/26/2014     Recall 3 yrs: (+ polypectomy--tubular adenoma) Procedure: COLONOSCOPY;  Surgeon: Rogene Houston, MD;  Location: [facility];  Service: Endoscopy;  Laterality: N/A;  830  . TONSILLECTOMY  remote  . TRANSTHORACIC ECHOCARDIOGRAM  03/2012   EF 55%, normal LV syst fxn, impaired diast relaxation, mild/mod aortic regurg  . UMBILICAL HERNIA REPAIR       Current Outpatient Prescriptions  Medication Sig Dispense Refill  . aspirin 81 MG tablet Take 81 mg by mouth daily.    Marland Kitchen doxycycline (VIBRA-TABS) 100 MG tablet Take 100 mg by mouth 2 (two) times daily. For 14 days    . gabapentin (NEURONTIN) 300 MG capsule 1 cap po qAM, 1 cap po q afternoon, and 2 caps po qhs 120  capsule 6  . losartan (COZAAR) 100 MG tablet Take 50 mg by mouth daily.    . metoprolol tartrate (LOPRESSOR) 25 MG tablet Take 25 mg by mouth 2 (two) times daily.    Marland Kitchen atorvastatin (LIPITOR) 10 MG tablet Take 1 tablet (10 mg total) by mouth daily. 90 tablet 3  . varenicline (CHANTIX STARTING MONTH PAK) 0.5 MG X 11 & 1 MG X 42 tablet Take one 0.5 mg tablet by mouth once daily for 3 days, then increase to one 0.5 mg tablet twice daily for 4 days, then increase to one 1 mg tablet twice daily. 53 tablet 0   Current Facility-Administered Medications  Medication Dose Route Frequency Provider Last Rate Last Dose  . cyanocobalamin ((VITAMIN B-12)) injection 1,000 mcg  1,000 mcg Intramuscular Once McGowen, Adrian Blackwater, MD        Allergies:   Clonidine derivatives    Social History:  The patient  reports that he has been smoking Cigarettes.  He has a 27.50 pack-year smoking history. He has never used smokeless tobacco. He reports that he does not drink alcohol or use drugs.   Family History:  The patient's family history includes Coronary artery disease in his mother; Diabetes in his mother; Hypertension in his mother; Parkinson's disease in his mother; Stroke in his mother.    ROS:  Please see the history of present illness.   Otherwise, review of systems are positive for none.   All other systems are reviewed and negative.    PHYSICAL EXAM: VS:  BP 124/62   Pulse (!) 43   Ht 5' 9" (1.753 m)   Wt 146 lb (66.2 kg)   BMI 21.56 kg/m  , BMI Body mass index is 21.56 kg/m. GENERAL:  Well appearing HEENT:  Pupils equal round and reactive, fundi not visualized, oral mucosa unremarkable NECK:  No jugular venous distention, waveform within normal limits, carotid upstroke brisk and symmetric, no bruits, no thyromegaly LYMPHATICS:  No cervical, inguinal adenopathy LUNGS:  Clear to auscultation bilaterally BACK:  No CVA tenderness CHEST:  Unremarkable HEART:  PMI not displaced or sustained,S1 and S2  within normal limits, no S3, no S4, no clicks, no rubs, nonradiating, no diastolic murmurs ABD:  Flat, positive bowel sounds normal in frequency in pitch, no bruits, no rebound, no guarding, no midline pulsatile mass, no hepatomegaly, no splenomegaly EXT:  2 plus pulses throughout, no edema, no cyanosis no clubbing SKIN:  No rashes no nodules NEURO:  Cranial nerves II through XII grossly intact, motor grossly intact throughout PSYCH:  Cognitively intact, oriented to person place and time    EKG:  EKG is ordered today. The ekg ordered today demonstrates sinus bradycardia, axis within normal limits, intervals within normal limits, anterior  T-wave changes suggestive of possible ischemia but also possible repolarization changes.     Recent Labs: 02/27/2017: ALT 7; BUN 12; Creatinine, Ser 1.19; Hemoglobin 14.5; Platelets 248.0; Potassium 5.3; Sodium 138    Lipid Panel    Component Value Date/Time   CHOL 142 02/22/2016 1008   TRIG 84.0 02/22/2016 1008   HDL 37.90 (L) 02/22/2016 1008   CHOLHDL 4 02/22/2016 1008   VLDL 16.8 02/22/2016 1008   LDLCALC 87 02/22/2016 1008      Wt Readings from Last 3 Encounters:  03/15/17 146 lb (66.2 kg)  03/13/17 144 lb (65.3 kg)  02/27/17 144 lb (65.3 kg)      Other studies Reviewed: Additional studies/ records that were reviewed today include: Lexiscan Myoview.. Review of the above records demonstrates:  Please see elsewhere in the note.     ASSESSMENT AND PLAN:    ABNORMAL PERFUSION STUDY:   The patient may well add an old inferior infarct but he otherwise has a low risk study. He has no active symptoms. This can be managed with risk reduction and no further testing is indicated.  AI:  This was mild on AI in 2013.    I don't suspect this has progressed clinically and we can follow this with routine physical exams.  TOBACCO ABUSE:   We discussed a specific strategy for tobacco cessation.  (Greater than 10 minutes discussing tobacco cessation.)   He does want to try Chantix.  We discussed all of the potential side effects and in particular I reviewed the previous Black Box Warning.  He has no depression.  He understands and wishes to try this medication.  CAROTID ARTERY STENOSIS:  He is to follow tomorrow with [facility].  DYSLIPIDEMIA:  Given his peripheral vascular disease he should be on a statin and I will prescribe Lipitor 10 mg daily and he'll be given written instructions for a fasting lipid profile and liver enzymes   Current medicines are reviewed at length with the patient today.  The patient does not have concerns regarding medicines.  The following changes have been made:  As above  Labs/ tests ordered today include:   Orders Placed This Encounter  Procedures  . Lipid panel  . Hepatic function panel  . EKG 12-Lead     Disposition:   FU with me as needed.      Signed, James Hochrein, MD  03/15/2017 9:29 PM    [facility]    

## 2017-03-15 ENCOUNTER — Ambulatory Visit (INDEPENDENT_AMBULATORY_CARE_PROVIDER_SITE_OTHER): Payer: Medicare Other | Admitting: Cardiology

## 2017-03-15 ENCOUNTER — Encounter: Payer: Self-pay | Admitting: Cardiology

## 2017-03-15 VITALS — BP 124/62 | HR 43 | Ht 69.0 in | Wt 146.0 lb

## 2017-03-15 DIAGNOSIS — E785 Hyperlipidemia, unspecified: Secondary | ICD-10-CM

## 2017-03-15 DIAGNOSIS — R9439 Abnormal result of other cardiovascular function study: Secondary | ICD-10-CM

## 2017-03-15 DIAGNOSIS — Z79899 Other long term (current) drug therapy: Secondary | ICD-10-CM | POA: Diagnosis not present

## 2017-03-15 MED ORDER — ATORVASTATIN CALCIUM 10 MG PO TABS: 10.0000 mg | ORAL_TABLET | Freq: Every day | ORAL | 3 refills | Status: DC

## 2017-03-15 MED ORDER — VARENICLINE TARTRATE 0.5 MG X 11 & 1 MG X 42 PO MISC: ORAL | 0 refills | Status: DC

## 2017-03-15 NOTE — Patient Instructions (Addendum)
Medication Instructions:  START Lipitor 10mg  once daily.   Start Chantix as directed on prescription.   Labwork: FASTING blood work in 8 weeks (lipid panel, liver function)   Testing/Procedures: NONE  Follow-Up: AS NEEDED  Any Other Special Instructions Will Be Listed Below (If Applicable).     If you need a refill on your cardiac medications before your next appointment, please call your pharmacy.

## 2017-03-16 ENCOUNTER — Encounter: Payer: Self-pay | Admitting: Family

## 2017-03-16 ENCOUNTER — Ambulatory Visit (HOSPITAL_COMMUNITY)
Admission: RE | Admit: 2017-03-16 | Discharge: 2017-03-16 | Disposition: A | Discharge status: Home / Self Care | Payer: Medicare Other | Source: Ambulatory Visit | Attending: Vascular Surgery | Admitting: Vascular Surgery

## 2017-03-16 ENCOUNTER — Ambulatory Visit (INDEPENDENT_AMBULATORY_CARE_PROVIDER_SITE_OTHER): Payer: Medicare Other | Admitting: Family

## 2017-03-16 VITALS — BP 120/61 | HR 51 | Temp 97.7°F | Resp 16 | Ht 67.5 in | Wt 144.0 lb

## 2017-03-16 DIAGNOSIS — Z9889 Other specified postprocedural states: Secondary | ICD-10-CM | POA: Diagnosis not present

## 2017-03-16 DIAGNOSIS — I6523 Occlusion and stenosis of bilateral carotid arteries: Secondary | ICD-10-CM | POA: Diagnosis not present

## 2017-03-16 DIAGNOSIS — F17200 Nicotine dependence, unspecified, uncomplicated: Secondary | ICD-10-CM

## 2017-03-16 LAB — VAS US CAROTID
LCCADDIAS: 17 cm/s
LCCADSYS: 148 cm/s
LCCAPDIAS: 20 cm/s
LCCAPSYS: 124 cm/s
Left ICA dist dias: -22 cm/s
Left ICA dist sys: -82 cm/s
RCCADSYS: -108 cm/s
RIGHT CCA MID DIAS: 15 cm/s
RIGHT ECA DIAS: -9 cm/s
RIGHT VERTEBRAL DIAS: -10 cm/s
Right CCA prox dias: 12 cm/s
Right CCA prox sys: 87 cm/s

## 2017-03-16 NOTE — Progress Notes (Signed)
Chief Complaint: Follow up Extracranial Carotid Artery Stenosis   History of Present Illness  Jeffrey [name] is a 76 y.o. male patient of Dr. Kellie Simmering with a history of a left CEA in April 2010. He returns today for routine surveillance.  He denies any history of TIA or stroke symptom.Specifically he denies a history of amaurosis fugax or monocular blindness, unilateral facial drooping, hemiparesis/hemiplegia, or receptive or expressive aphasia.   He reports that he has had his kidney arteries checked due to uncontrolled hypertension, states this was not the problem; his antihypertensive medications are chronically adjusted.   He denies claudication symptoms in legs with walking, denies non healing wounds. 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  Pt Diabetic: No Pt smoker: smoker (2/3 ppd, started smoking at about age 89 yrs)  Pt meds include: Statin : Yes ASA: 81 mg ASA  Other anticoagulants/antiplatelets: no    Past Medical History:  Diagnosis Date  . Adenomatous colon polyp    Dr. Laural Golden (28 polyps on 1st endo, 3 on 2nd endo a year later.  8 on colonoscopy 02/26/14--recall 3 yrs.  . Aortic insufficiency    echo 03/25/12-EF>55%, mild-mod Aortic regurg, sclerotic aortic valve,   . BPH (benign prostatic hyperplasia)    Dr. Lum Babe (pt is asymptomatic)  . Carotid artery occlusion    carotid dopplers 02/06/12-patent left carotid endarterectomy; right internal carotid 40-59%  . Chronic renal insufficiency, stage II (mild)    Borderline stage II/III--CrCl @ 62.  No RAS on 2003 angiogram (done for + renal artery dopplers--deemed eventually to have been a false positive).  Dr. Gwenlyn Found.  . DDD (degenerative disc disease), lumbar 02/2013   with spondylosis and moderate spinal stenosis+ left L5 and S1 nerve root impingement  . Ectatic abdominal aorta ([place]) 08/08/2016   2.8 cm at widest point: repeat aortic u/s 08/2021.   Marland Kitchen Essential tremor   . Fatty liver 2006   Noted on abd u/s and renal u/s 2006/2007  . History of hyperkalemia 02/2017   mild; recommended pt cut his ARB in half.  . Hyperlipidemia   . Hypertension   . Plantar wart of left foot 12/21/2012  . SOB (shortness of breath)    MET test 03/25/12-mildly abnormal  . Solitary pulmonary nodule 03/14/2013   100m RUL 03/08/13--stable on repeat 07/2013, 03/2015, and 01/2016.  No additional imaging is required.  . Tobacco dependence    ongoing as of 02/2016  . Vitamin B12 deficiency without anemia 2016   Intrinsic factor NEG: home vit B12 injections started 11/17/14    Social History Social History  Substance Use Topics  . Smoking status: Current Every Day Smoker    Packs/day: 0.50    Years: 55.00    Types: Cigarettes  . Smokeless tobacco: Never Used  . Alcohol use No    Family History Family History  Problem Relation Age of Onset  . Coronary artery disease Mother   . Stroke Mother   . Diabetes Mother   . Hypertension Mother   . Parkinson's disease Mother   . Colon cancer Neg Hx     Surgical History Past Surgical History:  Procedure Laterality Date  . APPENDECTOMY  remote  . Arm surgery  July 2010   Left arm cyst/Lipoma  . carotid dopplers  02/2015; 02/2016   2017:  R ICA 1-39%, L ICA 40-59%: no change from 2016.  .Marland KitchenCAROTID ENDARTERECTOMY  11/25/08  Left     ICA  . COLONOSCOPY N/A 02/26/2014   Recall 3 yrs: (+ polypectomy--tubular adenoma) Procedure: COLONOSCOPY;  Surgeon: Rogene Houston, MD;  Location: [facility];  Service: Endoscopy;  Laterality: N/A;  830  . TONSILLECTOMY  remote  . TRANSTHORACIC ECHOCARDIOGRAM  03/2012   EF 55%, normal LV syst fxn, impaired diast relaxation, mild/mod aortic regurg  . UMBILICAL HERNIA REPAIR      Allergies  Allergen Reactions  . Clonidine Derivatives Rash    Current Outpatient Prescriptions  Medication Sig Dispense Refill  . aspirin 81 MG tablet Take 81 mg by mouth daily.    Marland Kitchen  atorvastatin (LIPITOR) 10 MG tablet Take 1 tablet (10 mg total) by mouth daily. 90 tablet 3  . doxycycline (VIBRA-TABS) 100 MG tablet Take 100 mg by mouth 2 (two) times daily. For 14 days    . gabapentin (NEURONTIN) 300 MG capsule 1 cap po qAM, 1 cap po q afternoon, and 2 caps po qhs 120 capsule 6  . losartan (COZAAR) 100 MG tablet Take 50 mg by mouth daily.    . metoprolol tartrate (LOPRESSOR) 25 MG tablet Take 25 mg by mouth 2 (two) times daily.    . varenicline (CHANTIX STARTING MONTH PAK) 0.5 MG X 11 & 1 MG X 42 tablet Take one 0.5 mg tablet by mouth once daily for 3 days, then increase to one 0.5 mg tablet twice daily for 4 days, then increase to one 1 mg tablet twice daily. 53 tablet 0   Current Facility-Administered Medications  Medication Dose Route Frequency Provider Last Rate Last Dose  . cyanocobalamin ((VITAMIN B-12)) injection 1,000 mcg  1,000 mcg Intramuscular Once McGowen, Adrian Blackwater, MD        Review of Systems : See HPI for pertinent positives and negatives.  Physical Examination  Vitals:   03/16/17 1344 03/16/17 1345  BP: 114/62 120/61  Pulse: (!) 51   Resp: 16   Temp: 97.7 F (36.5 C)   SpO2: 100%   Weight: 144 lb (65.3 kg)   Height: 5' 7.5" (1.715 m)    Body mass index is 22.22 kg/m.  General: WDWN male in NAD GAIT: normal Eyes: PERRLA Pulmonary: Respirations are non-labored, CTAB Cardiac: Regular bradycardic rhythm,positive  murmur.  VASCULAR EXAM Carotid Bruits Left Right   Transmitted cardiac murmur Transmitted cardiac murmur   Aorta is not palpable. Radial pulses are 2+ palpable and equal.      LE Pulses LEFT RIGHT   POPLITEAL not palpable  not palpable   DORSALIS PEDIS  ANTERIOR TIBIAL 2+ palpable  2+ palpable     Gastrointestinal: soft, nontender, BS WNL, no  r/g,no palable masses.  Musculoskeletal: No muscle atrophy/wasting. M/S 5/5 throughout, Extremities without ischemic changes  Neurologic: A&O X 3; appropriate affect,speech is normal, CN 2-12 intact except some hearing loss, pain and light touch intact in extremities, motor exam as listed above.     Assessment: Jeffrey [name] is a 76 y.o. male who is s/p left CEA in April 2010. He has no history of stroke of TIA.  His atherosclerotic risk factors include current smoker x 60+ years.  Fortunately he does not have DM.   DATA Carotid Duplex (03/16/17): 40-59% right internal carotid artery stenosis, calcific plaque may obscure higher velocity. 40-59% left internal carotid artery stenosis, CEA site.  Bilateral vertebral artery flow is antegrade.  Bilateral subclavian artery waveforms are normal.  Increased stenosis of the right ICA since the last exam on 03-07-16.  Plan:  The patient was counseled re smoking cessation and given several free resources re smoking cessation.   Follow-up in 9 months with Carotid Duplex scan.   I discussed in depth with the patient the nature of atherosclerosis, and emphasized the importance of maximal medical management including strict control of blood pressure, blood glucose, and lipid levels, obtaining regular exercise, and cessation of smoking.  The patient is aware that without maximal medical management the underlying atherosclerotic disease process will progress, limiting the benefit of any interventions. The patient was given information about stroke prevention and what symptoms should prompt the patient to seek immediate medical care. Thank you for allowing [place] to participate in this patient's care.  Clemon Chambers, RN, MSN, FNP-C [facility] Office: [phone]  Clinic Physician: Donzetta Matters  03/16/17 1:52 PM

## 2017-03-16 NOTE — Patient Instructions (Signed)
Steps to Quit Smoking Smoking tobacco can be bad for your health. It can also affect almost every organ in your body. Smoking puts you and people around you at risk for many serious long-lasting (chronic) diseases. Quitting smoking is hard, but it is one of the best things that you can do for your health. It is never too late to quit. What are the benefits of quitting smoking? When you quit smoking, you lower your risk for getting serious diseases and conditions. They can include:  Lung cancer or lung disease.  Heart disease.  Stroke.  Heart attack.  Not being able to have children (infertility).  Weak bones (osteoporosis) and broken bones (fractures).  If you have coughing, wheezing, and shortness of breath, those symptoms may get better when you quit. You may also get sick less often. If you are pregnant, quitting smoking can help to lower your chances of having a baby of low birth weight. What can I do to help me quit smoking? Talk with your doctor about what can help you quit smoking. Some things you can do (strategies) include:  Quitting smoking totally, instead of slowly cutting back how much you smoke over a period of time.  Going to in-person counseling. You are more likely to quit if you go to many counseling sessions.  Using resources and support systems, such as: ? Online chats with a counselor. ? Phone quitlines. ? Printed self-help materials. ? Support groups or group counseling. ? Text messaging programs. ? Mobile phone apps or applications.  Taking medicines. Some of these medicines may have nicotine in them. If you are pregnant or breastfeeding, do not take any medicines to quit smoking unless your doctor says it is okay. Talk with your doctor about counseling or other things that can help you.  Talk with your doctor about using more than one strategy at the same time, such as taking medicines while you are also going to in-person counseling. This can help make  quitting easier. What things can I do to make it easier to quit? Quitting smoking might feel very hard at first, but there is a lot that you can do to make it easier. Take these steps:  Talk to your family and friends. Ask them to support and encourage you.  Call phone quitlines, reach out to support groups, or work with a counselor.  Ask people who smoke to not smoke around you.  Avoid places that make you want (trigger) to smoke, such as: ? Bars. ? Parties. ? Smoke-break areas at work.  Spend time with people who do not smoke.  Lower the stress in your life. Stress can make you want to smoke. Try these things to help your stress: ? Getting regular exercise. ? Deep-breathing exercises. ? Yoga. ? Meditating. ? Doing a body scan. To do this, close your eyes, focus on one area of your body at a time from head to toe, and notice which parts of your body are tense. Try to relax the muscles in those areas.  Download or buy apps on your mobile phone or tablet that can help you stick to your quit plan. There are many free apps, such as QuitGuide from the CDC (Centers for Disease Control and Prevention). You can find more support from smokefree.gov and other websites.  This information is not intended to replace advice given to you by your health care provider. Make sure you discuss any questions you have with your health care provider. Document Released: 06/24/2009 Document   Revised: 04/25/2016 Document Reviewed: 01/12/2015 Elsevier Interactive Patient Education  2018 Elsevier Inc.     Stroke Prevention Some medical conditions and behaviors are associated with an increased chance of having a stroke. You may prevent a stroke by making healthy choices and managing medical conditions. How can I reduce my risk of having a stroke?  Stay physically active. Get at least 30 minutes of activity on most or all days.  Do not smoke. It may also be helpful to avoid exposure to secondhand  smoke.  Limit alcohol use. Moderate alcohol use is considered to be: ? No more than 2 drinks per day for men. ? No more than 1 drink per day for nonpregnant women.  Eat healthy foods. This involves: ? Eating 5 or more servings of fruits and vegetables a day. ? Making dietary changes that address high blood pressure (hypertension), high cholesterol, diabetes, or obesity.  Manage your cholesterol levels. ? Making food choices that are high in fiber and low in saturated fat, trans fat, and cholesterol may control cholesterol levels. ? Take any prescribed medicines to control cholesterol as directed by your health care provider.  Manage your diabetes. ? Controlling your carbohydrate and sugar intake is recommended to manage diabetes. ? Take any prescribed medicines to control diabetes as directed by your health care provider.  Control your hypertension. ? Making food choices that are low in salt (sodium), saturated fat, trans fat, and cholesterol is recommended to manage hypertension. ? Ask your health care provider if you need treatment to lower your blood pressure. Take any prescribed medicines to control hypertension as directed by your health care provider. ? If you are 18-39 years of age, have your blood pressure checked every 3-5 years. If you are 40 years of age or older, have your blood pressure checked every year.  Maintain a healthy weight. ? Reducing calorie intake and making food choices that are low in sodium, saturated fat, trans fat, and cholesterol are recommended to manage weight.  Stop drug abuse.  Avoid taking birth control pills. ? Talk to your health care provider about the risks of taking birth control pills if you are over 35 years old, smoke, get migraines, or have ever had a blood clot.  Get evaluated for sleep disorders (sleep apnea). ? Talk to your health care provider about getting a sleep evaluation if you snore a lot or have excessive sleepiness.  Take  medicines only as directed by your health care provider. ? For some people, aspirin or blood thinners (anticoagulants) are helpful in reducing the risk of forming abnormal blood clots that can lead to stroke. If you have the irregular heart rhythm of atrial fibrillation, you should be on a blood thinner unless there is a good reason you cannot take them. ? Understand all your medicine instructions.  Make sure that other conditions (such as anemia or atherosclerosis) are addressed. Get help right away if:  You have sudden weakness or numbness of the face, arm, or leg, especially on one side of the body.  Your face or eyelid droops to one side.  You have sudden confusion.  You have trouble speaking (aphasia) or understanding.  You have sudden trouble seeing in one or both eyes.  You have sudden trouble walking.  You have dizziness.  You have a loss of balance or coordination.  You have a sudden, severe headache with no known cause.  You have new chest pain or an irregular heartbeat. Any of these symptoms may represent   a serious problem that is an emergency. Do not wait to see if the symptoms will go away. Get medical help at once. Call your local emergency services (911 in [place] Do not drive yourself to the hospital. This information is not intended to replace advice given to you by your health care provider. Make sure you discuss any questions you have with your health care provider. Document Released: 10/05/2004 Document Revised: 02/03/2016 Document Reviewed: 02/28/2013 Elsevier Interactive Patient Education  2017 Elsevier Inc.  

## 2017-03-18 ENCOUNTER — Encounter: Payer: Self-pay | Admitting: Family Medicine

## 2017-03-19 ENCOUNTER — Other Ambulatory Visit (INDEPENDENT_AMBULATORY_CARE_PROVIDER_SITE_OTHER): Payer: Medicare Other

## 2017-03-19 DIAGNOSIS — E875 Hyperkalemia: Secondary | ICD-10-CM | POA: Diagnosis not present

## 2017-03-19 LAB — BASIC METABOLIC PANEL
BUN: 11 mg/dL (ref 6–23)
CALCIUM: 9.8 mg/dL (ref 8.4–10.5)
CO2: 35 mEq/L — ABNORMAL HIGH (ref 19–32)
Chloride: 101 mEq/L (ref 96–112)
Creatinine, Ser: 1.34 mg/dL (ref 0.40–1.50)
GFR: 55.39 mL/min — AB (ref >60.00)
GLUCOSE: 112 mg/dL — AB (ref 70–99)
Potassium: 5.2 mEq/L — ABNORMAL HIGH (ref 3.5–5.1)
SODIUM: 140 meq/L (ref 135–145)

## 2017-03-20 ENCOUNTER — Encounter: Payer: Self-pay | Admitting: *Deleted

## 2017-03-20 ENCOUNTER — Other Ambulatory Visit: Payer: Self-pay | Admitting: *Deleted

## 2017-03-20 DIAGNOSIS — N182 Chronic kidney disease, stage 2 (mild): Secondary | ICD-10-CM | POA: Insufficient documentation

## 2017-03-28 ENCOUNTER — Encounter: Payer: Self-pay | Admitting: Family Medicine

## 2017-03-28 NOTE — Progress Notes (Signed)
Pt with vit B12 deficiency. Agree with vit b12 injection if office today.  Signed:  Crissie Sickles, MD           03/28/2017

## 2017-04-02 ENCOUNTER — Other Ambulatory Visit (INDEPENDENT_AMBULATORY_CARE_PROVIDER_SITE_OTHER): Payer: Medicare Other

## 2017-04-02 ENCOUNTER — Telehealth: Payer: Self-pay | Admitting: Family Medicine

## 2017-04-02 DIAGNOSIS — N182 Chronic kidney disease, stage 2 (mild): Secondary | ICD-10-CM | POA: Diagnosis not present

## 2017-04-02 LAB — BASIC METABOLIC PANEL
BUN: 10 mg/dL (ref 6–23)
CALCIUM: 9.8 mg/dL (ref 8.4–10.5)
CO2: 29 mEq/L (ref 19–32)
CREATININE: 1.29 mg/dL (ref 0.40–1.50)
Chloride: 104 mEq/L (ref 96–112)
GFR: 57.87 mL/min — AB (ref >60.00)
Glucose, Bld: 101 mg/dL — ABNORMAL HIGH (ref 70–99)
Potassium: 5.3 mEq/L — ABNORMAL HIGH (ref 3.5–5.1)
Sodium: 138 mEq/L (ref 135–145)

## 2017-04-02 NOTE — Telephone Encounter (Signed)
I reviewed the pt's bp's and heart rates he dropped off from July 10th through July 23rd and these look fine.  Reassure him.-thx

## 2017-04-03 NOTE — Telephone Encounter (Signed)
Pts wife advised and voiced understanding, okay per DPR. 

## 2017-04-09 NOTE — Addendum Note (Signed)
Addended by: Lianne Cure A on: 04/09/2017 04:14 PM   Modules accepted: Orders

## 2017-04-13 ENCOUNTER — Ambulatory Visit (INDEPENDENT_AMBULATORY_CARE_PROVIDER_SITE_OTHER): Payer: Medicare Other | Admitting: Family Medicine

## 2017-04-13 DIAGNOSIS — E538 Deficiency of other specified B group vitamins: Secondary | ICD-10-CM | POA: Diagnosis not present

## 2017-04-13 MED ORDER — CYANOCOBALAMIN 1000 MCG/ML IJ SOLN
1000.0000 ug | Freq: Once | INTRAMUSCULAR | Status: AC
  Administered 2017-04-13: 1000 ug via INTRAMUSCULAR

## 2017-04-13 NOTE — Progress Notes (Signed)
Patient presents to office for B12 injection.  Patient tolerated injection well.

## 2017-04-23 NOTE — Progress Notes (Signed)
Pt with vit B12 def. Agree with vit B12 1000 mcg IM in office today  Signed:  Crissie Sickles, MD           04/23/2017

## 2017-05-09 ENCOUNTER — Other Ambulatory Visit: Payer: Self-pay | Admitting: *Deleted

## 2017-05-09 DIAGNOSIS — E538 Deficiency of other specified B group vitamins: Secondary | ICD-10-CM

## 2017-05-15 ENCOUNTER — Other Ambulatory Visit (INDEPENDENT_AMBULATORY_CARE_PROVIDER_SITE_OTHER): Payer: Medicare Other | Admitting: Family Medicine

## 2017-05-15 ENCOUNTER — Ambulatory Visit: Payer: Medicare Other

## 2017-05-15 DIAGNOSIS — E538 Deficiency of other specified B group vitamins: Secondary | ICD-10-CM | POA: Diagnosis not present

## 2017-05-15 LAB — VITAMIN B12: Vitamin B-12: 635 pg/mL (ref 211–911)

## 2017-05-15 NOTE — Progress Notes (Signed)
Patient presented to office for B12 lab work and injection.  Patient had lab work for b12 prior to injection and tolerated both well.

## 2017-05-16 ENCOUNTER — Other Ambulatory Visit: Payer: Self-pay | Admitting: *Deleted

## 2017-05-16 DIAGNOSIS — E538 Deficiency of other specified B group vitamins: Secondary | ICD-10-CM

## 2017-05-16 MED ORDER — CYANOCOBALAMIN 1000 MCG/ML IJ SOLN
1000.0000 ug | INTRAMUSCULAR | Status: AC
  Administered 2017-11-09 – 2018-05-01 (×6): 1000 ug via INTRAMUSCULAR

## 2017-05-17 ENCOUNTER — Telehealth (INDEPENDENT_AMBULATORY_CARE_PROVIDER_SITE_OTHER): Payer: Self-pay | Admitting: *Deleted

## 2017-05-17 ENCOUNTER — Encounter (INDEPENDENT_AMBULATORY_CARE_PROVIDER_SITE_OTHER): Payer: Self-pay | Admitting: *Deleted

## 2017-05-17 NOTE — Telephone Encounter (Signed)
Patient needs trilyte 

## 2017-05-18 MED ORDER — PEG 3350-KCL-NA BICARB-NACL 420 G PO SOLR: 4000.0000 mL | Freq: Once | ORAL | 0 refills | Status: AC

## 2017-05-22 ENCOUNTER — Other Ambulatory Visit: Payer: Self-pay | Admitting: Family Medicine

## 2017-05-22 NOTE — Telephone Encounter (Signed)
SW pts wife who stated that pt to his Rx bottle to the pharmacy and they kept it. This request came from pts pharmacy. Please advise. Thanks.

## 2017-05-22 NOTE — Telephone Encounter (Signed)
[facility]  requesting RF for metoprolol succinate but pts med list has metoprolol tartrate. Please advise. Thanks.

## 2017-05-22 NOTE — Telephone Encounter (Signed)
Pls call pt and have him clarify which one he is on by having him read the label on his pill bottle.-thx

## 2017-05-22 NOTE — Telephone Encounter (Signed)
Left message for pt to call back  °

## 2017-05-23 ENCOUNTER — Other Ambulatory Visit: Payer: Self-pay | Admitting: Family Medicine

## 2017-05-23 NOTE — Telephone Encounter (Signed)
Will RF the metoprolol succinate (toprol xl) and I'll change his med list to reflect this.

## 2017-05-30 ENCOUNTER — Telehealth (INDEPENDENT_AMBULATORY_CARE_PROVIDER_SITE_OTHER): Payer: Self-pay | Admitting: *Deleted

## 2017-05-30 NOTE — Telephone Encounter (Signed)
Referring MD/PCP: [email or web address]   Procedure: tcs  Reason/Indication:  Hx polyps  Has patient had this procedure before?  Yes, 2015  If so, when, by whom and where?    Is there a family history of colon cancer?  no  Who?  What age when diagnosed?    Is patient diabetic?   no      Does patient have prosthetic heart valve or mechanical valve?  no  Do you have a pacemaker?  no  Has patient ever had endocarditis? no  Has patient had joint replacement within last 12 months?  no  Does patient tend to be constipated or take laxatives? no  Does patient have a history of alcohol/drug use?  no  Is patient on Coumadin, Plavix and/or Aspirin? yes  Medications: see epic  Allergies: see epic  Medication Adjustment per Dr Laural Golden: asa 2 days  Procedure date & time: 06/28/17 at 100

## 2017-05-31 NOTE — Telephone Encounter (Signed)
agree

## 2017-06-14 ENCOUNTER — Ambulatory Visit: Payer: Medicare Other

## 2017-06-14 ENCOUNTER — Encounter (INDEPENDENT_AMBULATORY_CARE_PROVIDER_SITE_OTHER): Payer: Self-pay | Admitting: *Deleted

## 2017-06-15 ENCOUNTER — Ambulatory Visit (INDEPENDENT_AMBULATORY_CARE_PROVIDER_SITE_OTHER): Payer: Medicare Other

## 2017-06-15 DIAGNOSIS — E538 Deficiency of other specified B group vitamins: Secondary | ICD-10-CM | POA: Diagnosis not present

## 2017-06-15 DIAGNOSIS — Z23 Encounter for immunization: Secondary | ICD-10-CM | POA: Diagnosis not present

## 2017-06-15 MED ORDER — CYANOCOBALAMIN 1000 MCG/ML IJ SOLN
1000.0000 ug | Freq: Once | INTRAMUSCULAR | Status: AC
  Administered 2017-06-15: 1000 ug via INTRAMUSCULAR

## 2017-06-15 NOTE — Progress Notes (Signed)
Patient presents today for Vitamin B12 injection. Given with no incidence or problems. Patient left with no complaints.

## 2017-07-16 ENCOUNTER — Ambulatory Visit (INDEPENDENT_AMBULATORY_CARE_PROVIDER_SITE_OTHER): Payer: Medicare Other

## 2017-07-16 DIAGNOSIS — E538 Deficiency of other specified B group vitamins: Secondary | ICD-10-CM | POA: Diagnosis not present

## 2017-07-16 MED ORDER — CYANOCOBALAMIN 1000 MCG/ML IJ SOLN
1000.0000 ug | Freq: Once | INTRAMUSCULAR | Status: AC
  Administered 2017-07-16: 1000 ug via INTRAMUSCULAR

## 2017-07-16 NOTE — Progress Notes (Signed)
Patient presents today for Vitamin B12 injection, given with no incidence or problems. Patient left with no complaints.

## 2017-08-05 NOTE — Progress Notes (Signed)
Pt with Vit B12 def. Agree with vit B12 IM in office today.  Signed:  Crissie Sickles, MD           08/05/2017

## 2017-08-14 ENCOUNTER — Ambulatory Visit (INDEPENDENT_AMBULATORY_CARE_PROVIDER_SITE_OTHER): Payer: Medicare Other

## 2017-08-14 DIAGNOSIS — E538 Deficiency of other specified B group vitamins: Secondary | ICD-10-CM | POA: Diagnosis not present

## 2017-08-14 MED ORDER — CYANOCOBALAMIN 1000 MCG/ML IJ SOLN
1000.0000 ug | Freq: Once | INTRAMUSCULAR | Status: AC
  Administered 2017-08-14: 1000 ug via INTRAMUSCULAR

## 2017-08-14 NOTE — Progress Notes (Signed)
Patient presents today for Vitamin B12 injection. Given with no incidence or problems. Patient left with no complaints.

## 2017-08-23 ENCOUNTER — Telehealth (INDEPENDENT_AMBULATORY_CARE_PROVIDER_SITE_OTHER): Payer: Self-pay | Admitting: *Deleted

## 2017-08-23 NOTE — Telephone Encounter (Signed)
agree

## 2017-08-23 NOTE — Telephone Encounter (Signed)
Referring MD/PCP: [email or web address]   Procedure: tcs  Reason/Indication:  Hx polyps  Has patient had this procedure before?  Yes, 2015             If so, when, by whom and where?    Is there a family history of colon cancer?  no             Who?  What age when diagnosed?    Is patient diabetic?   no                                                  Does patient have prosthetic heart valve or mechanical valve?  no  Do you have a pacemaker?  no  Has patient ever had endocarditis? no  Has patient had joint replacement within last 12 months?  no  Does patient tend to be constipated or take laxatives? no  Does patient have a history of alcohol/drug use?  no  Is patient on Coumadin, Plavix and/or Aspirin? yes  Medications: see epic  Allergies: see epic  Medication Adjustment per Dr Laural Golden: asa 2 days  Procedure date & time: 09/20/17 at 50

## 2017-09-11 DIAGNOSIS — R7303 Prediabetes: Secondary | ICD-10-CM

## 2017-09-11 HISTORY — DX: Prediabetes: R73.03

## 2017-09-14 ENCOUNTER — Encounter: Payer: Self-pay | Admitting: Family Medicine

## 2017-09-14 ENCOUNTER — Ambulatory Visit (INDEPENDENT_AMBULATORY_CARE_PROVIDER_SITE_OTHER): Payer: Medicare Other | Admitting: Family Medicine

## 2017-09-14 VITALS — BP 110/74 | HR 48 | Temp 98.2°F | Resp 16 | Ht 67.5 in | Wt 149.0 lb

## 2017-09-14 DIAGNOSIS — E538 Deficiency of other specified B group vitamins: Secondary | ICD-10-CM

## 2017-09-14 DIAGNOSIS — Z0000 Encounter for general adult medical examination without abnormal findings: Secondary | ICD-10-CM

## 2017-09-14 DIAGNOSIS — Z125 Encounter for screening for malignant neoplasm of prostate: Secondary | ICD-10-CM

## 2017-09-14 DIAGNOSIS — E7800 Pure hypercholesterolemia, unspecified: Secondary | ICD-10-CM

## 2017-09-14 LAB — COMPREHENSIVE METABOLIC PANEL
ALBUMIN: 4.3 g/dL (ref 3.5–5.2)
ALK PHOS: 79 U/L (ref 39–117)
ALT: 5 U/L (ref 0–53)
AST: 9 U/L (ref 0–37)
BILIRUBIN TOTAL: 0.4 mg/dL (ref 0.2–1.2)
BUN: 16 mg/dL (ref 6–23)
CALCIUM: 9.6 mg/dL (ref 8.4–10.5)
CO2: 34 mEq/L — ABNORMAL HIGH (ref 19–32)
Chloride: 102 mEq/L (ref 96–112)
Creatinine, Ser: 1.2 mg/dL (ref 0.40–1.50)
GFR: 62.83 mL/min (ref >60.00)
Glucose, Bld: 94 mg/dL (ref 70–99)
POTASSIUM: 4.5 meq/L (ref 3.5–5.1)
Sodium: 141 mEq/L (ref 135–145)
TOTAL PROTEIN: 7 g/dL (ref 6.0–8.3)

## 2017-09-14 LAB — LIPID PANEL
CHOLESTEROL: 198 mg/dL (ref 0–200)
HDL: 46.5 mg/dL (ref >39.00)
LDL Cholesterol: 133 mg/dL — ABNORMAL HIGH (ref 0–99)
NonHDL: 151.93
TRIGLYCERIDES: 94 mg/dL (ref 0.0–149.0)
Total CHOL/HDL Ratio: 4
VLDL: 18.8 mg/dL (ref 0.0–40.0)

## 2017-09-14 MED ORDER — TETANUS-DIPHTH-ACELL PERTUSSIS 5-2.5-18.5 LF-MCG/0.5 IM SUSP: 0.5000 mL | Freq: Once | INTRAMUSCULAR | 0 refills | Status: AC

## 2017-09-14 MED ORDER — ZOSTER VAC RECOMB ADJUVANTED 50 MCG/0.5ML IM SUSR: 0.5000 mL | Freq: Once | INTRAMUSCULAR | 1 refills | Status: AC

## 2017-09-14 MED ORDER — CYANOCOBALAMIN 1000 MCG/ML IJ SOLN
1000.0000 ug | Freq: Once | INTRAMUSCULAR | Status: AC
  Administered 2017-09-14: 1000 ug via INTRAMUSCULAR

## 2017-09-14 NOTE — Patient Instructions (Signed)
Take your tetanus and shingles vaccine prescriptions to your pharmacy and they will administer these there.   Health Maintenance, Male A healthy lifestyle and preventive care is important for your health and wellness. Ask your health care provider about what schedule of regular examinations is right for you. What should I know about weight and diet? Eat a Healthy Diet  Eat plenty of vegetables, fruits, whole grains, low-fat dairy products, and lean protein.  Do not eat a lot of foods high in solid fats, added sugars, or salt.  Maintain a Healthy Weight Regular exercise can help you achieve or maintain a healthy weight. You should:  Do at least 150 minutes of exercise each week. The exercise should increase your heart rate and make you sweat (moderate-intensity exercise).  Do strength-training exercises at least twice a week.  Watch Your Levels of Cholesterol and Blood Lipids  Have your blood tested for lipids and cholesterol every 5 years starting at 75 years of age. If you are at high risk for heart disease, you should start having your blood tested when you are 75 years old. You may need to have your cholesterol levels checked more often if: ? Your lipid or cholesterol levels are high. ? You are older than 75 years of age. ? You are at high risk for heart disease.  What should I know about cancer screening? Many types of cancers can be detected early and may often be prevented. Lung Cancer  You should be screened every year for lung cancer if: ? You are a current smoker who has smoked for at least 30 years. ? You are a former smoker who has quit within the past 15 years.  Talk to your health care provider about your screening options, when you should start screening, and how often you should be screened.  Colorectal Cancer  Routine colorectal cancer screening usually begins at 75 years of age and should be repeated every 5-10 years until you are 75 years old. You may need to  be screened more often if early forms of precancerous polyps or small growths are found. Your health care provider may recommend screening at an earlier age if you have risk factors for colon cancer.  Your health care provider may recommend using home test kits to check for hidden blood in the stool.  A small camera at the end of a tube can be used to examine your colon (sigmoidoscopy or colonoscopy). This checks for the earliest forms of colorectal cancer.  Prostate and Testicular Cancer  Depending on your age and overall health, your health care provider may do certain tests to screen for prostate and testicular cancer.  Talk to your health care provider about any symptoms or concerns you have about testicular or prostate cancer.  Skin Cancer  Check your skin from head to toe regularly.  Tell your health care provider about any new moles or changes in moles, especially if: ? There is a change in a mole's size, shape, or color. ? You have a mole that is larger than a pencil eraser.  Always use sunscreen. Apply sunscreen liberally and repeat throughout the day.  Protect yourself by wearing long sleeves, pants, a wide-brimmed hat, and sunglasses when outside.  What should I know about heart disease, diabetes, and high blood pressure?  If you are 39-42 years of age, have your blood pressure checked every 3-5 years. If you are 37 years of age or older, have your blood pressure checked every year. You  should have your blood pressure measured twice-once when you are at a hospital or clinic, and once when you are not at a hospital or clinic. Record the average of the two measurements. To check your blood pressure when you are not at a hospital or clinic, you can use: ? An automated blood pressure machine at a pharmacy. ? A home blood pressure monitor.  Talk to your health care provider about your target blood pressure.  If you are between 58-81 years old, ask your health care provider if  you should take aspirin to prevent heart disease.  Have regular diabetes screenings by checking your fasting blood sugar level. ? If you are at a normal weight and have a low risk for diabetes, have this test once every three years after the age of 45. ? If you are overweight and have a high risk for diabetes, consider being tested at a younger age or more often.  A one-time screening for abdominal aortic aneurysm (AAA) by ultrasound is recommended for men aged 86-75 years who are current or former smokers. What should I know about preventing infection? Hepatitis B If you have a higher risk for hepatitis B, you should be screened for this virus. Talk with your health care provider to find out if you are at risk for hepatitis B infection. Hepatitis C Blood testing is recommended for:  Everyone born from [birth year] through 1963.  Anyone with known risk factors for hepatitis C.  Sexually Transmitted Diseases (STDs)  You should be screened each year for STDs including gonorrhea and chlamydia if: ? You are sexually active and are younger than 75 years of age. ? You are older than 75 years of age and your health care provider tells you that you are at risk for this type of infection. ? Your sexual activity has changed since you were last screened and you are at an increased risk for chlamydia or gonorrhea. Ask your health care provider if you are at risk.  Talk with your health care provider about whether you are at high risk of being infected with HIV. Your health care provider may recommend a prescription medicine to help prevent HIV infection.  What else can I do?  Schedule regular health, dental, and eye exams.  Stay current with your vaccines (immunizations).  Do not use any tobacco products, such as cigarettes, chewing tobacco, and e-cigarettes. If you need help quitting, ask your health care provider.  Limit alcohol intake to no more than 2 drinks per day. One drink equals 12 ounces of  beer, 5 ounces of wine, or 1 ounces of hard liquor.  Do not use street drugs.  Do not share needles.  Ask your health care provider for help if you need support or information about quitting drugs.  Tell your health care provider if you often feel depressed.  Tell your health care provider if you have ever been abused or do not feel safe at home. This information is not intended to replace advice given to you by your health care provider. Make sure you discuss any questions you have with your health care provider. Document Released: 02/24/2008 Document Revised: 04/26/2016 Document Reviewed: 06/01/2015 Elsevier Interactive Patient Education  [employer].

## 2017-09-14 NOTE — Progress Notes (Signed)
Office Note 09/14/2017  CC:  Chief Complaint  Patient presents with  . Annual Exam    Pt is fasting.     HPI:  Jeffrey [name] is a 77 y.o. White male who is here for annual health maintenance exam.  Eyes: exam last year---has cataracts and will likely get surgery. Exercise: none, but not sedentary Dental: has dentures. Diet: not working on anything.  Past Medical History:  Diagnosis Date  . Adenomatous colon polyp    Dr. Laural Golden (28 polyps on 1st endo, 3 on 2nd endo a year later.  8 on colonoscopy 02/26/14--recall 3 yrs.  . Aortic insufficiency    echo 03/25/12-EF>55%, mild-mod Aortic regurg, sclerotic aortic valve,   . BPH (benign prostatic hyperplasia)    Dr. Lum Babe (pt is asymptomatic)  . Carotid artery occlusion    carotid dopplers 02/06/12-patent left carotid endarterectomy; right internal carotid 40-59%.  Repeat dopplers 03/2017, 40-59% stenosed bilat int carot--per vasc needs rpt 9 mo.  . Chronic renal insufficiency, stage II (mild)    Borderline stage II/III--CrCl @ 62.  No RAS on 2003 angiogram (done for + renal artery dopplers--deemed eventually to have been a false positive).  Dr. Gwenlyn Found.  . DDD (degenerative disc disease), lumbar 02/2013   with spondylosis and moderate spinal stenosis+ left L5 and S1 nerve root impingement  . Ectatic abdominal aorta ([place]) 08/08/2016   2.8 cm at widest point: repeat aortic u/s 08/2021.  Marland Kitchen Essential tremor   . Fatty liver 2006   Noted on abd u/s and renal u/s 2006/2007  . History of hyperkalemia 02/2017   mild; recommended pt cut his ARB in half.  . History of MI (myocardial infarction)    Large fixed inferior defect on myocardial perfusion imaging 03/2017.  Marland Kitchen Hyperlipidemia    Dr. Percival Spanish started statin 03/2017  . Hypertension   . PAD (peripheral artery disease) ([place])    carotids: Dr. Percival Spanish started pt on statin for CV risk reduction 03/2017  . Plantar wart of left foot 12/21/2012  . SOB (shortness of breath)    MET test  03/25/12-mildly abnormal  . Solitary pulmonary nodule 03/14/2013   31m RUL 03/08/13--stable on repeat 07/2013, 03/2015, and 01/2016.  No additional imaging is required.  . Tobacco dependence    ongoing as of 02/2016  . Vitamin B12 deficiency without anemia 2016   Intrinsic factor NEG: home vit B12 injections started 11/17/14    Past Surgical History:  Procedure Laterality Date  . APPENDECTOMY  remote  . Arm surgery  July 2010   Left arm cyst/Lipoma  . CARDIOVASCULAR STRESS TEST  03/2017   Low risk study (likely artifact seen, but no reversible ischemia, EF 59%, no wall motion abnormality.  . carotid dopplers  02/2015; 02/2016   2017:  R ICA 1-39%, L ICA 40-59%: no change from 2016. 03/2017 rpt 40-59% bilat ICA stenosis--vasc recommended rpt 9 mo.  .Marland KitchenCAROTID ENDARTERECTOMY  11/25/08   Left     ICA  . COLONOSCOPY N/A 02/26/2014   Recall 3 yrs: (+ polypectomy--tubular adenoma) Procedure: COLONOSCOPY;  Surgeon: NRogene Houston MD;  Location: [facility];  Service: Endoscopy;  Laterality: N/A;  830  . TONSILLECTOMY  remote  . TRANSTHORACIC ECHOCARDIOGRAM  03/2012   2013 --EF 55%, normal LV syst fxn, impaired diast relaxation, mild/mod aortic regurg.  .Marland KitchenUMBILICAL HERNIA REPAIR      Family History  Problem Relation Age of Onset  . Coronary artery disease Mother   . Stroke Mother   .  Diabetes Mother   . Hypertension Mother   . Parkinson's disease Mother   . Colon cancer Neg Hx     Social History   Socioeconomic History  . Marital status: Married    Spouse name: Not on file  . Number of children: Not on file  . Years of education: Not on file  . Highest education level: Not on file  Social Needs  . Financial resource strain: Not on file  . Food insecurity - worry: Not on file  . Food insecurity - inability: Not on file  . Transportation needs - medical: Not on file  . Transportation needs - non-medical: Not on file  Occupational History  . Not on file  Tobacco Use  . Smoking  status: Current Every Day Smoker    Packs/day: 0.50    Years: 55.00    Pack years: 27.50    Types: Cigarettes  . Smokeless tobacco: Never Used  Substance and Sexual Activity  . Alcohol use: No    Alcohol/week: 0.0 oz  . Drug use: No  . Sexual activity: Not on file  Other Topics Concern  . Not on file  Social History Narrative   Married, 2 children.   Orig from [place].   Retired from [employer].   Current smoker; 1 ppd (x 50 yrs).  No hx alc, no drugs.   Active person, no formal exercise.    Outpatient Medications Prior to Visit  Medication Sig Dispense Refill  . aspirin 81 MG tablet Take 81 mg by mouth daily.    Marland Kitchen gabapentin (NEURONTIN) 300 MG capsule 1 cap po qAM, 1 cap po q afternoon, and 2 caps po qhs (Patient taking differently: Take 300-600 mg by mouth 3 (three) times daily. Take 1 capsule by mouth in the morning and 1 capsule in the afternoon and 2 capsules by mouth at bedtime) 120 capsule 6  . metoprolol succinate (TOPROL-XL) 100 MG 24 hr tablet TAKE 1 TABLET BY MOUTH DAILY WITH OR IMMEDIATELY FOLLOWING A MEAL 90 tablet 3  . atorvastatin (LIPITOR) 10 MG tablet Take 1 tablet (10 mg total) by mouth daily. (Patient taking differently: Take 10 mg by mouth daily at 6 PM. ) 90 tablet 3  . varenicline (CHANTIX STARTING MONTH PAK) 0.5 MG X 11 & 1 MG X 42 tablet Take one 0.5 mg tablet by mouth once daily for 3 days, then increase to one 0.5 mg tablet twice daily for 4 days, then increase to one 1 mg tablet twice daily. (Patient not taking: Reported on 09/13/2017) 53 tablet 0   Facility-Administered Medications Prior to Visit  Medication Dose Route Frequency Provider Last Rate Last Dose  . cyanocobalamin ((VITAMIN B-12)) injection 1,000 mcg  1,000 mcg Intramuscular Q30 days Qualyn Oyervides, Adrian Blackwater, MD        Allergies  Allergen Reactions  . Clonidine Derivatives Rash    ROS Review of Systems  Constitutional: Negative for appetite change, chills, fatigue and fever.  HENT:  Negative for congestion, dental problem, ear pain and sore throat.   Eyes: Negative for discharge, redness and visual disturbance.  Respiratory: Negative for cough, chest tightness, shortness of breath and wheezing.   Cardiovascular: Negative for chest pain, palpitations and leg swelling.  Gastrointestinal: Negative for abdominal pain, blood in stool, diarrhea, nausea and vomiting.  Genitourinary: Negative for difficulty urinating, dysuria, flank pain, frequency, hematuria and urgency.  Musculoskeletal: Negative for arthralgias, back pain, joint swelling, myalgias and neck stiffness.  Hands and feet feel cold a lot, esp in cold weather. Sometimes they turn pale but never blue.  Skin: Negative for pallor and rash.  Neurological: Negative for dizziness, speech difficulty, weakness and headaches.  Hematological: Negative for adenopathy. Does not bruise/bleed easily.  Psychiatric/Behavioral: Negative for confusion and sleep disturbance. The patient is not nervous/anxious.     PE; Blood pressure 110/74, pulse (!) 48, temperature 98.2 F (36.8 C), temperature source Oral, resp. rate 16, height 5' 7.5" (1.715 m), weight 149 lb (67.6 kg), SpO2 100 %. Gen: Alert, well appearing.  Patient is oriented to person, place, time, and situation. AFFECT: pleasant, lucid thought and speech. ENT: Ears: EACs clear, normal epithelium.  TMs with good light reflex and landmarks bilaterally.  Eyes: no injection, icteris, swelling, or exudate.  EOMI, PERRLA. Nose: no drainage or turbinate edema/swelling.  No injection or focal lesion.  Mouth: lips without lesion/swelling.  Oral mucosa pink and moist.  Dentition intact and without obvious caries or gingival swelling.  Oropharynx without erythema, exudate, or swelling.  Neck: supple/nontender.  No LAD, mass, or TM.  Carotid pulses 2+ bilaterally, without bruits. CV: RRR, soft systolic murmur heard best at RUSB, no diastolic murmur.  No r/g.   LUNGS: CTA bilat,  nonlabored resps, good aeration in all lung fields. ABD: soft, NT, ND, BS normal.  No hepatospenomegaly or mass.  No bruits. EXT: no clubbing, cyanosis, or edema.  Musculoskeletal: no joint swelling, erythema, warmth, or tenderness.  ROM of all joints intact. Skin - no sores or suspicious lesions or rashes or color changes   Pertinent labs:  Lab Results  Component Value Date   VITAMINB12 635 05/15/2017    Lab Results  Component Value Date   TSH 2.28 11/12/2014   Lab Results  Component Value Date   WBC 7.5 02/27/2017   HGB 14.5 02/27/2017   HCT 43.3 02/27/2017   MCV 87.2 02/27/2017   PLT 248.0 02/27/2017   Lab Results  Component Value Date   CREATININE 1.29 04/02/2017   BUN 10 04/02/2017   NA 138 04/02/2017   K 5.3 (H) 04/02/2017   CL 104 04/02/2017   CO2 29 04/02/2017   Lab Results  Component Value Date   ALT 7 02/27/2017   AST 11 02/27/2017   ALKPHOS 73 02/27/2017   BILITOT 0.6 02/27/2017   Lab Results  Component Value Date   CHOL 142 02/22/2016   Lab Results  Component Value Date   HDL 37.90 (L) 02/22/2016   Lab Results  Component Value Date   LDLCALC 87 02/22/2016   Lab Results  Component Value Date   TRIG 84.0 02/22/2016   Lab Results  Component Value Date   CHOLHDL 4 02/22/2016   Lab Results  Component Value Date   PSA 0.85 12/23/2012   Lab Results  Component Value Date   HGBA1C 5.8 (H) 11/12/2014    ASSESSMENT AND PLAN:   Health maintenance exam: Reviewed age and gender appropriate health maintenance issues (prudent diet, regular exercise, health risks of tobacco and excessive alcohol, use of seatbelts, fire alarms in home, use of sunscreen).  Also reviewed age and gender appropriate health screening as well as vaccine recommendations. Vaccines: Tdap due--rx printed for pt.    Shingrix discussed--rx printed for pt. Labs: FLP, CMET (otherwise labs UTD). Prostate ca screening: no further screening recommended at this time: risks outweigh  benefits. Colon ca screening: hx of multiple adenomatous polyps--was due for recall 02/2017--has this scheduled for 09/17/16.  Vit B12  def: 1000 mcg vit B12 injection given IM today.  An After Visit Summary was printed and given to the patient.  FOLLOW UP:  Return in about 6 months (around 03/14/2018) for routine chronic illness f/u (fasting).  Signed:  Crissie Sickles, MD           09/14/2017

## 2017-09-16 ENCOUNTER — Encounter: Payer: Self-pay | Admitting: Family Medicine

## 2017-09-17 ENCOUNTER — Other Ambulatory Visit: Payer: Self-pay

## 2017-09-17 MED ORDER — ATORVASTATIN CALCIUM 20 MG PO TABS: 20.0000 mg | ORAL_TABLET | Freq: Every day | ORAL | 2 refills | Status: DC

## 2017-09-20 ENCOUNTER — Encounter (HOSPITAL_COMMUNITY): Payer: Self-pay | Admitting: *Deleted

## 2017-09-20 ENCOUNTER — Ambulatory Visit (HOSPITAL_COMMUNITY)
Admission: RE | Admit: 2017-09-20 | Discharge: 2017-09-20 | Disposition: A | Discharge status: Home / Self Care | Payer: Medicare Other | Source: Ambulatory Visit | Attending: Internal Medicine | Admitting: Internal Medicine

## 2017-09-20 ENCOUNTER — Other Ambulatory Visit: Payer: Self-pay

## 2017-09-20 ENCOUNTER — Encounter (HOSPITAL_COMMUNITY)
Admission: RE | Disposition: A | Discharge status: Home / Self Care | Payer: Self-pay | Source: Ambulatory Visit | Attending: Internal Medicine

## 2017-09-20 DIAGNOSIS — F17210 Nicotine dependence, cigarettes, uncomplicated: Secondary | ICD-10-CM | POA: Diagnosis not present

## 2017-09-20 DIAGNOSIS — Z1211 Encounter for screening for malignant neoplasm of colon: Secondary | ICD-10-CM | POA: Diagnosis present

## 2017-09-20 DIAGNOSIS — D122 Benign neoplasm of ascending colon: Secondary | ICD-10-CM | POA: Insufficient documentation

## 2017-09-20 DIAGNOSIS — I739 Peripheral vascular disease, unspecified: Secondary | ICD-10-CM | POA: Insufficient documentation

## 2017-09-20 DIAGNOSIS — N400 Enlarged prostate without lower urinary tract symptoms: Secondary | ICD-10-CM | POA: Diagnosis not present

## 2017-09-20 DIAGNOSIS — K6289 Other specified diseases of anus and rectum: Secondary | ICD-10-CM | POA: Diagnosis not present

## 2017-09-20 DIAGNOSIS — E538 Deficiency of other specified B group vitamins: Secondary | ICD-10-CM | POA: Diagnosis not present

## 2017-09-20 DIAGNOSIS — I351 Nonrheumatic aortic (valve) insufficiency: Secondary | ICD-10-CM | POA: Insufficient documentation

## 2017-09-20 DIAGNOSIS — K760 Fatty (change of) liver, not elsewhere classified: Secondary | ICD-10-CM | POA: Insufficient documentation

## 2017-09-20 DIAGNOSIS — I129 Hypertensive chronic kidney disease with stage 1 through stage 4 chronic kidney disease, or unspecified chronic kidney disease: Secondary | ICD-10-CM | POA: Insufficient documentation

## 2017-09-20 DIAGNOSIS — N182 Chronic kidney disease, stage 2 (mild): Secondary | ICD-10-CM | POA: Insufficient documentation

## 2017-09-20 DIAGNOSIS — R911 Solitary pulmonary nodule: Secondary | ICD-10-CM | POA: Insufficient documentation

## 2017-09-20 DIAGNOSIS — K644 Residual hemorrhoidal skin tags: Secondary | ICD-10-CM | POA: Diagnosis not present

## 2017-09-20 DIAGNOSIS — Z8249 Family history of ischemic heart disease and other diseases of the circulatory system: Secondary | ICD-10-CM | POA: Diagnosis not present

## 2017-09-20 DIAGNOSIS — Z09 Encounter for follow-up examination after completed treatment for conditions other than malignant neoplasm: Secondary | ICD-10-CM | POA: Diagnosis not present

## 2017-09-20 DIAGNOSIS — I77811 Abdominal aortic ectasia: Secondary | ICD-10-CM | POA: Diagnosis not present

## 2017-09-20 DIAGNOSIS — Z7982 Long term (current) use of aspirin: Secondary | ICD-10-CM | POA: Insufficient documentation

## 2017-09-20 DIAGNOSIS — Z86010 Personal history of colonic polyps: Secondary | ICD-10-CM

## 2017-09-20 DIAGNOSIS — K648 Other hemorrhoids: Secondary | ICD-10-CM | POA: Insufficient documentation

## 2017-09-20 DIAGNOSIS — D123 Benign neoplasm of transverse colon: Secondary | ICD-10-CM | POA: Insufficient documentation

## 2017-09-20 DIAGNOSIS — E785 Hyperlipidemia, unspecified: Secondary | ICD-10-CM | POA: Diagnosis not present

## 2017-09-20 DIAGNOSIS — G250 Essential tremor: Secondary | ICD-10-CM | POA: Diagnosis not present

## 2017-09-20 DIAGNOSIS — D120 Benign neoplasm of cecum: Secondary | ICD-10-CM | POA: Insufficient documentation

## 2017-09-20 DIAGNOSIS — Z888 Allergy status to other drugs, medicaments and biological substances status: Secondary | ICD-10-CM | POA: Diagnosis not present

## 2017-09-20 DIAGNOSIS — K5730 Diverticulosis of large intestine without perforation or abscess without bleeding: Secondary | ICD-10-CM | POA: Diagnosis not present

## 2017-09-20 DIAGNOSIS — I252 Old myocardial infarction: Secondary | ICD-10-CM | POA: Insufficient documentation

## 2017-09-20 DIAGNOSIS — Z79899 Other long term (current) drug therapy: Secondary | ICD-10-CM | POA: Insufficient documentation

## 2017-09-20 HISTORY — PX: COLONOSCOPY: SHX5424

## 2017-09-20 HISTORY — PX: POLYPECTOMY: SHX5525

## 2017-09-20 SURGERY — COLONOSCOPY
Anesthesia: Moderate Sedation

## 2017-09-20 MED ORDER — MIDAZOLAM HCL 5 MG/5ML IJ SOLN
INTRAMUSCULAR | Status: AC
  Filled 2017-09-20: qty 10

## 2017-09-20 MED ORDER — MIDAZOLAM HCL 5 MG/5ML IJ SOLN
INTRAMUSCULAR | Status: DC | PRN
  Administered 2017-09-20 (×2): 2 mg via INTRAVENOUS
  Administered 2017-09-20 (×2): 1 mg via INTRAVENOUS

## 2017-09-20 MED ORDER — MEPERIDINE HCL 50 MG/ML IJ SOLN
INTRAMUSCULAR | Status: DC | PRN
  Administered 2017-09-20 (×2): 25 mg

## 2017-09-20 MED ORDER — SODIUM CHLORIDE 0.9 % IV SOLN
INTRAVENOUS | Status: DC
  Administered 2017-09-20: 08:00:00 via INTRAVENOUS

## 2017-09-20 MED ORDER — MEPERIDINE HCL 50 MG/ML IJ SOLN
INTRAMUSCULAR | Status: AC
  Filled 2017-09-20: qty 1

## 2017-09-20 MED ORDER — STERILE WATER FOR IRRIGATION IR SOLN
Status: DC | PRN
  Administered 2017-09-20: 15 mL

## 2017-09-20 NOTE — Discharge Instructions (Signed)
Aspirin or NSAIDs for 1 week. Resume other medications as before. High-fiber diet. No driving for 24 hours. Physician will call with biopsy results.   Colonoscopy, Adult, Care After This sheet gives you information about how to care for yourself after your procedure. Your health care provider may also give you more specific instructions. If you have problems or questions, contact your health care provider. Dr Laural Golden: [phone].  After hours call the hospital and ask for the GI doctor on call.  That doctor will call you back. What can I expect after the procedure? After the procedure, it is common to have:  A small amount of blood in your stool for 24 hours after the procedure.  Some gas.  Mild abdominal cramping or bloating.  Follow these instructions at home: General instructions   For the first 24 hours after the procedure: ? Do not drive or use machinery. ? Do not sign important documents. ? Do not drink alcohol. ? Do your regular daily activities at a slower pace than normal. ? Rest often.  Take over-the-counter or prescription medicines only as told by your health care provider.  It is up to you to get the results of your procedure. Ask your health care provider, or the department performing the procedure, when your results will be ready. Relieving cramping and bloating  Try walking around when you have cramps or feel bloated.  Eating and drinking  Drink enough fluid to keep your urine clear or pale yellow.  Resume your normal diet as instructed by your health care provider.   Avoid drinking alcohol for as long as instructed by your health care provider. Contact a health care provider if:  You have blood in your stool 2-3 days after the procedure. Get help right away if:  You have more than a small spotting of blood in your stool.  You pass large blood clots in your stool.  Your abdomen is swollen.  You have nausea or vomiting.  You have a fever.  You  have increasing abdominal pain that is not relieved with medicine. This information is not intended to replace advice given to you by your health care provider. Make sure you discuss any questions you have with your health care provider. Document Released: 04/11/2004 Document Revised: 05/22/2016 Document Reviewed: 11/09/2015 Elsevier Interactive Patient Education  2018 [place].   High-Fiber Diet Fiber, also called dietary fiber, is a type of carbohydrate found in fruits, vegetables, whole grains, and beans. A high-fiber diet can have many health benefits. Your health care provider may recommend a high-fiber diet to help:  Prevent constipation. Fiber can make your bowel movements more regular.  Lower your cholesterol.  Relieve hemorrhoids, uncomplicated diverticulosis, or irritable bowel syndrome.  Prevent overeating as part of a weight-loss plan.  Prevent heart disease, type 2 diabetes, and certain cancers.  What is my plan? The recommended daily intake of fiber includes:  38 grams for men under age 40.  69 grams for men over age 16.  59 grams for women under age 10.  78 grams for women over age 46.  You can get the recommended daily intake of dietary fiber by eating a variety of fruits, vegetables, grains, and beans. Your health care provider may also recommend a fiber supplement if it is not possible to get enough fiber through your diet. What do I need to know about a high-fiber diet?  Fiber supplements have not been widely studied for their effectiveness, so it is better to get  fiber through food sources.  Always check the fiber content on thenutrition facts label of any prepackaged food. Look for foods that contain at least 5 grams of fiber per serving.  Ask your dietitian if you have questions about specific foods that are related to your condition, especially if those foods are not listed in the following section.  Increase your daily fiber consumption gradually.  Increasing your intake of dietary fiber too quickly may cause bloating, cramping, or gas.  Drink plenty of water. Water helps you to digest fiber. What foods can I eat? Grains Whole-grain breads. Multigrain cereal. Oats and oatmeal. Brown rice. Barley. Bulgur wheat. [place]. Bran muffins. Popcorn. Rye wafer crackers. Vegetables Sweet potatoes. Spinach. Kale. Artichokes. Cabbage. Broccoli. Green peas. Carrots. Squash. Fruits Berries. Pears. Apples. Oranges. Avocados. Prunes and raisins. Dried figs. Meats and Other Protein Sources [place], kidney, pinto, and soy beans. Split peas. Lentils. Nuts and seeds. Dairy Fiber-fortified yogurt. Beverages Fiber-fortified soy milk. Fiber-fortified orange juice. Other Fiber bars. The items listed above may not be a complete list of recommended foods or beverages. Contact your dietitian for more options. What foods are not recommended? Grains White bread. Pasta made with refined flour. White rice. Vegetables Fried potatoes. Canned vegetables. Well-cooked vegetables. Fruits Fruit juice. Cooked, strained fruit. Meats and Other Protein Sources Fatty cuts of meat. Fried Sales executive or fried fish. Dairy Milk. Yogurt. Cream cheese. Sour cream. Beverages Soft drinks. Other Cakes and pastries. Butter and oils. The items listed above may not be a complete list of foods and beverages to avoid. Contact your dietitian for more information. What are some tips for including high-fiber foods in my diet?  Eat a wide variety of high-fiber foods.  Make sure that half of all grains consumed each day are whole grains.  Replace breads and cereals made from refined flour or white flour with whole-grain breads and cereals.  Replace white rice with brown rice, bulgur wheat, or millet.  Start the day with a breakfast that is high in fiber, such as a cereal that contains at least 5 grams of fiber per serving.  Use beans in place of meat in soups, salads, or  pasta.  Eat high-fiber snacks, such as berries, raw vegetables, nuts, or popcorn. This information is not intended to replace advice given to you by your health care provider. Make sure you discuss any questions you have with your health care provider. Document Released: 08/28/2005 Document Revised: 02/03/2016 Document Reviewed: 02/10/2014 Elsevier Interactive Patient Education  [employer].

## 2017-09-20 NOTE — Op Note (Signed)
[facility] Patient Name: Jeffrey [name] Procedure Date: 09/20/2017 8:29 AM MRN: [id number] Date of Birth: 05-18-1941 Attending MD: Hildred Laser , MD CSN: [id number] Age: 77 Admit Type: Outpatient Procedure:                Colonoscopy Indications:              High risk colon cancer surveillance: Personal                            history of colonic polyps Providers:                Hildred Laser, MD, Janeece Riggers, RN, Charlsie Quest.                            Theda Sers RN, RN Referring MD:             Tammi Sou, MD Medicines:                Meperidine 50 mg IV, Midazolam 6 mg IV Complications:            No immediate complications. Estimated Blood Loss:     Estimated blood loss was minimal. Procedure:                Pre-Anesthesia Assessment:                           - Prior to the procedure, a History and Physical                            was performed, and patient medications and                            allergies were reviewed. The patient's tolerance of                            previous anesthesia was also reviewed. The risks                            and benefits of the procedure and the sedation                            options and risks were discussed with the patient.                            All questions were answered, and informed consent                            was obtained. Prior Anticoagulants: The patient                            last took aspirin 2 days prior to the procedure.                            ASA Grade Assessment: II - A patient with mild  systemic disease. After reviewing the risks and                            benefits, the patient was deemed in satisfactory                            condition to undergo the procedure.                           After obtaining informed consent, the colonoscope                            was passed under direct vision. Throughout the                            procedure, the  patient's blood pressure, pulse, and                            oxygen saturations were monitored continuously. The                            EC-[id number] ([id number]) scope was introduced through                            the anus and advanced to the the cecum, identified                            by appendiceal orifice and ileocecal valve. The                            colonoscopy was performed without difficulty. The                            patient tolerated the procedure well. The quality                            of the bowel preparation was adequate. The                            ileocecal valve, appendiceal orifice, and rectum                            were photographed. Scope In: 8:52:29 AM Scope Out: 9:43:54 AM Scope Withdrawal Time: 0 hours 27 minutes 31 seconds  Total Procedure Duration: 0 hours 51 minutes 25 seconds  Findings:      The perianal and digital rectal examinations were normal.      A large polyp was found in the cecum. The polyp was sessile. The polyp       was removed with a piecemeal technique using a hot snare. Polyp       resection was incomplete. The resected tissue was retrieved. Coagulation       for destruction of remaining portion of lesion using argon plasma was       successful. Bottle 2      Two sessile polyps  were found in the ascending colon. The polyps were 7       to 10 mm in size. These polyps were removed with a hot snare. Resection       and retrieval were complete. The pathology specimen was placed into       Bottle Number 3.      Four sessile polyps were found in the hepatic flexure and ascending       colon. The polyps were small in size. These polyps were removed with a       cold snare. Resection and retrieval were complete. The pathology       specimen was placed into Bottle Number 1.      Two sessile polyps were found in the hepatic flexure and ascending       colon. The polyps were small in size. These were biopsied with a cold        forceps for histology. The pathology specimen was placed into Bottle       Number 1.      A few medium-mouthed diverticula were found in the sigmoid colon.      External and internal hemorrhoids were found during retroflexion. The       hemorrhoids were medium-sized.      Anal papilla(e) were hypertrophied. Impression:               - One large polyp in the cecum, removed piecemeal                            using a hot snare. Incomplete resection. Resected                            tissue retrieved. Treated with argon plasma                            coagulation (APC).                           - Two 7 to 10 mm polyps in the ascending colon,                            removed with a hot snare. Resected and retrieved.                           - Four small polyps at the hepatic flexure and in                            the ascending colon, removed with a cold snare.                            Resected and retrieved.                           - Two small polyps at the hepatic flexure and in                            the ascending colon. Biopsied.                           -  Diverticulosis in the sigmoid colon.                           - External and internal hemorrhoids.                           - Anal papilla(e) were hypertrophied. Moderate Sedation:      Moderate (conscious) sedation was administered by the endoscopy nurse       and supervised by the endoscopist. The following parameters were       monitored: oxygen saturation, heart rate, blood pressure, CO2       capnography and response to care. Total physician intraservice time was       54 minutes. Recommendation:           - Patient has a contact number available for                            emergencies. The signs and symptoms of potential                            delayed complications were discussed with the                            patient. Return to normal activities tomorrow.                            Written  discharge instructions were provided to the                            patient.                           - High fiber diet today.                           - Continue present medications.                           - No aspirin, ibuprofen, naproxen, or other                            non-steroidal anti-inflammatory drugs for 7 days                            after polyp removal.                           - Await pathology results.                           - Repeat colonoscopy in 1 year for surveillance. Procedure Code(s):        --- Professional ---                           [id number], Colonoscopy, flexible; with removal of  tumor(s), polyp(s), or other lesion(s) by snare                            technique                           45380, 59, Colonoscopy, flexible; with biopsy,                            single or multiple                           99152, Moderate sedation services provided by the                            same physician or other qualified health care                            professional performing the diagnostic or                            therapeutic service that the sedation supports,                            requiring the presence of an independent trained                            observer to assist in the monitoring of the                            patient's level of consciousness and physiological                            status; initial 15 minutes of intraservice time,                            patient age 34 years or older                           [phone], Moderate sedation services; each additional                            15 minutes intraservice time                           99153, Moderate sedation services; each additional                            15 minutes intraservice time                           99153, Moderate sedation services; each additional                            15 minutes intraservice time Diagnosis  Code(s):        --- Professional ---  Z86.010, Personal history of colonic polyps                           D12.0, Benign neoplasm of cecum                           D12.3, Benign neoplasm of transverse colon (hepatic                            flexure or splenic flexure)                           D12.2, Benign neoplasm of ascending colon                           K64.8, Other hemorrhoids                           K62.89, Other specified diseases of anus and rectum                           K57.30, Diverticulosis of large intestine without                            perforation or abscess without bleeding CPT copyright 2016 [employer] Association. All rights reserved. The codes documented in this report are preliminary and upon coder review may  be revised to meet current compliance requirements. Hildred Laser, MD Hildred Laser, MD 09/20/2017 9:59:52 AM This report has been signed electronically. Number of Addenda: 0

## 2017-09-20 NOTE — Progress Notes (Signed)
Patient's discharge instructions explained to wife, Izora Gala.  Copy of discharge instructions given to Morton Stall.  She verbalized understanding.  Advised her to call us with any questions or concerns.

## 2017-09-20 NOTE — H&P (Signed)
Jeffrey [name] is an 77 y.o. male.   Chief Complaint: Patient is here for colonoscopy. HPI: Patient is 77 year old Caucasian male with history of multiple colonic adenomas who is here for surveillance colonoscopy.  Last exam was in June 2015 with removal of 8 polyps.  On prior exams he has had polyps removed as well.  He denies abdominal pain change in bowel habits or rectal bleeding. States he has good appetite.  He feels cold most of the time.  Last TSH was about 3 years ago. Family history is negative for CRC.  Past Medical History:  Diagnosis Date  . Adenomatous colon polyp    Dr. Laural Golden (28 polyps on 1st endo, 3 on 2nd endo a year later.  8 on colonoscopy 02/26/14--recall 3 yrs.  . Aortic insufficiency    echo 03/25/12-EF>55%, mild-mod Aortic regurg, sclerotic aortic valve,   . BPH (benign prostatic hyperplasia)    Dr. Lum Babe (pt is asymptomatic)  . Carotid artery occlusion    carotid dopplers 02/06/12-patent left carotid endarterectomy; right internal carotid 40-59%.  Repeat dopplers 03/2017, 40-59% stenosed bilat int carot--per vasc needs rpt 9 mo.  . Chronic renal insufficiency, stage II (mild)    Borderline stage II/III--CrCl @ 60.  No RAS on 2003 angiogram (done for + renal artery dopplers--deemed eventually to have been a false positive).  Dr. Gwenlyn Found.  . DDD (degenerative disc disease), lumbar 02/2013   with spondylosis and moderate spinal stenosis+ left L5 and S1 nerve root impingement  . Ectatic abdominal aorta ([place]) 08/08/2016   2.8 cm at widest point: repeat aortic u/s 08/2021.  Marland Kitchen Essential tremor   . Fatty liver 2006   Noted on abd u/s and renal u/s 2006/2007  . History of hyperkalemia 02/2017   mild; recommended pt cut his ARB in half.  . History of MI (myocardial infarction)    Large fixed inferior defect on myocardial perfusion imaging 03/2017.  Marland Kitchen Hyperlipidemia    Dr. Percival Spanish started statin 03/2017.    Marland Kitchen Hypertension   . PAD (peripheral artery disease) ([place])    carotids:  Dr. Percival Spanish started pt on statin for CV risk reduction 03/2017  . Plantar wart of left foot 12/21/2012  . SOB (shortness of breath)    MET test 03/25/12-mildly abnormal  . Solitary pulmonary nodule 03/14/2013   84m RUL 03/08/13--stable on repeat 07/2013, 03/2015, and 01/2016.  No additional imaging is required.  . Tobacco dependence    ongoing as of 02/2016  . Vitamin B12 deficiency without anemia 2016   Intrinsic factor NEG: home vit B12 injections started 11/17/14    Past Surgical History:  Procedure Laterality Date  . APPENDECTOMY  remote  . Arm surgery  July 2010   Left arm cyst/Lipoma  . CARDIOVASCULAR STRESS TEST  03/2017   Low risk study (likely artifact seen, but no reversible ischemia, EF 59%, no wall motion abnormality.  . carotid dopplers  02/2015; 02/2016   2017:  R ICA 1-39%, L ICA 40-59%: no change from 2016. 03/2017 rpt 40-59% bilat ICA stenosis--vasc recommended rpt 9 mo.  .Marland KitchenCAROTID ENDARTERECTOMY  11/25/08   Left     ICA  . COLONOSCOPY N/A 02/26/2014   Recall 3 yrs: (+ polypectomy--tubular adenoma) Procedure: COLONOSCOPY;  Surgeon: NRogene Houston MD;  [facility];  Service: Endoscopy;  Laterality: N/A;  830  . TONSILLECTOMY  remote  . TONSILLECTOMY    . TRANSTHORACIC ECHOCARDIOGRAM  03/2012   2013 --EF 55%, normal LV syst fxn, impaired  diast relaxation, mild/mod aortic regurg.  Marland Kitchen UMBILICAL HERNIA REPAIR      Family History  Problem Relation Age of Onset  . Coronary artery disease Mother   . Stroke Mother   . Diabetes Mother   . Hypertension Mother   . Parkinson's disease Mother   . Colon cancer Neg Hx    Social History:  reports that he has been smoking cigarettes.  He has a 60.00 pack-year smoking history. he has never used smokeless tobacco. He reports that he does not drink alcohol or use drugs.  Allergies:  Allergies  Allergen Reactions  . Clonidine Derivatives Rash    Facility-Administered Medications Prior to Admission  Medication Dose Route  Frequency Provider Last Rate Last Dose  . cyanocobalamin ((VITAMIN B-12)) injection 1,000 mcg  1,000 mcg Intramuscular Q30 days McGowen, Adrian Blackwater, MD       Medications Prior to Admission  Medication Sig Dispense Refill  . aspirin 81 MG tablet Take 81 mg by mouth daily.    Marland Kitchen gabapentin (NEURONTIN) 300 MG capsule 1 cap po qAM, 1 cap po q afternoon, and 2 caps po qhs (Patient taking differently: Take 300-600 mg by mouth 3 (three) times daily. Take 1 capsule by mouth in the morning and 1 capsule in the afternoon and 2 capsules by mouth at bedtime) 120 capsule 6  . metoprolol succinate (TOPROL-XL) 100 MG 24 hr tablet TAKE 1 TABLET BY MOUTH DAILY WITH OR IMMEDIATELY FOLLOWING A MEAL 90 tablet 3  . atorvastatin (LIPITOR) 20 MG tablet Take 1 tablet (20 mg total) by mouth daily. 30 tablet 2    No results found for this or any previous visit (from the past 48 hour(s)). No results found.  ROS  Blood pressure (!) 196/78, pulse 68, temperature 97.7 F (36.5 C), height _0  (1.753 m), weight 140 lb (63.5 kg), SpO2 100 %. Physical Exam  Constitutional: He appears well-developed and well-nourished.  HENT:  Mouth/Throat: Oropharynx is clear and moist.  Eyes: Conjunctivae are normal. No scleral icterus.  Neck: No thyromegaly present.  Cardiovascular: Normal rate, regular rhythm and normal heart sounds.  No murmur heard. Respiratory: Effort normal and breath sounds normal.  GI:  Abdomen is flat.  Small appendectomy scar noted.  Abdomen is soft and nontender without organomegaly or masses.  Musculoskeletal: He exhibits no edema.  Lymphadenopathy:    He has no cervical adenopathy.  Neurological: He is alert.  Skin: Skin is warm and dry.     Assessment/Plan History of colonic adenomas. Surveillance colonoscopy.  Hildred Laser, MD 09/20/2017, 8:43 AM

## 2017-09-21 ENCOUNTER — Encounter: Payer: Self-pay | Admitting: Family Medicine

## 2017-09-24 ENCOUNTER — Telehealth (INDEPENDENT_AMBULATORY_CARE_PROVIDER_SITE_OTHER): Payer: Self-pay | Admitting: Internal Medicine

## 2017-09-24 ENCOUNTER — Encounter (HOSPITAL_COMMUNITY): Payer: Self-pay | Admitting: Internal Medicine

## 2017-09-25 NOTE — Telephone Encounter (Signed)
Patient called no answer. Will try later.

## 2017-10-01 ENCOUNTER — Telehealth (INDEPENDENT_AMBULATORY_CARE_PROVIDER_SITE_OTHER): Payer: Self-pay | Admitting: *Deleted

## 2017-10-01 NOTE — Telephone Encounter (Signed)
Patient's daughter Jeffrey [name] called -- Mr Knippel is still having stomach pain -- she said you told them if it continued you would need to see him -- please advise what we need to do  [place]: [phone]

## 2017-10-01 NOTE — Telephone Encounter (Signed)
Talked with patient's wife Izora Gala.Marland Kitchen He is having less pain today.  He is having normal bowel movements.  He is not running fever and also not having nausea or vomiting. She said he would not come to the office today. Will see patient in the morning if pain persists.

## 2017-10-02 ENCOUNTER — Telehealth (INDEPENDENT_AMBULATORY_CARE_PROVIDER_SITE_OTHER): Payer: Self-pay | Admitting: *Deleted

## 2017-10-02 NOTE — Telephone Encounter (Signed)
She wanted you to know he is feeling so much better today and they appreciate all your concern and help.

## 2017-10-02 NOTE — Telephone Encounter (Signed)
Noted  

## 2017-10-12 ENCOUNTER — Ambulatory Visit (INDEPENDENT_AMBULATORY_CARE_PROVIDER_SITE_OTHER): Payer: Medicare Other

## 2017-10-12 DIAGNOSIS — E538 Deficiency of other specified B group vitamins: Secondary | ICD-10-CM

## 2017-10-12 MED ORDER — CYANOCOBALAMIN 1000 MCG/ML IJ SOLN
1000.0000 ug | Freq: Once | INTRAMUSCULAR | Status: AC
  Administered 2017-10-12: 1000 ug via INTRAMUSCULAR

## 2017-10-12 NOTE — Progress Notes (Signed)
Patient presents today for Vitamin B12 injection, given with no incidence or problems Patient left with no complaints.

## 2017-10-15 NOTE — Progress Notes (Signed)
Pt with vit B12 def. Agree with vit B12 1000 mcg IM in office today  Signed:  Crissie Sickles, MD           10/15/2017

## 2017-11-09 ENCOUNTER — Ambulatory Visit (INDEPENDENT_AMBULATORY_CARE_PROVIDER_SITE_OTHER): Payer: Medicare Other | Admitting: *Deleted

## 2017-11-09 DIAGNOSIS — E538 Deficiency of other specified B group vitamins: Secondary | ICD-10-CM

## 2017-11-09 NOTE — Progress Notes (Addendum)
Pt came into office today for his monthly b12 injection. Last injection was given 10/12/17. Pt tolerated injection well. Pt will return in 1 month for next injection.   Medical screening examination/treatment/procedure(s) were performed by non-physician practitioner and as supervising physician I was immediately available for consultation/collaboration.  I agree with above assessment and plan.  Electronically Signed by: Howard Pouch, DO [facility]

## 2017-12-10 ENCOUNTER — Encounter: Payer: Self-pay | Admitting: Family Medicine

## 2017-12-10 ENCOUNTER — Ambulatory Visit: Payer: Medicare Other | Admitting: Family Medicine

## 2017-12-10 ENCOUNTER — Ambulatory Visit: Payer: Medicare Other

## 2017-12-10 VITALS — BP 161/76 | HR 39 | Temp 97.6°F | Resp 16 | Ht 67.5 in | Wt 149.0 lb

## 2017-12-10 DIAGNOSIS — R5382 Chronic fatigue, unspecified: Secondary | ICD-10-CM

## 2017-12-10 DIAGNOSIS — R001 Bradycardia, unspecified: Secondary | ICD-10-CM | POA: Diagnosis not present

## 2017-12-10 DIAGNOSIS — E538 Deficiency of other specified B group vitamins: Secondary | ICD-10-CM | POA: Diagnosis not present

## 2017-12-10 DIAGNOSIS — R7301 Impaired fasting glucose: Secondary | ICD-10-CM | POA: Diagnosis not present

## 2017-12-10 LAB — CBC WITH DIFFERENTIAL/PLATELET
BASOS PCT: 1.2 % (ref 0.0–3.0)
Basophils Absolute: 0.1 10*3/uL (ref 0.0–0.1)
EOS PCT: 2.8 % (ref 0.0–5.0)
Eosinophils Absolute: 0.2 10*3/uL (ref 0.0–0.7)
HEMATOCRIT: 43.6 % (ref 39.0–52.0)
HEMOGLOBIN: 14.6 g/dL (ref 13.0–17.0)
LYMPHS PCT: 20.5 % (ref 12.0–46.0)
Lymphs Abs: 1.5 10*3/uL (ref 0.7–4.0)
MCHC: 33.6 g/dL (ref 30.0–36.0)
MCV: 88 fl (ref 78.0–100.0)
MONOS PCT: 9.6 % (ref 3.0–12.0)
Monocytes Absolute: 0.7 10*3/uL (ref 0.1–1.0)
Neutro Abs: 4.9 10*3/uL (ref 1.4–7.7)
Neutrophils Relative %: 65.9 % (ref 43.0–77.0)
Platelets: 232 10*3/uL (ref 150.0–400.0)
RBC: 4.95 Mil/uL (ref 4.22–5.81)
RDW: 14.9 % (ref 11.5–15.5)
WBC: 7.5 10*3/uL (ref 4.0–10.5)

## 2017-12-10 LAB — COMPREHENSIVE METABOLIC PANEL
ALBUMIN: 4 g/dL (ref 3.5–5.2)
ALK PHOS: 60 U/L (ref 39–117)
ALT: 6 U/L (ref 0–53)
AST: 10 U/L (ref 0–37)
BUN: 14 mg/dL (ref 6–23)
CALCIUM: 9.6 mg/dL (ref 8.4–10.5)
CO2: 30 mEq/L (ref 19–32)
Chloride: 103 mEq/L (ref 96–112)
Creatinine, Ser: 1.28 mg/dL (ref 0.40–1.50)
GFR: 58.28 mL/min — AB (ref >60.00)
Glucose, Bld: 87 mg/dL (ref 70–99)
POTASSIUM: 4.5 meq/L (ref 3.5–5.1)
SODIUM: 138 meq/L (ref 135–145)
TOTAL PROTEIN: 6.9 g/dL (ref 6.0–8.3)
Total Bilirubin: 0.6 mg/dL (ref 0.2–1.2)

## 2017-12-10 LAB — HEMOGLOBIN A1C: Hgb A1c MFr Bld: 5.9 % (ref 4.6–6.5)

## 2017-12-10 LAB — TSH: TSH: 3.96 u[IU]/mL (ref 0.35–4.50)

## 2017-12-10 NOTE — Progress Notes (Signed)
OFFICE VISIT  12/10/2017   CC:  Chief Complaint  Patient presents with  . Fatigue    HPI:    Patient is a 77 y.o. Caucasian male with HTN, HLD, vitalmin B12 deficiency, and CRI with GFR 60 ml/min who presents for several weeks of feeling fatigued or "just feeling off".  Wants to sleep all the time.  Snores very little, no apnea. No CP or SOB with exercise.   Most recent B12 inj was 11/09/17. No new meds.  No dizziness or lightheadnesss, no palpitations. No melena or hematochezia.  No abd pain.  No signif cough.  No recent URI sx's.  Appetite: poor. Intermittent cold intolerance, mostly hands and feet (turn pale in hands and feet--this is chronic)  Chronic urinary frequency and urgency.  No dysuria.  Past Medical History:  Diagnosis Date  . Adenomatous colon polyp    Dr. Laural Golden (28 polyps on 1st endo, 3 on 2nd endo a year later.  8 on colonoscopy 02/26/14--recall 3 yrs.  More on 09/20/17 TCS--recall 3-5 yrs.  . Aortic insufficiency    echo 03/25/12-EF>55%, mild-mod Aortic regurg, sclerotic aortic valve,   . BPH (benign prostatic hyperplasia)    Dr. Lum Babe (pt is asymptomatic)  . Carotid artery occlusion    carotid dopplers 02/06/12-patent left carotid endarterectomy; right internal carotid 40-59%.  Repeat dopplers 03/2017, 40-59% stenosed bilat int carot--per vasc needs rpt 9 mo.  . Chronic renal insufficiency, stage II (mild)    Borderline stage II/III--CrCl @ 62.  No RAS on 2003 angiogram (done for + renal artery dopplers--deemed eventually to have been a false positive).  Dr. Gwenlyn Found.  . DDD (degenerative disc disease), lumbar 02/2013   with spondylosis and moderate spinal stenosis+ left L5 and S1 nerve root impingement  . Ectatic abdominal aorta ([place]) 08/08/2016   2.8 cm at widest point: repeat aortic u/s 08/2021.  Marland Kitchen Essential tremor   . Fatty liver 2006   Noted on abd u/s and renal u/s 2006/2007  . History of hyperkalemia 02/2017   mild; recommended pt cut his ARB in half.  . History  of MI (myocardial infarction)    Large fixed inferior defect on myocardial perfusion imaging 03/2017.  Marland Kitchen Hyperlipidemia    Dr. Percival Spanish started statin 03/2017.    Marland Kitchen Hypertension   . PAD (peripheral artery disease) ([place])    carotids: Dr. Percival Spanish started pt on statin for CV risk reduction 03/2017  . Plantar wart of left foot 12/21/2012  . SOB (shortness of breath)    MET test 03/25/12-mildly abnormal  . Solitary pulmonary nodule 03/14/2013   32m RUL 03/08/13--stable on repeat 07/2013, 03/2015, and 01/2016.  No additional imaging is required.  . Tobacco dependence    ongoing as of 02/2016  . Vitamin B12 deficiency without anemia 2016   Intrinsic factor NEG: home vit B12 injections started 11/17/14    Past Surgical History:  Procedure Laterality Date  . APPENDECTOMY  remote  . Arm surgery  July 2010   Left arm cyst/Lipoma  . CARDIOVASCULAR STRESS TEST  03/2017   Low risk study (likely artifact seen, but no reversible ischemia, EF 59%, no wall motion abnormality.  . carotid dopplers  02/2015; 02/2016   2017:  R ICA 1-39%, L ICA 40-59%: no change from 2016. 03/2017 rpt 40-59% bilat ICA stenosis--vasc recommended rpt 9 mo.  .Marland KitchenCAROTID ENDARTERECTOMY  11/25/08   Left     ICA  . COLONOSCOPY N/A 02/26/2014   Recall 3 yrs: (+ polypectomy--tubular adenoma) Procedure:  COLONOSCOPY;  Surgeon: Rogene Houston, MD;  [facility];  Service: Endoscopy;  Laterality: N/A;  830  . COLONOSCOPY N/A 09/20/2017   Procedure: COLONOSCOPY;  Surgeon: Rogene Houston, MD;  [facility];  Service: Endoscopy;  Laterality: N/A;  730  . COLONOSCOPY W/ POLYPECTOMY  09/20/2017   Adenomatous polyp: recall 3-5 yrs (Dr. Laural Golden)  . POLYPECTOMY  09/20/2017   Procedure: POLYPECTOMY;  Surgeon: Rogene Houston, MD;  [facility];  Service: Endoscopy;;  Ascending colon, Transverse colon, Hepatic flexure, Cecum  . TONSILLECTOMY  remote  . TONSILLECTOMY    . TRANSTHORACIC ECHOCARDIOGRAM  03/2012   2013 --EF  55%, normal LV syst fxn, impaired diast relaxation, mild/mod aortic regurg.  Marland Kitchen UMBILICAL HERNIA REPAIR      Outpatient Medications Prior to Visit  Medication Sig Dispense Refill  . aspirin 81 MG tablet Take 1 tablet (81 mg total) by mouth daily. 30 tablet   . atorvastatin (LIPITOR) 20 MG tablet Take 1 tablet (20 mg total) by mouth daily. 30 tablet 2  . gabapentin (NEURONTIN) 300 MG capsule 1 cap po qAM, 1 cap po q afternoon, and 2 caps po qhs (Patient taking differently: Take 300-600 mg by mouth 3 (three) times daily. Take 1 capsule by mouth in the morning and 1 capsule in the afternoon and 2 capsules by mouth at bedtime) 120 capsule 6  . metoprolol succinate (TOPROL-XL) 100 MG 24 hr tablet TAKE 1 TABLET BY MOUTH DAILY WITH OR IMMEDIATELY FOLLOWING A MEAL 90 tablet 3   Facility-Administered Medications Prior to Visit  Medication Dose Route Frequency Provider Last Rate Last Dose  . cyanocobalamin ((VITAMIN B-12)) injection 1,000 mcg  1,000 mcg Intramuscular Q30 days Tammi Sou, MD   1,000 mcg at 12/10/17 9833    Allergies  Allergen Reactions  . Clonidine Derivatives Rash    ROS As per HPI  PE: Blood pressure (!) 161/76, pulse (!) 39, temperature 97.6 F (36.4 C), temperature source Oral, resp. rate 16, height 5' 7.5" (1.715 m), weight 149 lb (67.6 kg), SpO2 100 %. Gen: Alert, well appearing.  Patient is oriented to person, place, time, and situation. AFFECT: pleasant, lucid thought and speech. ASN:KNLZ: no injection, icteris, swelling, or exudate.  EOMI, PERRLA. Mouth: lips without lesion/swelling.  Oral mucosa pink and moist. Oropharynx without erythema, exudate, or swelling.  CV: bradycardic, regular, no m/r/g. Chest is clear, no wheezing or rales. Normal symmetric air entry throughout both lung fields. No chest wall deformities or tenderness. EXT: no c/c/e  No signif pallor in hands or feet.  LABS:  Lab Results  Component Value Date   TSH 2.28 11/12/2014   Lab Results   Component Value Date   WBC 7.5 02/27/2017   HGB 14.5 02/27/2017   HCT 43.3 02/27/2017   MCV 87.2 02/27/2017   PLT 248.0 02/27/2017   Lab Results  Component Value Date   CREATININE 1.20 09/14/2017   BUN 16 09/14/2017   NA 141 09/14/2017   K 4.5 09/14/2017   CL 102 09/14/2017   CO2 34 (H) 09/14/2017   Lab Results  Component Value Date   ALT 5 09/14/2017   AST 9 09/14/2017   ALKPHOS 79 09/14/2017   BILITOT 0.4 09/14/2017   Lab Results  Component Value Date   CHOL 198 09/14/2017   Lab Results  Component Value Date   HDL 46.50 09/14/2017   Lab Results  Component Value Date   LDLCALC 133 (H) 09/14/2017  Lab Results  Component Value Date   TRIG 94.0 09/14/2017   Lab Results  Component Value Date   CHOLHDL 4 09/14/2017   Lab Results  Component Value Date   PSA 0.85 12/23/2012   Lab Results  Component Value Date   HGBA1C 5.8 (H) 11/12/2014   12 lead EKG: sinus bradycardia, rate 40, 1st deg AV block, J point elevation V3 and V4, TWI V4 and ? V5, TWI aVL. Low voltage aVL Compared to EKG 02/27/17, 1st deg AV block is new.   IMPRESSION AND PLAN:  Fatigue, acute on chronic: suspect this is due to his bradycardia/chronotropic incompetence from his beta blocker. Check CBC, CMET, Hba1c, and TSH today.  Instructions.  Cut your toprol XL 100 pill in half and take once daily. Check blood pressure and heart rate daily.   An After Visit Summary was printed and given to the patient.  FOLLOW UP: Return in about 4 days (around 12/14/2017) for f/u fatigue/bradycardia.  Signed:  Crissie Sickles, MD           12/10/2017

## 2017-12-10 NOTE — Patient Instructions (Signed)
Cut your toprol XL 100 pill in half and take once daily. Check blood pressure and heart rate daily.

## 2017-12-11 ENCOUNTER — Encounter: Payer: Self-pay | Admitting: Family Medicine

## 2017-12-14 ENCOUNTER — Encounter: Payer: Self-pay | Admitting: Family Medicine

## 2017-12-14 ENCOUNTER — Other Ambulatory Visit: Payer: Self-pay | Admitting: Family Medicine

## 2017-12-14 ENCOUNTER — Ambulatory Visit: Payer: Medicare Other | Admitting: Family Medicine

## 2017-12-14 VITALS — BP 137/72 | HR 49 | Temp 98.6°F | Resp 16 | Ht 67.5 in | Wt 148.2 lb

## 2017-12-14 DIAGNOSIS — R5383 Other fatigue: Secondary | ICD-10-CM

## 2017-12-14 DIAGNOSIS — R001 Bradycardia, unspecified: Secondary | ICD-10-CM | POA: Diagnosis not present

## 2017-12-14 DIAGNOSIS — I10 Essential (primary) hypertension: Secondary | ICD-10-CM

## 2017-12-14 MED ORDER — AMLODIPINE BESYLATE 10 MG PO TABS: 10.0000 mg | ORAL_TABLET | Freq: Every day | ORAL | 1 refills | Status: DC

## 2017-12-14 NOTE — Patient Instructions (Addendum)
Stop toprol (metoprolol succinate).  Start the new blood pressure med called amlodipine that I sent to your pharmacy if bp top number is consistently >150 or bottom number is consistently >90 (at least 3 days in a row).

## 2017-12-14 NOTE — Progress Notes (Signed)
OFFICE VISIT  12/14/2017   CC:  Chief Complaint  Patient presents with  . Follow-up    fatigue/bradycarida   HPI:    Patient is a 77 y.o. Caucasian male who presents for 4 day f/u fatigue that I felt was likely due to bradycardia/chronotropic incompetence.  Labs at that time were all normal--see lab section below. I recommended he cut his toprol xl 150m tab in half to take qd. Has gradually improved, his best day was yesterday. Home pulses 41-48, home bp's avg 150s/70s.  ROS: no CP, no HAs, no dizziness, no myalgias or arthralgias, no palpitations, no SOB, no LE swelling.  Past Medical History:  Diagnosis Date  . Adenomatous colon polyp    Dr. RLaural Golden(28 polyps on 1st endo, 3 on 2nd endo a year later.  8 on colonoscopy 02/26/14--recall 3 yrs.  More on 09/20/17 TCS--recall 3-5 yrs.  . Aortic insufficiency    echo 03/25/12-EF>55%, mild-mod Aortic regurg, sclerotic aortic valve,   . BPH (benign prostatic hyperplasia)    Dr. JLum Babe(pt is asymptomatic)  . Carotid artery occlusion    carotid dopplers 02/06/12-patent left carotid endarterectomy; right internal carotid 40-59%.  Repeat dopplers 03/2017, 40-59% stenosed bilat int carot--per vasc needs rpt 9 mo.  . Chronic renal insufficiency, stage II (mild)    Borderline stage II/III--CrCl @ 62.  No RAS on 2003 angiogram (done for + renal artery dopplers--deemed eventually to have been a false positive).  Dr. BGwenlyn Found  . DDD (degenerative disc disease), lumbar 02/2013   with spondylosis and moderate spinal stenosis+ left L5 and S1 nerve root impingement  . [place] 08[place]   2.8 cm at widest point: repeat aortic u/s 08/2021.  .Marland KitchenEssential tremor   . Fatty liver 2006   Noted on abd u/s and renal u/s 2006/2007  . History of hyperkalemia 02/2017   mild; recommended pt cut his ARB in half.  . History of MI (myocardial infarction)    Large fixed inferior defect on myocardial perfusion imaging 03/2017.  .Marland KitchenHyperlipidemia    Dr. HPercival Spanishstarted statin 03/2017.    .Marland KitchenHypertension   . PAD (peripheral artery disease) ([place]    carotids: Dr. HPercival Spanishstarted pt on statin for CV risk reduction 03/2017  . Plantar wart of left foot 12/21/2012  . Prediabetes 2019   A1c 5.9%  . SOB (shortness of breath)    MET test 03/25/12-mildly abnormal  . Solitary pulmonary nodule 03/14/2013   761mRUL 03/08/13--stable on repeat 07/2013, 03/2015, and 01/2016.  No additional imaging is required.  . Tobacco dependence    ongoing as of 02/2016  . Vitamin B12 deficiency without anemia 2016   Intrinsic factor NEG: home vit B12 injections started 11/17/14    Past Surgical History:  Procedure Laterality Date  . APPENDECTOMY  remote  . Arm surgery  July 2010   Left arm cyst/Lipoma  . CARDIOVASCULAR STRESS TEST  03/2017   Low risk study (likely artifact seen, but no reversible ischemia, EF 59%, no wall motion abnormality.  . carotid dopplers  02/2015; 02/2016   2017:  R ICA 1-39%, L ICA 40-59%: no change from 2016. 03/2017 rpt 40-59% bilat ICA stenosis--vasc recommended rpt 9 mo.  . Marland KitchenAROTID ENDARTERECTOMY  11/25/08   Left     ICA  . COLONOSCOPY N/A 02/26/2014   Recall 3 yrs: (+ polypectomy--tubular adenoma) Procedure: COLONOSCOPY;  Surgeon: NaRogene HoustonMD;  [facility];  Service: Endoscopy;  Laterality: N/A;  830  .  COLONOSCOPY N/A 09/20/2017   Procedure: COLONOSCOPY;  Surgeon: Rogene Houston, MD;  [facility];  Service: Endoscopy;  Laterality: N/A;  730  . COLONOSCOPY W/ POLYPECTOMY  09/20/2017   Adenomatous polyp: recall 3-5 yrs (Dr. Laural Golden)  . POLYPECTOMY  09/20/2017   Procedure: POLYPECTOMY;  Surgeon: Rogene Houston, MD;  [facility];  Service: Endoscopy;;  Ascending colon, Transverse colon, Hepatic flexure, Cecum  . TONSILLECTOMY  remote  . TONSILLECTOMY    . TRANSTHORACIC ECHOCARDIOGRAM  03/2012   2013 --EF 55%, normal LV syst fxn, impaired diast relaxation, mild/mod aortic regurg.  Marland Kitchen UMBILICAL  HERNIA REPAIR      Outpatient Medications Prior to Visit  Medication Sig Dispense Refill  . aspirin 81 MG tablet Take 1 tablet (81 mg total) by mouth daily. 30 tablet   . atorvastatin (LIPITOR) 20 MG tablet Take 1 tablet (20 mg total) by mouth daily. 30 tablet 2  . gabapentin (NEURONTIN) 300 MG capsule 1 cap po qAM, 1 cap po q afternoon, and 2 caps po qhs (Patient taking differently: Take 300-600 mg by mouth 3 (three) times daily. Take 1 capsule by mouth in the morning and 1 capsule in the afternoon and 2 capsules by mouth at bedtime) 120 capsule 6  . metoprolol succinate (TOPROL-XL) 100 MG 24 hr tablet TAKE 1 TABLET BY MOUTH DAILY WITH OR IMMEDIATELY FOLLOWING A MEAL (Patient taking differently: taking half tablet daily) 90 tablet 3   Facility-Administered Medications Prior to Visit  Medication Dose Route Frequency Provider Last Rate Last Dose  . cyanocobalamin ((VITAMIN B-12)) injection 1,000 mcg  1,000 mcg Intramuscular Q30 days Tammi Sou, MD   1,000 mcg at 12/10/17 1497    Allergies  Allergen Reactions  . Clonidine Derivatives Rash    ROS As per HPI  PE: Blood pressure 137/72, pulse (!) 49, temperature 98.6 F (37 C), temperature source Oral, resp. rate 16, height 5' 7.5" (1.715 m), weight 148 lb 4 oz (67.2 kg), SpO2 94 %. Gen: Alert, well appearing.  Patient is oriented to person, place, time, and situation. AFFECT: pleasant, lucid thought and speech. CV: regular, rate 50s, no m/r/g. Chest is clear, no wheezing or rales. Normal symmetric air entry throughout both lung fields. No chest wall deformities or tenderness. EXT: no clubbing, cyanosis, or edema.    LABS:  Lab Results  Component Value Date   TSH 3.96 12/10/2017   Lab Results  Component Value Date   WBC 7.5 12/10/2017   HGB 14.6 12/10/2017   HCT 43.6 12/10/2017   MCV 88.0 12/10/2017   PLT 232.0 12/10/2017   Lab Results  Component Value Date   CREATININE 1.28 12/10/2017   BUN 14 12/10/2017   NA 138  12/10/2017   K 4.5 12/10/2017   CL 103 12/10/2017   CO2 30 12/10/2017   Lab Results  Component Value Date   ALT 6 12/10/2017   AST 10 12/10/2017   ALKPHOS 60 12/10/2017   BILITOT 0.6 12/10/2017   Lab Results  Component Value Date   CHOL 198 09/14/2017   Lab Results  Component Value Date   HDL 46.50 09/14/2017   Lab Results  Component Value Date   LDLCALC 133 (H) 09/14/2017   Lab Results  Component Value Date   TRIG 94.0 09/14/2017   Lab Results  Component Value Date   CHOLHDL 4 09/14/2017   Lab Results  Component Value Date   PSA 0.85 12/23/2012   Lab Results  Component  Value Date   HGBA1C 5.9 12/10/2017   Lab Results  Component Value Date   TRZNBVAP01 410 05/15/2017    IMPRESSION AND PLAN:  1) Fatigue: suspected due to bradycardia/chronotropic incompetence due to his toprol xl. He is improving some since cutting toprol dose in half, but I still feel he needs to get off beta blocker.   Stop toprol altogether.  Continue to monitor bp and HR (HR both at rest and after exertion). Start the new blood pressure med called amlodipine that I sent to your pharmacy if bp top number is consistently >150 or bottom number is consistently >90 (at least 3 days in a row).  (If he doesn't fully/persistently return to his baseline energy level with cessation of toprol, we may need to also stop his statin to see if this is having fatigue effects on him.)  2) HTN: bp elevated last few days, but occ normal as well. See #1 above.  An After Visit Summary was printed and given to the patient.  FOLLOW UP: Return in about 1 week (around 12/21/2017) for f/u bradycardia/fatigue/bp.  Signed:  Crissie Sickles, MD           12/14/2017

## 2017-12-21 ENCOUNTER — Ambulatory Visit: Payer: Medicare Other | Admitting: Family

## 2017-12-21 ENCOUNTER — Ambulatory Visit (HOSPITAL_COMMUNITY)
Admission: RE | Admit: 2017-12-21 | Discharge: 2017-12-21 | Disposition: A | Discharge status: Home / Self Care | Payer: Medicare Other | Source: Ambulatory Visit | Attending: Family | Admitting: Family

## 2017-12-21 ENCOUNTER — Encounter: Payer: Self-pay | Admitting: Family

## 2017-12-21 ENCOUNTER — Other Ambulatory Visit: Payer: Self-pay

## 2017-12-21 VITALS — BP 140/78 | HR 60 | Temp 97.5°F | Resp 16 | Ht 69.0 in | Wt 142.0 lb

## 2017-12-21 DIAGNOSIS — F17200 Nicotine dependence, unspecified, uncomplicated: Secondary | ICD-10-CM

## 2017-12-21 DIAGNOSIS — I6523 Occlusion and stenosis of bilateral carotid arteries: Secondary | ICD-10-CM

## 2017-12-21 DIAGNOSIS — Z9889 Other specified postprocedural states: Secondary | ICD-10-CM | POA: Insufficient documentation

## 2017-12-21 NOTE — Patient Instructions (Addendum)
Steps to Quit Smoking Smoking tobacco can be bad for your health. It can also affect almost every organ in your body. Smoking puts you and people around you at risk for many serious long-lasting (chronic) diseases. Quitting smoking is hard, but it is one of the best things that you can do for your health. It is never too late to quit. What are the benefits of quitting smoking? When you quit smoking, you lower your risk for getting serious diseases and conditions. They can include:  Lung cancer or lung disease.  Heart disease.  Stroke.  Heart attack.  Not being able to have children (infertility).  Weak bones (osteoporosis) and broken bones (fractures).  If you have coughing, wheezing, and shortness of breath, those symptoms may get better when you quit. You may also get sick less often. If you are pregnant, quitting smoking can help to lower your chances of having a baby of low birth weight. What can I do to help me quit smoking? Talk with your doctor about what can help you quit smoking. Some things you can do (strategies) include:  Quitting smoking totally, instead of slowly cutting back how much you smoke over a period of time.  Going to in-person counseling. You are more likely to quit if you go to many counseling sessions.  Using resources and support systems, such as: ? Online chats with a counselor. ? Phone quitlines. ? Printed self-help materials. ? Support groups or group counseling. ? Text messaging programs. ? Mobile phone apps or applications.  Taking medicines. Some of these medicines may have nicotine in them. If you are pregnant or breastfeeding, do not take any medicines to quit smoking unless your doctor says it is okay. Talk with your doctor about counseling or other things that can help you.  Talk with your doctor about using more than one strategy at the same time, such as taking medicines while you are also going to in-person counseling. This can help make  quitting easier. What things can I do to make it easier to quit? Quitting smoking might feel very hard at first, but there is a lot that you can do to make it easier. Take these steps:  Talk to your family and friends. Ask them to support and encourage you.  Call phone quitlines, reach out to support groups, or work with a counselor.  Ask people who smoke to not smoke around you.  Avoid places that make you want (trigger) to smoke, such as: ? Bars. ? Parties. ? Smoke-break areas at work.  Spend time with people who do not smoke.  Lower the stress in your life. Stress can make you want to smoke. Try these things to help your stress: ? Getting regular exercise. ? Deep-breathing exercises. ? Yoga. ? Meditating. ? Doing a body scan. To do this, close your eyes, focus on one area of your body at a time from head to toe, and notice which parts of your body are tense. Try to relax the muscles in those areas.  Download or buy apps on your mobile phone or tablet that can help you stick to your quit plan. There are many free apps, such as QuitGuide from the CDC (Centers for Disease Control and Prevention). You can find more support from smokefree.gov and other websites.  This information is not intended to replace advice given to you by your health care provider. Make sure you discuss any questions you have with your health care provider. Document Released: 06/24/2009 Document   Revised: 04/25/2016 Document Reviewed: 01/12/2015 Elsevier Interactive Patient Education  2018 Elsevier Inc.     Stroke Prevention Some health problems and behaviors may make it more likely for you to have a stroke. Below are ways to lessen your risk of having a stroke.  Be active for at least 30 minutes on most or all days.  Do not smoke. Try not to be around others who smoke.  Do not drink too much alcohol. ? Do not have more than 2 drinks a day if you are a man. ? Do not have more than 1 drink a day if you  are a woman and are not pregnant.  Eat healthy foods, such as fruits and vegetables. If you were put on a specific diet, follow the diet as told.  Keep your cholesterol levels under control through diet and medicines. Look for foods that are low in saturated fat, trans fat, cholesterol, and are high in fiber.  If you have diabetes, follow all diet plans and take your medicine as told.  Ask your doctor if you need treatment to lower your blood pressure. If you have high blood pressure (hypertension), follow all diet plans and take your medicine as told by your doctor.  If you are 18-39 years old, have your blood pressure checked every 3-5 years. If you are age 40 or older, have your blood pressure checked every year.  Keep a healthy weight. Eat foods that are low in calories, salt, saturated fat, trans fat, and cholesterol.  Do not take drugs.  Avoid birth control pills, if this applies. Talk to your doctor about the risks of taking birth control pills.  Talk to your doctor if you have sleep problems (sleep apnea).  Take all medicine as told by your doctor. ? You may be told to take aspirin or blood thinner medicine. Take this medicine as told by your doctor. ? Understand your medicine instructions.  Make sure any other conditions you have are being taken care of.  Get help right away if:  You suddenly lose feeling (you feel numb) or have weakness in your face, arm, or leg.  Your face or eyelid hangs down to one side.  You suddenly feel confused.  You have trouble talking (aphasia) or understanding what people are saying.  You suddenly have trouble seeing in one or both eyes.  You suddenly have trouble walking.  You are dizzy.  You lose your balance or your movements are clumsy (uncoordinated).  You suddenly have a very bad headache and you do not know the cause.  You have new chest pain.  Your heart feels like it is fluttering or skipping a beat (irregular  heartbeat). Do not wait to see if the symptoms above go away. Get help right away. Call your local emergency services (911 in [place] Do not drive yourself to the hospital. This information is not intended to replace advice given to you by your health care provider. Make sure you discuss any questions you have with your health care provider. Document Released: 02/27/2012 Document Revised: 02/03/2016 Document Reviewed: 02/28/2013 Elsevier Interactive Patient Education  2018 Elsevier Inc.  

## 2017-12-21 NOTE — Progress Notes (Signed)
Chief Complaint: Follow up Extracranial Carotid Artery Stenosis   History of Present Illness  Jeffrey [name] is a 77 y.o. male who is s/p left CEA in April 2010 by Dr. Kellie Simmering.  He denies any history of TIA or stroke symptom.Specifically he denies a history of amaurosis fugax or monocular blindness, unilateral facial drooping, hemiparesis/hemiplegia, or receptive or expressive aphasia.   He reports that he has had his kidney arteries checked due to uncontrolled hypertension, states this was not the problem; his antihypertensive medications are chronically adjusted.   He denies claudication symptoms in legs with walking, denies non healing wounds. 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  Pt Diabetic: No Pt smoker: smoker (2/3 ppd, started smoking at about age 55 yrs)  Pt meds include: Statin : Yes ASA: 81 mg ASA  Other anticoagulants/antiplatelets: no    Past Medical History:  Diagnosis Date  . Adenomatous colon polyp    Dr. Laural Golden (28 polyps on 1st endo, 3 on 2nd endo a year later.  8 on colonoscopy 02/26/14--recall 3 yrs.  More on 09/20/17 TCS--recall 3-5 yrs.  . Aortic insufficiency    echo 03/25/12-EF>55%, mild-mod Aortic regurg, sclerotic aortic valve,   . BPH (benign prostatic hyperplasia)    Dr. Lum Babe (pt is asymptomatic)  . Carotid artery occlusion    carotid dopplers 02/06/12-patent left carotid endarterectomy; right internal carotid 40-59%.  Repeat dopplers 03/2017, 40-59% stenosed bilat int carot--per vasc needs rpt 9 mo.  . Chronic renal insufficiency, stage II (mild)    Borderline stage II/III--CrCl @ 62.  No RAS on 2003 angiogram (done for + renal artery dopplers--deemed eventually to have been a false positive).  Dr. Gwenlyn Found.  . DDD (degenerative disc disease), lumbar 02/2013   with spondylosis and moderate spinal stenosis+ left L5 and S1 nerve root impingement  . Ectatic abdominal aorta ([place])  08/08/2016   2.8 cm at widest point: repeat aortic u/s 08/2021.  Marland Kitchen Essential tremor   . Fatty liver 2006   Noted on abd u/s and renal u/s 2006/2007  . History of hyperkalemia 02/2017   mild; recommended pt cut his ARB in half.  . History of MI (myocardial infarction)    Large fixed inferior defect on myocardial perfusion imaging 03/2017.  Marland Kitchen Hyperlipidemia    Dr. Percival Spanish started statin 03/2017.    Marland Kitchen Hypertension   . PAD (peripheral artery disease) ([place])    carotids: Dr. Percival Spanish started pt on statin for CV risk reduction 03/2017  . Plantar wart of left foot 12/21/2012  . Prediabetes 2019   A1c 5.9%  . SOB (shortness of breath)    MET test 03/25/12-mildly abnormal  . Solitary pulmonary nodule 03/14/2013   34m RUL 03/08/13--stable on repeat 07/2013, 03/2015, and 01/2016.  No additional imaging is required.  . Tobacco dependence    ongoing as of 02/2016  . Vitamin B12 deficiency without anemia 2016   Intrinsic factor NEG: home vit B12 injections started 11/17/14    Social History Social History   Tobacco Use  . Smoking status: Current Every Day Smoker    Packs/day: 1.00    Years: 60.00    Pack years: 60.00    Types: Cigarettes  . Smokeless tobacco: Never Used  Substance Use Topics  . Alcohol use: No    Alcohol/week: 0.0 oz  . Drug use: No    Family History Family History  Problem Relation Age of Onset  .  Coronary artery disease Mother   . Stroke Mother   . Diabetes Mother   . Hypertension Mother   . Parkinson's disease Mother   . Colon cancer Neg Hx     Surgical History Past Surgical History:  Procedure Laterality Date  . APPENDECTOMY  remote  . Arm surgery  July 2010   Left arm cyst/Lipoma  . CARDIOVASCULAR STRESS TEST  03/2017   Low risk study (likely artifact seen, but no reversible ischemia, EF 59%, no wall motion abnormality.  . carotid dopplers  02/2015; 02/2016   2017:  R ICA 1-39%, L ICA 40-59%: no change from 2016. 03/2017 rpt 40-59% bilat ICA stenosis--vasc  recommended rpt 9 mo.  Marland Kitchen CAROTID ENDARTERECTOMY  11/25/08   Left     ICA  . COLONOSCOPY N/A 02/26/2014   Recall 3 yrs: (+ polypectomy--tubular adenoma) Procedure: COLONOSCOPY;  Surgeon: Rogene Houston, MD;  [facility];  Service: Endoscopy;  Laterality: N/A;  830  . COLONOSCOPY N/A 09/20/2017   Procedure: COLONOSCOPY;  Surgeon: Rogene Houston, MD;  [facility];  Service: Endoscopy;  Laterality: N/A;  730  . COLONOSCOPY W/ POLYPECTOMY  09/20/2017   Adenomatous polyp: recall 3-5 yrs (Dr. Laural Golden)  . POLYPECTOMY  09/20/2017   Procedure: POLYPECTOMY;  Surgeon: Rogene Houston, MD;  [facility];  Service: Endoscopy;;  Ascending colon, Transverse colon, Hepatic flexure, Cecum  . TONSILLECTOMY  remote  . TONSILLECTOMY    . TRANSTHORACIC ECHOCARDIOGRAM  03/2012   2013 --EF 55%, normal LV syst fxn, impaired diast relaxation, mild/mod aortic regurg.  Marland Kitchen UMBILICAL HERNIA REPAIR      Allergies  Allergen Reactions  . Clonidine Derivatives Rash    Current Outpatient Medications  Medication Sig Dispense Refill  . amLODipine (NORVASC) 10 MG tablet Take 1 tablet (10 mg total) by mouth daily. 30 tablet 1  . aspirin 81 MG tablet Take 1 tablet (81 mg total) by mouth daily. 30 tablet   . atorvastatin (LIPITOR) 20 MG tablet Take 1 tablet (20 mg total) by mouth daily. 30 tablet 2  . gabapentin (NEURONTIN) 300 MG capsule 1 cap po qAM, 1 cap po q afternoon, and 2 caps po qhs (Patient taking differently: Take 300-600 mg by mouth 3 (three) times daily. Take 1 capsule by mouth in the morning and 1 capsule in the afternoon and 2 capsules by mouth at bedtime) 120 capsule 6  . metoprolol succinate (TOPROL-XL) 100 MG 24 hr tablet TAKE 1 TABLET BY MOUTH DAILY WITH OR IMMEDIATELY FOLLOWING A MEAL (Patient not taking: Reported on 12/21/2017) 90 tablet 3   Current Facility-Administered Medications  Medication Dose Route Frequency Provider Last Rate Last Dose  . cyanocobalamin ((VITAMIN  B-12)) injection 1,000 mcg  1,000 mcg Intramuscular Q30 days Tammi Sou, MD   1,000 mcg at 12/10/17 4580    Review of Systems : See HPI for pertinent positives and negatives.  Physical Examination  Vitals:   12/21/17 1402 12/21/17 1405 12/21/17 1406  BP: (!) 143/82 139/78 140/78  Pulse: 61 60 60  Resp: 16    Temp: (!) 97.5 F (36.4 C)    TempSrc: Oral    SpO2: 100%    Weight: 142 lb (64.4 kg)    Height: _0  (1.753 m)     Body mass index is 20.97 kg/m.  General: WDWN male in NAD GAIT:normal HENT: no gross abnormalities  Eyes: PERRLA Pulmonary: Respirations are non-labored, CTAB Cardiac: Regular rhythm and rate,positive murmur.  VASCULAR EXAM Carotid Bruits Left Right   Transmitted cardiac murmur Transmitted cardiac murmur    Abdominal aortic pulse is not palpable. Radial pulses are 2+ palpable and equal.      LE Pulses LEFT RIGHT   POPLITEAL 2+ palpable  2+ palpable   DORSALIS PEDIS  ANTERIOR TIBIAL 2+ palpable  2+ palpable    Gastrointestinal: soft, nontender, BS WNL, no r/g,no palable masses. Musculoskeletal: No muscle atrophy/wasting. M/S 5/5 throughout, Extremities without ischemic changes Skin: No rashes, no ulcers, no cellulitis.   Neurologic:  A&O X 3; appropriate affect, sensation is normal; speech is normal, CN 2-12 intact except is hard of hearing, pain and light touch intact in extremities, motor exam as listed above. Psychiatric: Normal thought content, mood appropriate to clinical situation.    Assessment: Jeffrey [name] is a 77 y.o. male who is s/p left CEA in April 2010. He has no history of stroke of TIA.  His atherosclerotic risk factors include current smoker x 60+ years.  Fortunately he does not have DM.   DATA Carotid Duplex (12/21/17): Right  ICA: 1-39% stenosis Left ICA: CEA site, 1-39% stenosis Right vertebral artery with antegrade flow, left with high resistance flow. Bilateral subclavian artery waveforms are normal.   Decreased category of stenosis in the bilateral ICA, and left vertebral artery with high resistance flow compared to the exam on 03-16-17.   Plan:  The patient was counseled re smoking cessation and given several free resources re smoking cessation.   Follow-up in 1 year with Carotid Duplex scan.  I discussed in depth with the patient the nature of atherosclerosis, and emphasized the importance of maximal medical management including strict control of blood pressure, blood glucose, and lipid levels, obtaining regular exercise, and cessation of smoking.  The patient is aware that without maximal medical management the underlying atherosclerotic disease process will progress, limiting the benefit of any interventions. The patient was given information about stroke prevention and what symptoms should prompt the patient to seek immediate medical care. Thank you for allowing [place] to participate in this patient's care.  Clemon Chambers, RN, MSN, FNP-C [facility] Office: [phone]  Clinic Physician: Oneida Alar  12/21/17 2:18 PM

## 2017-12-24 ENCOUNTER — Ambulatory Visit: Payer: Medicare Other | Admitting: Family Medicine

## 2017-12-24 ENCOUNTER — Encounter: Payer: Self-pay | Admitting: Family Medicine

## 2017-12-24 VITALS — BP 142/76 | HR 56 | Resp 16 | Wt 147.0 lb

## 2017-12-24 DIAGNOSIS — R001 Bradycardia, unspecified: Secondary | ICD-10-CM | POA: Diagnosis not present

## 2017-12-24 DIAGNOSIS — I10 Essential (primary) hypertension: Secondary | ICD-10-CM | POA: Diagnosis not present

## 2017-12-24 NOTE — Patient Instructions (Signed)
Call office with report of your daily blood pressures and heart rates in about 10 days.

## 2017-12-24 NOTE — Progress Notes (Signed)
OFFICE VISIT  12/24/2017   CC:  Chief Complaint  Patient presents with  . Follow-up    RCI   HPI:    Patient is a 77 y.o. Caucasian male who presents for 10d f/u fatigue and bradycardia. I have weened him off his beta blocker.  Last visit I started him on amlodipine 26m  for bP control in place of his toprol.  Feeling MUCH better.  Acting more like himself per wife today. Home bp's over the last 5 d since getting on amlodipine: some normal systolics but also some in 1774J diastolics 628N-86V HR's much improved at 70 avg.  No dizziness, on palpitations,  No CP, no SOB.   Past Medical History:  Diagnosis Date  . Adenomatous colon polyp    Dr. RLaural Golden(28 polyps on 1st endo, 3 on 2nd endo a year later.  8 on colonoscopy 02/26/14--recall 3 yrs.  More on 09/20/17 TCS--recall 3-5 yrs.  . Aortic insufficiency    echo 03/25/12-EF>55%, mild-mod Aortic regurg, sclerotic aortic valve,   . BPH (benign prostatic hyperplasia)    Dr. JLum Babe(pt is asymptomatic)  . Carotid artery occlusion    carotid dopplers 02/06/12-patent left carotid endarterectomy; right internal carotid 40-59%.  Repeat dopplers 03/2017, 40-59% stenosed bilat int carot--per vasc needs rpt 9 mo.  . Chronic renal insufficiency, stage II (mild)    Borderline stage II/III--CrCl @ 60.  No RAS on 2003 angiogram (done for + renal artery dopplers--deemed eventually to have been a false positive).  Dr. BGwenlyn Found  . DDD (degenerative disc disease), lumbar 02/2013   with spondylosis and moderate spinal stenosis+ left L5 and S1 nerve root impingement  . Ectatic abdominal aorta ([place] 08/08/2016   2.8 cm at widest point: repeat aortic u/s 08/2021.  .Marland KitchenEssential tremor   . Fatty liver 2006   Noted on abd u/s and renal u/s 2006/2007  . History of hyperkalemia 02/2017   mild; recommended pt cut his ARB in half.  . History of MI (myocardial infarction)    Large fixed inferior defect on myocardial perfusion imaging 03/2017.  .Marland KitchenHyperlipidemia    Dr. HPercival Spanishstarted statin 03/2017.    .Marland KitchenHypertension   . P[place] artery disease) [place]    carotids: Dr. HPercival Spanishstarted pt on statin for CV risk reduction 03/2017  . Plantar wart of left foot 12/21/2012  . Prediabetes 2019   A1c 5.9%  . SOB (shortness of breath)    MET test 03/25/12-mildly abnormal  . Solitary pulmonary nodule 03/14/2013   762mRUL 03/08/13--stable on repeat 07/2013, 03/2015, and 01/2016.  No additional imaging is required.  . Tobacco dependence    ongoing as of 02/2016  . Vitamin B12 deficiency without anemia 2016   Intrinsic factor NEG: home vit B12 injections started 11/17/14    Past Surgical History:  Procedure Laterality Date  . APPENDECTOMY  remote  . Arm surgery  July 2010   Left arm cyst/Lipoma  . CARDIOVASCULAR STRESS TEST  03/2017   Low risk study (likely artifact seen, but no reversible ischemia, EF 59%, no wall motion abnormality.  . carotid dopplers  02/2015; 02/2016   2017:  R ICA 1-39%, L ICA 40-59%: no change from 2016. 03/2017 rpt 40-59% bilat ICA stenosis--vasc recommended rpt 9 mo.  . Marland KitchenAROTID ENDARTERECTOMY  11/25/08   Left     ICA  . COLONOSCOPY N/A 02/26/2014   Recall 3 yrs: (+ polypectomy--tubular adenoma) Procedure: COLONOSCOPY;  Surgeon: NaRogene HoustonMD;  [facility];  Service: Endoscopy;  Laterality: N/A;  830  . COLONOSCOPY N/A 09/20/2017   Procedure: COLONOSCOPY;  Surgeon: Rogene Houston, MD;  [facility];  Service: Endoscopy;  Laterality: N/A;  730  . COLONOSCOPY W/ POLYPECTOMY  09/20/2017   Adenomatous polyp: recall 3-5 yrs (Dr. Laural Golden)  . POLYPECTOMY  09/20/2017   Procedure: POLYPECTOMY;  Surgeon: Rogene Houston, MD;  [facility];  Service: Endoscopy;;  Ascending colon, Transverse colon, Hepatic flexure, Cecum  . TONSILLECTOMY  remote  . TONSILLECTOMY    . TRANSTHORACIC ECHOCARDIOGRAM  03/2012   2013 --EF 55%, normal LV syst fxn, impaired diast relaxation, mild/mod aortic regurg.  Marland Kitchen UMBILICAL  HERNIA REPAIR      Outpatient Medications Prior to Visit  Medication Sig Dispense Refill  . amLODipine (NORVASC) 10 MG tablet Take 1 tablet (10 mg total) by mouth daily. 30 tablet 1  . aspirin 81 MG tablet Take 1 tablet (81 mg total) by mouth daily. 30 tablet   . atorvastatin (LIPITOR) 20 MG tablet Take 1 tablet (20 mg total) by mouth daily. 30 tablet 2  . gabapentin (NEURONTIN) 300 MG capsule 1 cap po qAM, 1 cap po q afternoon, and 2 caps po qhs (Patient taking differently: Take 300-600 mg by mouth 3 (three) times daily. Take 1 capsule by mouth in the morning and 1 capsule in the afternoon and 2 capsules by mouth at bedtime) 120 capsule 6  . metoprolol succinate (TOPROL-XL) 100 MG 24 hr tablet TAKE 1 TABLET BY MOUTH DAILY WITH OR IMMEDIATELY FOLLOWING A MEAL 90 tablet 3   Facility-Administered Medications Prior to Visit  Medication Dose Route Frequency Provider Last Rate Last Dose  . cyanocobalamin ((VITAMIN B-12)) injection 1,000 mcg  1,000 mcg Intramuscular Q30 days Tammi Sou, MD   1,000 mcg at 12/10/17 4098    Allergies  Allergen Reactions  . Clonidine Derivatives Rash    ROS As per HPI  PE: Blood pressure (!) 142/76, pulse (!) 56, resp. rate 16, weight 147 lb (66.7 kg), SpO2 99 %. Gen: Alert, well appearing.  Patient is oriented to person, place, time, and situation. AFFECT: pleasant, lucid thought and speech. CV: RRR, 1/6 syst murmur, no r/g.  No diastolic murmur. Chest is clear, no wheezing or rales. Normal symmetric air entry throughout both lung fields. No chest wall deformities or tenderness. EXT: no clubbing, cyanosis, or edema.    LABS:    Chemistry      Component Value Date/Time   NA 138 12/10/2017 0855   K 4.5 12/10/2017 0855   CL 103 12/10/2017 0855   CO2 30 12/10/2017 0855   BUN 14 12/10/2017 0855   CREATININE 1.28 12/10/2017 0855   CREATININE 1.19 07/04/2013 1115      Component Value Date/Time   CALCIUM 9.6 12/10/2017 0855   ALKPHOS 60  12/10/2017 0855   AST 10 12/10/2017 0855   ALT 6 12/10/2017 0855   BILITOT 0.6 12/10/2017 0855     Lab Results  Component Value Date   TSH 3.96 12/10/2017   Lab Results  Component Value Date   [id number] 621 05/15/2017    IMPRESSION AND PLAN:  1) Fatigue: symptomatic bradycardia---nearly completely resolved now. Stay off beta blocker.  2) HTN: some normal and some mild syst elevations since starting amlodipine 5 d/a. We'll give this a bit more time before adding further bp med. Instructions: Call office with report of your daily blood pressures and heart rates in about 10 days.  An After Visit Summary was printed and given to the patient.  FOLLOW UP: Return for keep appt scheduled in July with me.   Signed:  Crissie Sickles, MD           12/24/2017

## 2018-01-02 ENCOUNTER — Encounter: Payer: Self-pay | Admitting: Family Medicine

## 2018-01-03 ENCOUNTER — Telehealth: Payer: Self-pay | Admitting: Family Medicine

## 2018-01-03 MED ORDER — HYDROCHLOROTHIAZIDE 12.5 MG PO CAPS: 12.5000 mg | ORAL_CAPSULE | Freq: Every day | ORAL | 2 refills | Status: DC

## 2018-01-03 NOTE — Telephone Encounter (Signed)
(  AVG bp 140/70, HR avg 60.  Pt with hx of CRI w/GFR about 60 + hx of hyperkalemia, so ARB or ACE-I not ideal choice of add on bp med).  Continue amlodipine 10mg  once daily. I want to add hctz 12.5mg  once daily--I'll eRx to [facility] on [address] in [place]..  Call with bp report again in 10-14d.-thx

## 2018-01-03 NOTE — Telephone Encounter (Signed)
Patient & wife notified and verbalized understanding.  ° °

## 2018-01-03 NOTE — Telephone Encounter (Signed)
Copied from [place] [id number]. Topic: Quick Communication - See Telephone Encounter >> Jan 03, 2018 10:54 AM Bea Graff, NT wrote: CRM for notification. See Telephone encounter for: 01/03/18. Pts wife calling with pts 10 day blood pressure readings. The readings were:  4/16: 131/80 pulse 76 10 mins after: 127/71 pulse: 62      4/17: 151/80 pulse 59 10 mins after: 137/79 pulse 58 4/18: 148/77 pulse 73 10 mins later: 138/78 pulse 59     4/19: 163/79 pulse: 52 10 mins later: 156/78 pulse: 59       4/20: 155/70 pulse: 62 10 mins later: 126/66 pulse: 59      4/21: 141/80 pulse: 79 10 mins later: 126/76 pulse: 79     4/22: 140/78 pulse: 73 10 mins later: 138/78 pulse: 58      4/23: 152/73 pulse: 57 10 mins later: 139/72 pulse: 60     4/24: 138/62 pulse: 62 10 mins later: 121/68 pulse: 62     4/25: 152/69 pulse: 63 10 mins later: 139/65 pulse: 58

## 2018-01-09 ENCOUNTER — Ambulatory Visit (INDEPENDENT_AMBULATORY_CARE_PROVIDER_SITE_OTHER): Payer: Medicare Other | Admitting: Family Medicine

## 2018-01-09 DIAGNOSIS — E538 Deficiency of other specified B group vitamins: Secondary | ICD-10-CM | POA: Diagnosis not present

## 2018-01-09 NOTE — Progress Notes (Signed)
Patient presented for B12 injection, tolerated well.

## 2018-01-10 NOTE — Progress Notes (Signed)
Pt with vit B12 deficiency.  Agree with vit B12 1000 mcg IM in office today. Signed:  Crissie Sickles, MD           01/10/2018

## 2018-01-18 NOTE — Telephone Encounter (Signed)
Copied from [place] [id number]. Topic: General - Other >> Jan 18, 2018 10:38 AM Lennox Solders wrote: Reason for CRM: pt wife is calling back with her husband bp and pulse  readings for last 15 days. 4-26 bp 121/74 pulse 86, then 10 min later 125/69 and pulse 82. 4-27 bp 138/78 pulse 72 and then 10 min later bp 141/68 pulse 68. 4-28 bp 149/74 pulse 67 and then 10 min later bp 143/78 pulse 66. 4-29 bp 142/72 pulse 72 then 10 min later bp 129/69 pulse 71. 4-30 bp 119/71 pulse 75 then 10 min bp 123/71 pulse 74. May 1 bp 121/77 pulse 75 then 10 min later bp 121/69 pulse 68. May 2 bp 129/75 pulse 86 then 10  min later bp 136/75 pulse 83. May 3 bp 142/65 pulse 55 then 10 min later bp 152/70 pulse 60. May 4 bp 144/70 pulse 60 then 10  min later bp 131-74 pulse 65. May 5 bp 137/73 pulse 67 then 10 min later bp 131/64 pulse 65 .  May 6 bp 177/75 pulse 80 then 10 min later bp 139/73., pulse 75. May 7 bp 130/65 pulse 69 then 10 min later bp 138/72 pulse 75. May 8 bp 143/66 pulse 57 then 10  min later bp 130/63 , pulse 61. May 9 bp 127/74 ,pulse 73 then 10 min later bp 127/77 pulse 75. May 10 bp 130/71 pulse 72 then 10 min later bp 124/68 pulse 71

## 2018-01-18 NOTE — Telephone Encounter (Signed)
These blood pressures and heart rates are great. No new recommendations.-thx

## 2018-01-18 NOTE — Telephone Encounter (Signed)
Pts wife advised and voiced understanding, okay per DPR. 

## 2018-02-11 ENCOUNTER — Ambulatory Visit (INDEPENDENT_AMBULATORY_CARE_PROVIDER_SITE_OTHER): Payer: Medicare Other

## 2018-02-11 DIAGNOSIS — E538 Deficiency of other specified B group vitamins: Secondary | ICD-10-CM | POA: Diagnosis not present

## 2018-02-11 NOTE — Progress Notes (Signed)
Patient presents today for Vitamin B12 injection. 1 ml, Left deltoid. Given IM with no incidence or problems. Patient left with no complaints.

## 2018-02-14 NOTE — Progress Notes (Signed)
Pt with vit B12 deficiency.  Agree with vit B12 1000 mcg IM in office today. Signed:  Crissie Sickles, MD           02/14/2018

## 2018-02-18 ENCOUNTER — Ambulatory Visit: Payer: Medicare Other | Admitting: Family Medicine

## 2018-03-18 ENCOUNTER — Ambulatory Visit: Payer: Medicare Other | Admitting: Family Medicine

## 2018-03-22 ENCOUNTER — Ambulatory Visit: Payer: Medicare Other | Admitting: Family Medicine

## 2018-03-22 NOTE — Progress Notes (Signed)
Subjective:   Jeffrey [name] is a 77 y.o. male who presents for Medicare Annual/Subsequent preventive examination.  Review of Systems:  No ROS.  Medicare Wellness Visit. Additional risk factors are reflected in the social history.    Sleep patterns: Sleeps 6-7 hours.  Home Safety/Smoke Alarms: Feels safe in home. Smoke alarms in place.  Living environment; residence and Firearm Safety: Lives with wife in 1 story home. Daughter lives next door.  Seat Belt Safety/Bike Helmet: Wears seat belt.    Male:   CCS-Colonoscopy 09/20/2017, polyp. Recall 1 year.     PSA-  Lab Results  Component Value Date   PSA 0.85 12/23/2012       Objective:    Vitals: There were no vitals taken for this visit.  There is no height or weight on file to calculate BMI.  Advanced Directives 12/21/2017 09/20/2017 03/07/2016 03/03/2015 02/26/2014 01/07/2014  Does Patient Have a Medical Advance Directive? No Yes No No Patient does not have advance directive;Patient would not like information Patient would like information  Type of Advance Directive - [place];Living will - - - -  Copy of [place] in Chart? - No - copy requested - - - -  Would patient like information on creating a medical advance directive? Yes (MAU/Ambulatory/Procedural Areas - Information given) - No - patient declined information Yes - Educational materials given - -  Pre-existing out of facility DNR order (yellow form or pink MOST form) - - - - No -    Tobacco Social History   Tobacco Use  Smoking Status Current Every Day Smoker  . Packs/day: 1.00  . Years: 60.00  . Pack years: 60.00  . Types: Cigarettes  Smokeless Tobacco Never Used     Ready to quit: Not Answered Counseling given: Not Answered    Past Medical History:  Diagnosis Date  . Adenomatous colon polyp    Dr. Laural Golden (28 polyps on 1st endo, 3 on 2nd endo a year later.  8 on colonoscopy 02/26/14--recall 3 yrs.  Multiple adenomatous  polyps on 09/20/17 TCS--recall 1 yr.  . Aortic insufficiency    echo 03/25/12-EF>55%, mild-mod Aortic regurg, sclerotic aortic valve,   . BPH (benign prostatic hyperplasia)    Dr. Lum Babe (pt is asymptomatic)  . Carotid artery occlusion    carotid dopplers 02/06/12-patent left carotid endarterectomy; right internal carotid 40-59%.  Repeat dopplers 03/2017, 40-59% stenosed bilat int carot--per vasc needs rpt 9 mo.  . Chronic renal insufficiency, stage II (mild)    Borderline stage II/III--CrCl @ 60.  No RAS on 2003 angiogram (done for + renal artery dopplers--deemed eventually to have been a false positive).  Dr. Gwenlyn Found.  . DDD (degenerative disc disease), lumbar 02/2013   with spondylosis and moderate spinal stenosis+ left L5 and S1 nerve root impingement  . Ectatic abdominal aorta ([place]) 08/08/2016   2.8 cm at widest point: repeat aortic u/s 08/2021.  Marland Kitchen Essential tremor   . Fatty liver 2006   Noted on abd u/s and renal u/s 2006/2007  . History of hyperkalemia 02/2017   mild; recommended pt cut his ARB in half.  . History of MI (myocardial infarction)    Large fixed inferior defect on myocardial perfusion imaging 03/2017.  Marland Kitchen Hyperlipidemia    Dr. Percival Spanish started statin 03/2017.    Marland Kitchen Hypertension   . PAD (peripheral artery disease) ([place])    carotids: Dr. Percival Spanish started pt on statin for CV risk reduction 03/2017  . Plantar  wart of left foot 12/21/2012  . Prediabetes 2019   A1c 5.9%  . SOB (shortness of breath)    MET test 03/25/12-mildly abnormal  . Solitary pulmonary nodule 03/14/2013   75m RUL 03/08/13--stable on repeat 07/2013, 03/2015, and 01/2016.  No additional imaging is required.  . Tobacco dependence    ongoing as of 02/2016  . Vitamin B12 deficiency without anemia 2016   Intrinsic factor NEG: home vit B12 injections started 11/17/14   Past Surgical History:  Procedure Laterality Date  . APPENDECTOMY  remote  . Arm surgery  July 2010   Left arm cyst/Lipoma  . CARDIOVASCULAR STRESS  TEST  03/2017   Low risk study (likely artifact seen, but no reversible ischemia, EF 59%, no wall motion abnormality.  . carotid dopplers  02/2015; 02/2016;12/2017   2017:  R ICA 1-39%, L ICA 40-59%: no change from 2016. 03/2017 rpt 40-59% bilat ICA stenosis--vasc recommended rpt 9 mo.  12/2017 1-39% bilat ICA stenosis.  Repeat 1 yr.  .Marland KitchenCAROTID ENDARTERECTOMY  11/25/08   Left     ICA  . COLONOSCOPY N/A 02/26/2014   Recall 3 yrs: (+ polypectomy--tubular adenoma) Procedure: COLONOSCOPY;  Surgeon: NRogene Houston MD;  [facility];  Service: Endoscopy;  Laterality: N/A;  830  . COLONOSCOPY N/A 09/20/2017   Multiple adenomatous polyps: recall 1 yr.  Procedure: COLONOSCOPY;  Surgeon: RRogene Houston MD;  [facility];  Service: Endoscopy;  Laterality: N/A;  730  . COLONOSCOPY W/ POLYPECTOMY  09/20/2017   Adenomatous polyp: recall 3-5 yrs (Dr. RLaural Golden  . POLYPECTOMY  09/20/2017   Procedure: POLYPECTOMY;  Surgeon: RRogene Houston MD;  [facility];  Service: Endoscopy;;  Ascending colon, Transverse colon, Hepatic flexure, Cecum  . TONSILLECTOMY  remote  . TONSILLECTOMY    . TRANSTHORACIC ECHOCARDIOGRAM  03/2012   2013 --EF 55%, normal LV syst fxn, impaired diast relaxation, mild/mod aortic regurg.  .Marland KitchenUMBILICAL HERNIA REPAIR     Family History  Problem Relation Age of Onset  . Coronary artery disease Mother   . Stroke Mother   . Diabetes Mother   . Hypertension Mother   . Parkinson's disease Mother   . Colon cancer Neg Hx    Social History   Socioeconomic History  . Marital status: Married    Spouse name: Not on file  . Number of children: Not on file  . Years of education: Not on file  . Highest education level: Not on file  Occupational History  . Not on file  Social Needs  . Financial resource strain: Not on file  . Food insecurity:    Worry: Not on file    Inability: Not on file  . Transportation needs:    Medical: Not on file    Non-medical: Not  on file  Tobacco Use  . Smoking status: Current Every Day Smoker    Packs/day: 1.00    Years: 60.00    Pack years: 60.00    Types: Cigarettes  . Smokeless tobacco: Never Used  Substance and Sexual Activity  . Alcohol use: No    Alcohol/week: 0.0 oz  . Drug use: No  . Sexual activity: Not on file  Lifestyle  . Physical activity:    Days per week: Not on file    Minutes per session: Not on file  . Stress: Not on file  Relationships  . Social connections:    Talks on phone: Not on file  Gets together: Not on file    Attends religious service: Not on file    Active member of club or organization: Not on file    Attends meetings of clubs or organizations: Not on file    Relationship status: Not on file  Other Topics Concern  . Not on file  Social History Narrative   Married, 2 children.   Orig from [place].   Retired from [employer].   Current smoker; 1 ppd (x 50 yrs).  No hx alc, no drugs.   Active person, no formal exercise.    Outpatient Encounter Medications as of 03/25/2018  Medication Sig  . amLODipine (NORVASC) 10 MG tablet Take 1 tablet (10 mg total) by mouth daily.  Marland Kitchen aspirin 81 MG tablet Take 1 tablet (81 mg total) by mouth daily.  Marland Kitchen atorvastatin (LIPITOR) 20 MG tablet Take 1 tablet (20 mg total) by mouth daily.  Marland Kitchen gabapentin (NEURONTIN) 300 MG capsule 1 cap po qAM, 1 cap po q afternoon, and 2 caps po qhs (Patient taking differently: Take 300-600 mg by mouth 3 (three) times daily. Take 1 capsule by mouth in the morning and 1 capsule in the afternoon and 2 capsules by mouth at bedtime)  . hydrochlorothiazide (MICROZIDE) 12.5 MG capsule Take 1 capsule (12.5 mg total) by mouth daily.   Facility-Administered Encounter Medications as of 03/25/2018  Medication  . cyanocobalamin ((VITAMIN B-12)) injection 1,000 mcg    Activities of Daily Living No flowsheet data found.  Patient Care Team: Tammi Sou, MD as PCP - General (Family Medicine) Rogene Houston, MD as Consulting Physician (Gastroenterology) Nickel, Sharmon Leyden, NP as Nurse Practitioner (Vascular Surgery) Minus Breeding, MD as Consulting Physician (Cardiology)   Assessment:   This is a routine wellness examination for Ragan.  Exercise Activities and Dietary recommendations   Diet (meal preparation, eat out, water intake, caffeinated beverages, dairy products, fruits and vegetables): Drinks coffee and soda.   Breakfast: Coffee, biscuit Lunch: Nabs Dinner: protein and vegetables     Goals    None      Fall Risk Fall Risk  09/14/2017 07/24/2016 07/24/2016 01/04/2016  Falls in the past year? No No No No    Depression Screen PHQ 2/9 Scores 09/14/2017 07/24/2016 07/24/2016 01/04/2016  PHQ - 2 Score 1 0 0 0    Cognitive Function        Immunization History  Administered Date(s) Administered  . Influenza Split 06/11/2012  . Influenza, High Dose Seasonal PF 06/21/2016, 06/15/2017  . Influenza,inj,Quad PF,6+ Mos 05/23/2013, 07/08/2014, 06/17/2015  . Pneumococcal Conjugate-13 01/04/2016  . Pneumococcal Polysaccharide-23 09/12/2011    Screening Tests Health Maintenance  Topic Date Due  . TETANUS/TDAP  04/08/1962  . INFLUENZA VACCINE  04/11/2018  . COLONOSCOPY  09/21/2027  . PNA vac Low Risk Adult  Completed        Plan:    Bring a copy of your living will and/or healthcare power of attorney to your next office visit.  Continue doing brain stimulating activities (puzzles, reading, adult coloring books, staying active) to keep memory sharp.   I have personally reviewed and noted the following in the patient's chart:   . Medical and social history . Use of alcohol, tobacco or illicit drugs  . Current medications and supplements . Functional ability and status . Nutritional status . Physical activity . Advanced directives . List of other physicians . Hospitalizations, surgeries, and ER visits in previous 12 months . Vitals . Screenings to include  cognitive, depression, and falls . Referrals and appointments  In addition, I have reviewed and discussed with patient certain preventive protocols, quality metrics, and best practice recommendations. A written personalized care plan for preventive services as well as general preventive health recommendations were provided to patient.     Gerilyn Nestle, RN  03/22/2018

## 2018-03-25 ENCOUNTER — Encounter: Payer: Self-pay | Admitting: Family Medicine

## 2018-03-25 ENCOUNTER — Ambulatory Visit: Payer: Medicare Other | Admitting: Family Medicine

## 2018-03-25 ENCOUNTER — Other Ambulatory Visit: Payer: Self-pay

## 2018-03-25 ENCOUNTER — Ambulatory Visit (INDEPENDENT_AMBULATORY_CARE_PROVIDER_SITE_OTHER): Payer: Medicare Other

## 2018-03-25 VITALS — BP 142/69 | HR 63 | Temp 98.3°F | Resp 16 | Ht 69.0 in | Wt 140.5 lb

## 2018-03-25 DIAGNOSIS — Z0000 Encounter for general adult medical examination without abnormal findings: Secondary | ICD-10-CM | POA: Diagnosis not present

## 2018-03-25 DIAGNOSIS — F17200 Nicotine dependence, unspecified, uncomplicated: Secondary | ICD-10-CM | POA: Diagnosis not present

## 2018-03-25 DIAGNOSIS — N183 Chronic kidney disease, stage 3 unspecified: Secondary | ICD-10-CM

## 2018-03-25 DIAGNOSIS — I10 Essential (primary) hypertension: Secondary | ICD-10-CM | POA: Diagnosis not present

## 2018-03-25 DIAGNOSIS — E538 Deficiency of other specified B group vitamins: Secondary | ICD-10-CM

## 2018-03-25 DIAGNOSIS — E7800 Pure hypercholesterolemia, unspecified: Secondary | ICD-10-CM | POA: Diagnosis not present

## 2018-03-25 MED ORDER — CYANOCOBALAMIN 1000 MCG/ML IJ SOLN: 1000.0000 ug | Freq: Once | INTRAMUSCULAR | Status: DC

## 2018-03-25 NOTE — Progress Notes (Signed)
AWV reviewed and agree. Signed:  Crissie Sickles, MD           03/25/2018

## 2018-03-25 NOTE — Patient Instructions (Addendum)
Check blood pressure and heart rate once a day and write these numbers down. Call our office to report these numbers in 10-14 days.  Currently you take hctz for blood pressure.  You used to take amlodipine--->if your blood pressure numbers are not well controlled then I will add this medication back on your daily regimen.

## 2018-03-25 NOTE — Patient Instructions (Addendum)
Bring a copy of your living will and/or healthcare power of attorney to your next office visit.  Continue doing brain stimulating activities (puzzles, reading, adult coloring books, staying active) to keep memory sharp.    Health Maintenance, Male A healthy lifestyle and preventive care is important for your health and wellness. Ask your health care provider about what schedule of regular examinations is right for you. What should I know about weight and diet? Eat a Healthy Diet  Eat plenty of vegetables, fruits, whole grains, low-fat dairy products, and lean protein.  Do not eat a lot of foods high in solid fats, added sugars, or salt.  Maintain a Healthy Weight Regular exercise can help you achieve or maintain a healthy weight. You should:  Do at least 150 minutes of exercise each week. The exercise should increase your heart rate and make you sweat (moderate-intensity exercise).  Do strength-training exercises at least twice a week.  Watch Your Levels of Cholesterol and Blood Lipids  Have your blood tested for lipids and cholesterol every 5 years starting at 75 years of age. If you are at high risk for heart disease, you should start having your blood tested when you are 75 years old. You may need to have your cholesterol levels checked more often if: ? Your lipid or cholesterol levels are high. ? You are older than 75 years of age. ? You are at high risk for heart disease.  What should I know about cancer screening? Many types of cancers can be detected early and may often be prevented. Lung Cancer  You should be screened every year for lung cancer if: ? You are a current smoker who has smoked for at least 30 years. ? You are a former smoker who has quit within the past 15 years.  Talk to your health care provider about your screening options, when you should start screening, and how often you should be screened.  Colorectal Cancer  Routine colorectal cancer screening  usually begins at 75 years of age and should be repeated every 5-10 years until you are 75 years old. You may need to be screened more often if early forms of precancerous polyps or small growths are found. Your health care provider may recommend screening at an earlier age if you have risk factors for colon cancer.  Your health care provider may recommend using home test kits to check for hidden blood in the stool.  A small camera at the end of a tube can be used to examine your colon (sigmoidoscopy or colonoscopy). This checks for the earliest forms of colorectal cancer.  Prostate and Testicular Cancer  Depending on your age and overall health, your health care provider may do certain tests to screen for prostate and testicular cancer.  Talk to your health care provider about any symptoms or concerns you have about testicular or prostate cancer.  Skin Cancer  Check your skin from head to toe regularly.  Tell your health care provider about any new moles or changes in moles, especially if: ? There is a change in a mole's size, shape, or color. ? You have a mole that is larger than a pencil eraser.  Always use sunscreen. Apply sunscreen liberally and repeat throughout the day.  Protect yourself by wearing long sleeves, pants, a wide-brimmed hat, and sunglasses when outside.  What should I know about heart disease, diabetes, and high blood pressure?  If you are 18-39 years of age, have your blood pressure checked every   3-5 years. If you are 40 years of age or older, have your blood pressure checked every year. You should have your blood pressure measured twice-once when you are at a hospital or clinic, and once when you are not at a hospital or clinic. Record the average of the two measurements. To check your blood pressure when you are not at a hospital or clinic, you can use: ? An automated blood pressure machine at a pharmacy. ? A home blood pressure monitor.  Talk to your health care  provider about your target blood pressure.  If you are between 45-79 years old, ask your health care provider if you should take aspirin to prevent heart disease.  Have regular diabetes screenings by checking your fasting blood sugar level. ? If you are at a normal weight and have a low risk for diabetes, have this test once every three years after the age of 45. ? If you are overweight and have a high risk for diabetes, consider being tested at a younger age or more often.  A one-time screening for abdominal aortic aneurysm (AAA) by ultrasound is recommended for men aged 65-75 years who are current or former smokers. What should I know about preventing infection? Hepatitis B If you have a higher risk for hepatitis B, you should be screened for this virus. Talk with your health care provider to find out if you are at risk for hepatitis B infection. Hepatitis C Blood testing is recommended for:  Everyone born from 1945 through 1965.  Anyone with known risk factors for hepatitis C.  Sexually Transmitted Diseases (STDs)  You should be screened each year for STDs including gonorrhea and chlamydia if: ? You are sexually active and are younger than 75 years of age. ? You are older than 75 years of age and your health care provider tells you that you are at risk for this type of infection. ? Your sexual activity has changed since you were last screened and you are at an increased risk for chlamydia or gonorrhea. Ask your health care provider if you are at risk.  Talk with your health care provider about whether you are at high risk of being infected with HIV. Your health care provider may recommend a prescription medicine to help prevent HIV infection.  What else can I do?  Schedule regular health, dental, and eye exams.  Stay current with your vaccines (immunizations).  Do not use any tobacco products, such as cigarettes, chewing tobacco, and e-cigarettes. If you need help quitting, ask  your health care provider.  Limit alcohol intake to no more than 2 drinks per day. One drink equals 12 ounces of beer, 5 ounces of wine, or 1 ounces of hard liquor.  Do not use street drugs.  Do not share needles.  Ask your health care provider for help if you need support or information about quitting drugs.  Tell your health care provider if you often feel depressed.  Tell your health care provider if you have ever been abused or do not feel safe at home. This information is not intended to replace advice given to you by your health care provider. Make sure you discuss any questions you have with your health care provider. Document Released: 02/24/2008 Document Revised: 04/26/2016 Document Reviewed: 06/01/2015 Elsevier Interactive Patient Education  2018 Elsevier Inc.  

## 2018-03-25 NOTE — Progress Notes (Signed)
OFFICE VISIT  03/25/2018   CC:  Chief Complaint  Patient presents with  . Follow-up    RCI, pt is not fasting.      HPI:    Patient is a 77 y.o. Caucasian male who presents for f/u HTN, HLD, CRI II/III with GFR about 69m/min, tob dependence. Three months ago he was having signif fatigue so I d/c'd his beta blocker and he felt much better.    HTN: bp monitoring at home---not anymore.  It app  He is feeling well.  Taking statin w/out side effect and taking hctz.  Has not taken amlodipine in a few months---seems to have been some confusion about this med.  Still smoking some: wants to quit but he turns to a cigarette every time he gets bored.  He is not too interested in quitting at this time.   ROS: no wheezing, no sOB, no CP, no LE swelling, no dizziness, no HAs, no polyuria or polydipsia.  Past Medical History:  Diagnosis Date  . Adenomatous colon polyp    Dr. RLaural Golden(28 polyps on 1st endo, 3 on 2nd endo a year later.  8 on colonoscopy 02/26/14--recall 3 yrs.  Multiple adenomatous polyps on 09/20/17 TCS--recall 1 yr.  . Aortic insufficiency    echo 03/25/12-EF>55%, mild-mod Aortic regurg, sclerotic aortic valve,   . BPH (benign prostatic hyperplasia)    Dr. JLum Babe(pt is asymptomatic)  . Carotid artery occlusion    carotid dopplers 02/06/12-patent left carotid endarterectomy; right internal carotid 40-59%.  Repeat dopplers 03/2017, 40-59% stenosed bilat int carot--per vasc needs rpt 9 mo.  . Chronic renal insufficiency, stage II (mild)    Borderline stage II/III--CrCl @ 60.  No RAS on 2003 angiogram (done for + renal artery dopplers--deemed eventually to have been a false positive).  Dr. BGwenlyn Found  . DDD (degenerative disc disease), lumbar 02/2013   with spondylosis and moderate spinal stenosis+ left L5 and S1 nerve root impingement  . Ectatic abdominal aorta ([place] 08/08[place]   2.8 cm at widest point: repeat aortic u/s 08/2021.  .Marland KitchenEssential tremor   . Fatty liver 2006   Noted on  abd u/s and renal u/s 2006/2007  . History of hyperkalemia 02/2017   mild; recommended pt cut his ARB in half.  . History of MI (myocardial infarction)    Large fixed inferior defect on myocardial perfusion imaging 03/2017.  .Marland KitchenHyperlipidemia    Dr. HPercival Spanishstarted statin 03/2017.    .Marland KitchenHypertension   . PAD (peripheral artery disease) ([facility] [place]    carotids: Dr. HPercival Spanishstarted pt on statin for CV risk reduction 03/2017  . Plantar wart of left foot 12/21/2012  . Prediabetes 2019   A1c 5.9%  . SOB (shortness of breath)    MET test 03/25/12-mildly abnormal  . Solitary pulmonary nodule 03/14/2013   71mRUL 03/08/13--stable on repeat 07/2013, 03/2015, and 01/2016.  No additional imaging is required.  . Tobacco dependence    ongoing as of 02/2016  . Vitamin B12 deficiency without anemia 2016   Intrinsic factor NEG: home vit B12 injections started 11/17/14    Past Surgical History:  Procedure Laterality Date  . APPENDECTOMY  remote  . Arm surgery  July 2010   Left arm cyst/Lipoma  . CARDIOVASCULAR STRESS TEST  03/2017   Low risk study (likely artifact seen, but no reversible ischemia, EF 59%, no wall motion abnormality.  . carotid dopplers  02/2015; 02/2016;12/2017   2017:  R ICA 1-39%, L ICA 40-59%: no change  from 2016. 03/2017 rpt 40-59% bilat ICA stenosis--vasc recommended rpt 9 mo.  12/2017 1-39% bilat ICA stenosis.  Repeat 1 yr.  Marland Kitchen CAROTID ENDARTERECTOMY  11/25/08   Left     ICA  . COLONOSCOPY N/A 02/26/2014   Recall 3 yrs: (+ polypectomy--tubular adenoma) Procedure: COLONOSCOPY;  Surgeon: Rogene Houston, MD;  [facility];  Service: Endoscopy;  Laterality: N/A;  830  . COLONOSCOPY N/A 09/20/2017   Multiple adenomatous polyps: recall 1 yr.  Procedure: COLONOSCOPY;  Surgeon: Rogene Houston, MD;  Location: [facility];  Service: Endoscopy;  Laterality: N/A;  730  . COLONOSCOPY W/ POLYPECTOMY  09/20/2017   Adenomatous polyp: recall 3-5 yrs (Dr. Laural Golden)  . POLYPECTOMY  09/20/2017    Procedure: POLYPECTOMY;  Surgeon: Rogene Houston, MD;  Location: [facility];  Service: Endoscopy;;  Ascending colon, Transverse colon, Hepatic flexure, Cecum  . TONSILLECTOMY  remote  . TONSILLECTOMY    . TRANSTHORACIC ECHOCARDIOGRAM  03/2012   2013 --EF 55%, normal LV syst fxn, impaired diast relaxation, mild/mod aortic regurg.  Marland Kitchen UMBILICAL HERNIA REPAIR      Outpatient Medications Prior to Visit  Medication Sig Dispense Refill  . aspirin 81 MG tablet Take 1 tablet (81 mg total) by mouth daily. 30 tablet   . atorvastatin (LIPITOR) 20 MG tablet Take 1 tablet (20 mg total) by mouth daily. 30 tablet 2  . gabapentin (NEURONTIN) 300 MG capsule 1 cap po qAM, 1 cap po q afternoon, and 2 caps po qhs (Patient not taking: Reported on 03/25/2018) 120 capsule 6  . amLODipine (NORVASC) 10 MG tablet Take 1 tablet (10 mg total) by mouth daily. 30 tablet 1  . hydrochlorothiazide (MICROZIDE) 12.5 MG capsule Take 1 capsule (12.5 mg total) by mouth daily. 30 capsule 2   Facility-Administered Medications Prior to Visit  Medication Dose Route Frequency Provider Last Rate Last Dose  . cyanocobalamin ((VITAMIN B-12)) injection 1,000 mcg  1,000 mcg Intramuscular Q30 days Tammi Sou, MD   1,000 mcg at 03/25/18 1455    Allergies  Allergen Reactions  . Clonidine Derivatives Rash    ROS As per HPI  PE: Blood pressure (!) 142/69, pulse 63, temperature 98.3 F (36.8 C), temperature source Oral, resp. rate 16, height _0  (1.753 m), weight 140 lb 8 oz (63.7 kg), SpO2 100 %. Gen: Alert, well appearing.  Patient is oriented to person, place, time, and situation. AFFECT: pleasant, lucid thought and speech. CV: soft systolic murmur heard best at cardiac base, no diastolic murmur.  No r/g. EXT: no clubbing, cyanosis, or edema.    LABS:   Lab Results  Component Value Date   EFEOFHQR97 588 05/15/2017    Lab Results  Component Value Date   TSH 3.96 12/10/2017   Lab Results  Component Value  Date   WBC 7.5 12/10/2017   HGB 14.6 12/10/2017   HCT 43.6 12/10/2017   MCV 88.0 12/10/2017   PLT 232.0 12/10/2017   Lab Results  Component Value Date   CREATININE 1.28 12/10/2017   BUN 14 12/10/2017   NA 138 12/10/2017   K 4.5 12/10/2017   CL 103 12/10/2017   CO2 30 12/10/2017   Lab Results  Component Value Date   ALT 6 12/10/2017   AST 10 12/10/2017   ALKPHOS 60 12/10/2017   BILITOT 0.6 12/10/2017   Lab Results  Component Value Date   CHOL 198 09/14/2017   Lab Results  Component Value Date   HDL  46.50 09/14/2017   Lab Results  Component Value Date   LDLCALC 133 (H) 09/14/2017   Lab Results  Component Value Date   TRIG 94.0 09/14/2017   Lab Results  Component Value Date   CHOLHDL 4 09/14/2017   Lab Results  Component Value Date   PSA 0.85 12/23/2012   Lab Results  Component Value Date   HGBA1C 5.9 12/10/2017    IMPRESSION AND PLAN:  1) HTN: control ok but we need more home monitoring data since his med regimen is currently different than I have documented in past notes.  He currently takes hctz 69m qd.  Records indicate he should be on amlodipine 119mqd (but not hctz).  Insructions: Check blood pressure and heart rate once a day and write these numbers down. Call our office to report these numbers in 10-14 days.  Currently you take hctz for blood pressure.  You used to take amlodipine--->if your blood pressure numbers are not well controlled then I will add this medication back on your daily regimen.  2) HLD: he is overdue for recheck FLP since 09/2017 when I increased his statin dose.  Future lab ordered--fasting.  3) CRI II/III: pt avoids NSAIDS and is always trying to work harder on good fluid intake.  4) Tobacco dependence: he has failed many quit attempts, most recently with assistance of Chantix. He is not sufficiently motivated to try to quit again at this time. I encouraged him to quit completely today.  An After Visit Summary was printed  and given to the patient.  FOLLOW UP: Return in about 6 months (around 09/25/2018) for annual CPE (fasting).  He'll get fasting lipid panel when he comes in for his next B12 shot in 1 mo.  Signed:  PhCrissie SicklesMD           03/25/2018

## 2018-03-26 ENCOUNTER — Other Ambulatory Visit: Payer: Self-pay | Admitting: Family Medicine

## 2018-03-26 ENCOUNTER — Telehealth: Payer: Self-pay

## 2018-03-26 NOTE — Telephone Encounter (Signed)
Tried calling pt, NA and unable to leave a message due to vm box not being set up yet. 

## 2018-03-26 NOTE — Telephone Encounter (Signed)
Noted. Agree.

## 2018-03-26 NOTE — Telephone Encounter (Signed)
[place] Night - Client [place] Patient Name: Jeffrey [name] Gender: Male DOB: 1941/04/12  Age: 77 Y 11 M 17 D Return Phone Number: [phone] (Primary) Address:  City/State/Zip: [place]  [id number] Client [facility] Night - Client Client Site [facility] Night Physician Crissie Sickles - MD Contact Type Call Who Is Calling Patient / Member / Family / Caregiver Call Type Triage / Clinical Caller Name Jeffrey [name] Relationship To Patient Grandchild Return Phone Number [phone] (Primary) Chief Complaint Health information question (non symptomatic) Reason for Call Symptomatic / Request for [place] states they need to understand some information on their grandfathers chart. The chart says chronic renal insufficient and stage three minor HCC No symptoms reported. Translation No No Triage Reason Other Nurse Assessment Nurse: Sherren Mocha, RN, Pam Date/Time Eilene Ghazi Time): 03/25/2018 8:21:00 PM Confirm and document reason for call. If symptomatic, describe symptoms. ---Caller states he if grandson and was wanting to information about his grandfathers conditions. Does the patient have any new or worsening symptoms? ---Yes Will a triage be completed? ---No Select reason for no triage. ---Other Please document clinical information provided and list any resource used. ---Advised I could not discuss patients conditions that he read on his grandfathers My Chart. Advised he needed to have his grandfather call the office regarding any information needed. Guidelines Guideline Title Affirmed Question Affirmed Notes Nurse Date/Time (Eastern Time) Disp. Time Eilene Ghazi Time) Disposition Final User 03/25/2018 8:00:15 PM Attempt made - message left Sherren Mocha RN, Pam 03/25/2018 8:25:19 PM Clinical Call Yes Sherren Mocha, RN, Pam

## 2018-03-26 NOTE — Telephone Encounter (Signed)
[facility]  RF request for gabapentin LOV: 03/25/18 Next ov: 09/26/18 Last written: 03/23/16 #120 w/ 6RF  Please advise. Thanks.

## 2018-03-26 NOTE — Telephone Encounter (Signed)
Called different number in chart and spoke with pts wife, okay per DPR.  She was concerned about the CRI, stage 3 Dx on pts AVS. I advised her that this is a Dx used to document pts kidney health and that everyone is in some stage of kidney disease, nothing to be concerned with at this time. I advised her that if there was need for concern we would let them know, for know everything is stable and we are just monitoring his kidney functions. I advised her that pt should continue to drink plenty of clear fluids, she voiced understanding.

## 2018-03-28 ENCOUNTER — Other Ambulatory Visit: Payer: Self-pay | Admitting: Family Medicine

## 2018-04-10 ENCOUNTER — Other Ambulatory Visit: Payer: Self-pay

## 2018-04-10 ENCOUNTER — Telehealth: Payer: Self-pay | Admitting: Family Medicine

## 2018-04-10 MED ORDER — HYDROCHLOROTHIAZIDE 25 MG PO TABS: 25.0000 mg | ORAL_TABLET | Freq: Every day | ORAL | 5 refills | Status: DC

## 2018-04-10 NOTE — Telephone Encounter (Signed)
Tried calling [phone] number but call would not go through. Called [phone] number, NA and unable to leave a vm due to vm box not being set up yet.

## 2018-04-10 NOTE — Telephone Encounter (Signed)
Patient's wife notified and verbalized understanding. Prescription sent to pharmacy.

## 2018-04-10 NOTE — Telephone Encounter (Signed)
(  Normal diastolics, systolics avg high 161W, HR avg 70 or so).  BP and HR numbers reviewed.  I recommend he increase his hctz 12.5mg  to TWO tabs once a day. Pls send in rx for HCTZ 25mg , 1 po qd, #30, RF x 5-->for pt to start when he finishes up his 12.5mg  tabs. Call with bp's and heart rates again in 2-3 weeks.--thx

## 2018-04-10 NOTE — Telephone Encounter (Signed)
Copied from [place] [id number]. Topic: Quick Communication - See Telephone Encounter >> Apr 10, 2018  9:15 AM Bea Graff, NT wrote: CRM for notification. See Telephone encounter for: 04/10/18. Pts wife calling to report blood pressures for this pt. The blood pressures are as follows:  03/26/18: 140/80 Pulse: 68                   04/02/18: 138/67 Pulse: 67                  04/09/18: 116/68 Pulse: 69 03/27/18: 155/74 Pulse: 63                   04/03/18: 149/73 Pulse: 59                  04/10/18: 131/67 Pulse: 67 03/28/18: 144/73 Pulse: 69                   04/04/18: 135/68 Pulse: 60 03/29/18: 144/74 Pulse: 76                   04/05/18: 134/69 Pulse: 62 03/30/18: 115/70 Pulse: 85                   04/06/18: 141/74 Pulse: 71 03/31/18: 123/63 Pulse: 80                   04/07/18: 142/71 Pulse: 66 04/01/18: 125/69 Pulse: 55                   04/08/18: 137/66 Pulse: 68

## 2018-04-10 NOTE — Telephone Encounter (Signed)
Please advise. Thanks.  

## 2018-05-01 ENCOUNTER — Ambulatory Visit (INDEPENDENT_AMBULATORY_CARE_PROVIDER_SITE_OTHER): Payer: Medicare Other | Admitting: *Deleted

## 2018-05-01 ENCOUNTER — Other Ambulatory Visit (INDEPENDENT_AMBULATORY_CARE_PROVIDER_SITE_OTHER): Payer: Medicare Other

## 2018-05-01 DIAGNOSIS — E7800 Pure hypercholesterolemia, unspecified: Secondary | ICD-10-CM

## 2018-05-01 DIAGNOSIS — E538 Deficiency of other specified B group vitamins: Secondary | ICD-10-CM | POA: Diagnosis not present

## 2018-05-01 LAB — LIPID PANEL
Cholesterol: 164 mg/dL (ref 0–200)
HDL: 45.7 mg/dL (ref >39.00)
LDL Cholesterol: 102 mg/dL — ABNORMAL HIGH (ref 0–99)
NONHDL: 117.95
Total CHOL/HDL Ratio: 4
Triglycerides: 80 mg/dL (ref 0.0–149.0)
VLDL: 16 mg/dL (ref 0.0–40.0)

## 2018-05-01 NOTE — Progress Notes (Addendum)
Jeffrey [name] is a 77 y.o. male presents to the office today for his monthly Vitamin B12 injection, per physician's orders. Original order:  Cyanocobalamin, 1000 mcg/mL, IM was administered left detoid today. Patient tolerated injection. Patient due for follow up labs/provider appt: No Date due: N/A, appt made No Patient next injection due: 06/01/18, appt made Yes, apt 06/03/18  [place] screening examination/treatment/procedure(s) were performed by non-physician practitioner and as supervising physician I was immediately available for consultation/collaboration.  I agree with above assessment and plan.  Electronically Signed by: Howard Pouch, DO [facility]

## 2018-05-02 ENCOUNTER — Telehealth: Payer: Self-pay | Admitting: Family Medicine

## 2018-05-02 ENCOUNTER — Other Ambulatory Visit: Payer: Self-pay | Admitting: *Deleted

## 2018-05-02 MED ORDER — ATORVASTATIN CALCIUM 40 MG PO TABS: 40.0000 mg | ORAL_TABLET | Freq: Every day | ORAL | 5 refills | Status: DC

## 2018-05-02 NOTE — Telephone Encounter (Signed)
Reviewed blood pressure and heart rate data that pt dropped off recently. Avg bp 135/70s, HR 70 avg. No change in meds recommended at this time.-thx

## 2018-05-03 NOTE — Telephone Encounter (Signed)
Tried calling, NA and unable to leave a message due to vm box not being set up yet.

## 2018-05-03 NOTE — Telephone Encounter (Signed)
Pts wife advised and voiced understanding, okay per DPR. 

## 2018-06-03 ENCOUNTER — Ambulatory Visit (INDEPENDENT_AMBULATORY_CARE_PROVIDER_SITE_OTHER): Payer: Medicare Other | Admitting: *Deleted

## 2018-06-03 DIAGNOSIS — Z23 Encounter for immunization: Secondary | ICD-10-CM

## 2018-06-03 DIAGNOSIS — E538 Deficiency of other specified B group vitamins: Secondary | ICD-10-CM

## 2018-06-03 MED ORDER — CYANOCOBALAMIN 1000 MCG/ML IJ SOLN
1000.0000 ug | Freq: Once | INTRAMUSCULAR | Status: AC
  Administered 2018-06-03: 1000 ug via INTRAMUSCULAR

## 2018-06-03 NOTE — Progress Notes (Signed)
Patient presents today for B12 injection patient tolerated injection well.

## 2018-07-03 ENCOUNTER — Ambulatory Visit (INDEPENDENT_AMBULATORY_CARE_PROVIDER_SITE_OTHER): Payer: Medicare Other

## 2018-07-03 DIAGNOSIS — E538 Deficiency of other specified B group vitamins: Secondary | ICD-10-CM

## 2018-07-03 MED ORDER — CYANOCOBALAMIN 1000 MCG/ML IJ SOLN
1000.0000 ug | Freq: Once | INTRAMUSCULAR | Status: AC
  Administered 2018-07-03: 1000 ug via INTRAMUSCULAR

## 2018-07-03 NOTE — Progress Notes (Signed)
Patient presented today for Vitamin B12 injection. Tolerated well and left with no complaints.

## 2018-07-15 NOTE — Progress Notes (Signed)
Pt with vit B12 deficiency.  Agree with vit B12 1000 mcg IM in office today. Signed:  Crissie Sickles, MD           07/15/2018

## 2018-08-05 ENCOUNTER — Ambulatory Visit (INDEPENDENT_AMBULATORY_CARE_PROVIDER_SITE_OTHER): Payer: Medicare Other

## 2018-08-05 DIAGNOSIS — E538 Deficiency of other specified B group vitamins: Secondary | ICD-10-CM | POA: Diagnosis not present

## 2018-08-05 MED ORDER — CYANOCOBALAMIN 1000 MCG/ML IJ SOLN
1000.0000 ug | Freq: Once | INTRAMUSCULAR | Status: AC
  Administered 2018-08-05: 1000 ug via INTRAMUSCULAR

## 2018-08-05 NOTE — Progress Notes (Signed)
Patient in for monthly B12 injection.  B12 1,086mcg IM administered in left deltoid, tolerated well.   Roderic Ovens, RN

## 2018-09-05 ENCOUNTER — Ambulatory Visit (INDEPENDENT_AMBULATORY_CARE_PROVIDER_SITE_OTHER): Payer: Medicare Other | Admitting: Family Medicine

## 2018-09-05 DIAGNOSIS — E538 Deficiency of other specified B group vitamins: Secondary | ICD-10-CM

## 2018-09-05 MED ORDER — CYANOCOBALAMIN 1000 MCG/ML IJ SOLN
1000.0000 ug | Freq: Once | INTRAMUSCULAR | Status: AC
  Administered 2018-09-05: 1000 ug via INTRAMUSCULAR

## 2018-09-05 NOTE — Progress Notes (Signed)
Patient presented to office for monthly Vitamin B 12 injection. Patient tolerated injection well.

## 2018-09-10 ENCOUNTER — Encounter (INDEPENDENT_AMBULATORY_CARE_PROVIDER_SITE_OTHER): Payer: Self-pay | Admitting: *Deleted

## 2018-09-26 ENCOUNTER — Encounter: Payer: Self-pay | Admitting: Family Medicine

## 2018-09-26 ENCOUNTER — Ambulatory Visit (INDEPENDENT_AMBULATORY_CARE_PROVIDER_SITE_OTHER): Payer: Medicare Other | Admitting: Family Medicine

## 2018-09-26 VITALS — BP 114/71 | HR 57 | Temp 98.1°F | Resp 16 | Ht 69.0 in | Wt 143.5 lb

## 2018-09-26 DIAGNOSIS — Z0000 Encounter for general adult medical examination without abnormal findings: Secondary | ICD-10-CM | POA: Diagnosis not present

## 2018-09-26 DIAGNOSIS — I10 Essential (primary) hypertension: Secondary | ICD-10-CM

## 2018-09-26 DIAGNOSIS — E7800 Pure hypercholesterolemia, unspecified: Secondary | ICD-10-CM | POA: Diagnosis not present

## 2018-09-26 LAB — COMPREHENSIVE METABOLIC PANEL
ALT: 7 U/L (ref 0–53)
AST: 12 U/L (ref 0–37)
Albumin: 4.4 g/dL (ref 3.5–5.2)
Alkaline Phosphatase: 88 U/L (ref 39–117)
BUN: 11 mg/dL (ref 6–23)
CO2: 33 mEq/L — ABNORMAL HIGH (ref 19–32)
Calcium: 10.2 mg/dL (ref 8.4–10.5)
Chloride: 99 mEq/L (ref 96–112)
Creatinine, Ser: 1.22 mg/dL (ref 0.40–1.50)
GFR: 57.84 mL/min — AB (ref >60.00)
Glucose, Bld: 100 mg/dL — ABNORMAL HIGH (ref 70–99)
POTASSIUM: 4.7 meq/L (ref 3.5–5.1)
Sodium: 137 mEq/L (ref 135–145)
TOTAL PROTEIN: 7.2 g/dL (ref 6.0–8.3)
Total Bilirubin: 0.6 mg/dL (ref 0.2–1.2)

## 2018-09-26 LAB — CBC WITH DIFFERENTIAL/PLATELET
Basophils Absolute: 0.1 10*3/uL (ref 0.0–0.1)
Basophils Relative: 1.1 % (ref 0.0–3.0)
Eosinophils Absolute: 0.6 10*3/uL (ref 0.0–0.7)
Eosinophils Relative: 6.5 % — ABNORMAL HIGH (ref 0.0–5.0)
HCT: 45.2 % (ref 39.0–52.0)
Hemoglobin: 15.5 g/dL (ref 13.0–17.0)
LYMPHS ABS: 1.5 10*3/uL (ref 0.7–4.0)
Lymphocytes Relative: 15.4 % (ref 12.0–46.0)
MCHC: 34.3 g/dL (ref 30.0–36.0)
MCV: 86.5 fl (ref 78.0–100.0)
MONOS PCT: 7.9 % (ref 3.0–12.0)
Monocytes Absolute: 0.8 10*3/uL (ref 0.1–1.0)
Neutro Abs: 6.5 10*3/uL (ref 1.4–7.7)
Neutrophils Relative %: 69.1 % (ref 43.0–77.0)
Platelets: 230 10*3/uL (ref 150.0–400.0)
RBC: 5.22 Mil/uL (ref 4.22–5.81)
RDW: 13.8 % (ref 11.5–15.5)
WBC: 9.5 10*3/uL (ref 4.0–10.5)

## 2018-09-26 LAB — LIPID PANEL
Cholesterol: 156 mg/dL (ref 0–200)
HDL: 44.4 mg/dL (ref >39.00)
LDL Cholesterol: 88 mg/dL (ref 0–99)
NONHDL: 111.77
Total CHOL/HDL Ratio: 4
Triglycerides: 118 mg/dL (ref 0.0–149.0)
VLDL: 23.6 mg/dL (ref 0.0–40.0)

## 2018-09-26 NOTE — Progress Notes (Signed)
Office Note 09/26/2018  CC:  Chief Complaint  Patient presents with  . Annual Exam    Pt is fasting.     HPI:  Jeffrey [name] is a 78 y.o. White male who is here for annual health maintenance exam.  He still smokes about 3 packs of cigs/week (down from 1 pack per day).  Exercise: he is independent, somewhat active but not so much in cold weather. Diet: not working on anything in particular.  Takes hctz 15m qd.  No longer takes amlodipine. Home bp's normal.   Past Medical History:  Diagnosis Date  . Adenomatous colon polyp    Dr. RLaural Golden(28 polyps on 1st endo, 3 on 2nd endo a year later.  8 on colonoscopy 02/26/14--recall 3 yrs.  Multiple adenomatous polyps on 09/20/17 TCS--recall 1 yr.  . Aortic insufficiency    echo 03/25/12-EF>55%, mild-mod Aortic regurg, sclerotic aortic valve,   . BPH (benign prostatic hyperplasia)    Dr. JLum Babe(pt is asymptomatic)  . Carotid artery occlusion    carotid dopplers 02/06/12-patent left carotid endarterectomy; right internal carotid 40-59%.  Repeat dopplers 03/2017, 40-59% stenosed bilat int carot--per vasc needs rpt 9 mo.  . Chronic renal insufficiency, stage II (mild)    Borderline stage II/III--CrCl @ 60.  No RAS on 2003 angiogram (done for + renal artery dopplers--deemed eventually to have been a false positive).  Dr. BGwenlyn Found  . DDD (degenerative disc disease), lumbar 02/2013   with spondylosis and moderate spinal stenosis+ left L5 and S1 nerve root impingement  . Ectatic abdominal aorta ([place] 08/08/2016   2.8 cm at widest point: repeat aortic u/s 08/2021.  .Marland KitchenEssential tremor   . Fatty liver 2006   Noted on abd u/s and renal u/s 2006/2007  . History of hyperkalemia 02/2017   mild; recommended pt cut his ARB in half.  . History of MI (myocardial infarction)    Large fixed inferior defect on myocardial perfusion imaging 03/2017.  .Marland KitchenHyperlipidemia    Dr. HPercival Spanishstarted statin 03/2017.    .Marland KitchenHypertension   . PAD (peripheral artery  disease) ([place]    carotids: Dr. HPercival Spanishstarted pt on statin for CV risk reduction 03/2017  . Plantar wart of left foot 12/21/2012  . Prediabetes 2019   A1c 5.9%  . SOB (shortness of breath)    MET test 03/25/12-mildly abnormal  . Solitary pulmonary nodule 03/14/2013   780mRUL 03/08/13--stable on repeat 07/2013, 03/2015, and 01/2016.  No additional imaging is required.  . Tobacco dependence    ongoing as of 02/2016  . Vitamin B12 deficiency without anemia 2016   Intrinsic factor NEG: home vit B12 injections started 11/17/14    Past Surgical History:  Procedure Laterality Date  . APPENDECTOMY  remote  . Arm surgery  July 2010   Left arm cyst/Lipoma  . CARDIOVASCULAR STRESS TEST  03/2017   Low risk study (likely artifact seen, but no reversible ischemia, EF 59%, no wall motion abnormality.  . carotid dopplers  02/2015; 02/2016;12/2017   2017:  R ICA 1-39%, L ICA 40-59%: no change from 2016. 03/2017 rpt 40-59% bilat ICA stenosis--vasc recommended rpt 9 mo.  12/2017 1-39% bilat ICA stenosis.  Repeat 1 yr.  . Marland KitchenAROTID ENDARTERECTOMY  11/25/08   Left     ICA  . COLONOSCOPY N/A 02/26/2014   Recall 3 yrs: (+ polypectomy--tubular adenoma) Procedure: COLONOSCOPY;  Surgeon: NaRogene HoustonMD;  [facility];  Service: Endoscopy;  Laterality: N/A;  830  .  COLONOSCOPY N/A 09/20/2017   Multiple adenomatous polyps: recall 1 yr.  Procedure: COLONOSCOPY;  Surgeon: Rogene Houston, MD;  Location: [facility];  Service: Endoscopy;  Laterality: N/A;  730  . COLONOSCOPY W/ POLYPECTOMY  09/20/2017   Adenomatous polyp: recall 3-5 yrs (Dr. Laural Golden)  . POLYPECTOMY  09/20/2017   Procedure: POLYPECTOMY;  Surgeon: Rogene Houston, MD;  Location: [facility];  Service: Endoscopy;;  Ascending colon, Transverse colon, Hepatic flexure, Cecum  . TONSILLECTOMY  remote  . TONSILLECTOMY    . TRANSTHORACIC ECHOCARDIOGRAM  03/2012   2013 --EF 55%, normal LV syst fxn, impaired diast relaxation, mild/mod aortic regurg.   Marland Kitchen UMBILICAL HERNIA REPAIR      Family History  Problem Relation Age of Onset  . Coronary artery disease Mother   . Stroke Mother   . Diabetes Mother   . Hypertension Mother   . Parkinson's disease Mother   . Stroke Sister   . Cancer Brother   . Heart disease Brother   . Colon cancer Neg Hx     Social History   Socioeconomic History  . Marital status: Married    Spouse name: Not on file  . Number of children: Not on file  . Years of education: Not on file  . Highest education level: Not on file  Occupational History  . Not on file  Social Needs  . Financial resource strain: Not on file  . Food insecurity:    Worry: Not on file    Inability: Not on file  . Transportation needs:    Medical: Not on file    Non-medical: Not on file  Tobacco Use  . Smoking status: Current Every Day Smoker    Packs/day: 1.00    Years: 60.00    Pack years: 60.00    Types: Cigarettes  . Smokeless tobacco: Never Used  Substance and Sexual Activity  . Alcohol use: No    Alcohol/week: 0.0 standard drinks  . Drug use: No  . Sexual activity: Not on file  Lifestyle  . Physical activity:    Days per week: Not on file    Minutes per session: Not on file  . Stress: Not on file  Relationships  . Social connections:    Talks on phone: Not on file    Gets together: Not on file    Attends religious service: Not on file    Active member of club or organization: Not on file    Attends meetings of clubs or organizations: Not on file    Relationship status: Not on file  . Intimate partner violence:    Fear of current or ex partner: Not on file    Emotionally abused: Not on file    Physically abused: Not on file    Forced sexual activity: Not on file  Other Topics Concern  . Not on file  Social History Narrative   Married, 2 children.   Orig from [place].   Retired from [employer].   Current smoker; 1 ppd (x 50 yrs).  No hx alc, no drugs.   Active person, no formal exercise.     Outpatient Medications Prior to Visit  Medication Sig Dispense Refill  . atorvastatin (LIPITOR) 40 MG tablet Take 1 tablet (40 mg total) by mouth daily. 30 tablet 5  . hydrochlorothiazide (HYDRODIURIL) 25 MG tablet Take 1 tablet (25 mg total) by mouth daily. 30 tablet 5  . aspirin 81 MG tablet Take 1 tablet (81 mg  total) by mouth daily. 30 tablet   . amLODipine (NORVASC) 10 MG tablet Take 1 tablet (10 mg total) by mouth daily. (Patient not taking: Reported on 09/26/2018) 30 tablet 1  . gabapentin (NEURONTIN) 300 MG capsule 1 cap po qAM, 1 cap po q afternoon, and 2 caps po qhs (Patient not taking: Reported on 03/25/2018) 120 capsule 6   No facility-administered medications prior to visit.     Allergies  Allergen Reactions  . Clonidine Derivatives Rash    ROS Review of Systems  Constitutional: Negative for appetite change, chills, fatigue and fever.  HENT: Negative for congestion, dental problem, ear pain and sore throat.   Eyes: Negative for discharge, redness and visual disturbance.  Respiratory: Negative for cough, chest tightness, shortness of breath and wheezing.   Cardiovascular: Negative for chest pain, palpitations and leg swelling.  Gastrointestinal: Negative for abdominal pain, blood in stool, diarrhea, nausea and vomiting.  Genitourinary: Negative for difficulty urinating, dysuria, flank pain, frequency, hematuria and urgency.  Musculoskeletal: Negative for arthralgias, back pain, joint swelling, myalgias and neck stiffness.  Skin: Negative for pallor and rash.  Neurological: Negative for dizziness, speech difficulty, weakness and headaches.  Hematological: Negative for adenopathy. Does not bruise/bleed easily.  Psychiatric/Behavioral: Negative for confusion and sleep disturbance. The patient is not nervous/anxious.     PE; Blood pressure 114/71, pulse (!) 57, temperature 98.1 F (36.7 C), temperature source Oral, resp. rate 16, height _0  (1.753 m), weight 143 lb 8  oz (65.1 kg), SpO2 100 %. Body mass index is 21.19 kg/m.  Gen: Alert, well appearing.  Patient is oriented to person, place, time, and situation. AFFECT: pleasant, lucid thought and speech. ENT: Ears: EACs clear, normal epithelium.  TMs with good light reflex and landmarks bilaterally.  Eyes: no injection, icteris, swelling, or exudate.  EOMI, PERRLA. Nose: no drainage or turbinate edema/swelling.  No injection or focal lesion.  Mouth: lips without lesion/swelling.  Oral mucosa pink and moist.  Dentition intact and without obvious caries or gingival swelling.  Oropharynx without erythema, exudate, or swelling.  Neck: supple/nontender.  No LAD, mass, or TM.  Carotid pulses 2+ bilaterally, without bruits. CV: RRR, no m/r/g.   LUNGS: CTA bilat, nonlabored resps, good aeration in all lung fields. ABD: soft, NT, ND, BS normal.  No hepatospenomegaly or mass.  No bruits. EXT: no clubbing, cyanosis, or edema.  Musculoskeletal: no joint swelling, erythema, warmth, or tenderness.  ROM of all joints intact. Skin - no sores or suspicious lesions or rashes or color changes   Pertinent labs:  Lab Results  Component Value Date   VITAMINB12 635 05/15/2017    Lab Results  Component Value Date   TSH 3.96 12/10/2017   Lab Results  Component Value Date   WBC 7.5 12/10/2017   HGB 14.6 12/10/2017   HCT 43.6 12/10/2017   MCV 88.0 12/10/2017   PLT 232.0 12/10/2017   Lab Results  Component Value Date   CREATININE 1.28 12/10/2017   BUN 14 12/10/2017   NA 138 12/10/2017   K 4.5 12/10/2017   CL 103 12/10/2017   CO2 30 12/10/2017   Lab Results  Component Value Date   ALT 6 12/10/2017   AST 10 12/10/2017   ALKPHOS 60 12/10/2017   BILITOT 0.6 12/10/2017   Lab Results  Component Value Date   CHOL 164 05/01/2018   Lab Results  Component Value Date   HDL 45.70 05/01/2018   Lab Results  Component Value Date   LDLCALC  102 (H) 05/01/2018   Lab Results  Component Value Date   TRIG 80.0  05/01/2018   Lab Results  Component Value Date   CHOLHDL 4 05/01/2018   Lab Results  Component Value Date   PSA 0.85 12/23/2012   Lab Results  Component Value Date   HGBA1C 5.9 12/10/2017    ASSESSMENT AND PLAN:   Health maintenance exam: Reviewed age and gender appropriate health maintenance issues (prudent diet, regular exercise, health risks of tobacco and excessive alcohol, use of seatbelts, fire alarms in home, use of sunscreen).  Also reviewed age and gender appropriate health screening as well as vaccine recommendations. Vaccines: all UTD, including shingrix. Labs: CBC, CMET, FLP. Prostate ca screening: no further screening recommended at this time: risks outweigh benefits. Colon ca screening: next colonoscopy due 2022-2024 (Dr. Laural Golden).  An After Visit Summary was printed and given to the patient.  FOLLOW UP:  Return in about 6 months (around 03/27/2019) for routine chronic illness f/u.  Signed:  Crissie Sickles, MD           09/26/2018

## 2018-09-26 NOTE — Patient Instructions (Signed)

## 2018-10-07 ENCOUNTER — Ambulatory Visit: Payer: Medicare Other

## 2018-10-07 ENCOUNTER — Ambulatory Visit (INDEPENDENT_AMBULATORY_CARE_PROVIDER_SITE_OTHER): Payer: Medicare Other | Admitting: Family Medicine

## 2018-10-07 DIAGNOSIS — E538 Deficiency of other specified B group vitamins: Secondary | ICD-10-CM | POA: Diagnosis not present

## 2018-10-07 MED ORDER — CYANOCOBALAMIN 1000 MCG/ML IJ SOLN
1000.0000 ug | Freq: Once | INTRAMUSCULAR | Status: AC
  Administered 2018-10-07: 1000 ug via INTRAMUSCULAR

## 2018-10-07 NOTE — Progress Notes (Signed)
Pt with vit B12 deficiency.  Agree with vit B12 1000 mcg IM in office today. Signed:  Crissie Sickles, MD           10/07/2018

## 2018-10-07 NOTE — Progress Notes (Signed)
Patient presented to office today for monthly Vitamin B12 injection, tolerated well.  

## 2018-10-17 ENCOUNTER — Encounter (INDEPENDENT_AMBULATORY_CARE_PROVIDER_SITE_OTHER): Payer: Self-pay | Admitting: *Deleted

## 2018-10-17 ENCOUNTER — Telehealth (INDEPENDENT_AMBULATORY_CARE_PROVIDER_SITE_OTHER): Payer: Self-pay | Admitting: *Deleted

## 2018-10-17 MED ORDER — PEG 3350-KCL-NA BICARB-NACL 420 G PO SOLR: 4000.0000 mL | Freq: Once | ORAL | 0 refills | Status: AC

## 2018-10-17 NOTE — Telephone Encounter (Signed)
Patient needs trilyte 

## 2018-10-23 ENCOUNTER — Other Ambulatory Visit (INDEPENDENT_AMBULATORY_CARE_PROVIDER_SITE_OTHER): Payer: Self-pay | Admitting: *Deleted

## 2018-10-23 DIAGNOSIS — Z860100 Personal history of colon polyps, unspecified: Secondary | ICD-10-CM | POA: Insufficient documentation

## 2018-10-25 ENCOUNTER — Telehealth (INDEPENDENT_AMBULATORY_CARE_PROVIDER_SITE_OTHER): Payer: Self-pay | Admitting: *Deleted

## 2018-10-25 NOTE — Telephone Encounter (Signed)
Referring MD/PCP: [email or web address]   Procedure: tcs  Reason/Indication:  Hx polyps  Has patient had this procedure before?  Yes, 09/2017  If so, when, by whom and where?    Is there a family history of colon cancer?  no  Who?  What age when diagnosed?    Is patient diabetic?   no      Does patient have prosthetic heart valve or mechanical valve?  no  Do you have a pacemaker?  no  Has patient ever had endocarditis? no  Has patient had joint replacement within last 12 months?  no  Is patient constipated or do they take laxatives? no  Does patient have a history of alcohol/drug use?  no  Is patient on blood thinner such as Coumadin, Plavix and/or Aspirin? yes  Medications: asa 81 mg daily, atorvastatin 40 mg daily, hctz 25 mg daily  Allergies: nkda  Medication Adjustment per Dr Lindi Adie, NP: asa 2 days  Procedure date & time: 11/21/18 at 930

## 2018-10-28 NOTE — Telephone Encounter (Signed)
agree

## 2018-11-04 ENCOUNTER — Ambulatory Visit (INDEPENDENT_AMBULATORY_CARE_PROVIDER_SITE_OTHER): Payer: Medicare Other

## 2018-11-04 DIAGNOSIS — E538 Deficiency of other specified B group vitamins: Secondary | ICD-10-CM | POA: Diagnosis not present

## 2018-11-04 MED ORDER — CYANOCOBALAMIN 1000 MCG/ML IJ SOLN
1000.0000 ug | Freq: Once | INTRAMUSCULAR | Status: AC
  Administered 2018-11-04: 1000 ug via INTRAMUSCULAR

## 2018-11-04 NOTE — Progress Notes (Signed)
Jeffrey [name] is a 78 y.o. male presents to the office today for monthly B12 injections, per physician's orders. B12 1,05mcg/ml administered IM in left deltoid. Patient tolerated injection. Patient next injection due: 12/04/2018, appt made Yes  Gerilyn Nestle

## 2018-11-13 NOTE — Progress Notes (Signed)
Pt with vit B12 deficiency.  Agree with vit B12 1000 mcg IM in office today. Signed:  Crissie Sickles, MD           11/13/2018

## 2018-11-21 ENCOUNTER — Other Ambulatory Visit: Payer: Self-pay

## 2018-11-21 ENCOUNTER — Encounter (HOSPITAL_COMMUNITY): Payer: Self-pay | Admitting: *Deleted

## 2018-11-21 ENCOUNTER — Encounter (HOSPITAL_COMMUNITY)
Admission: RE | Disposition: A | Discharge status: Home / Self Care | Payer: Self-pay | Source: Home / Self Care | Attending: Internal Medicine

## 2018-11-21 ENCOUNTER — Ambulatory Visit (HOSPITAL_COMMUNITY)
Admission: RE | Admit: 2018-11-21 | Discharge: 2018-11-21 | Disposition: A | Discharge status: Home / Self Care | Payer: Medicare Other | Attending: Internal Medicine | Admitting: Internal Medicine

## 2018-11-21 DIAGNOSIS — Z833 Family history of diabetes mellitus: Secondary | ICD-10-CM | POA: Insufficient documentation

## 2018-11-21 DIAGNOSIS — Z79899 Other long term (current) drug therapy: Secondary | ICD-10-CM | POA: Insufficient documentation

## 2018-11-21 DIAGNOSIS — N182 Chronic kidney disease, stage 2 (mild): Secondary | ICD-10-CM | POA: Insufficient documentation

## 2018-11-21 DIAGNOSIS — K644 Residual hemorrhoidal skin tags: Secondary | ICD-10-CM | POA: Diagnosis not present

## 2018-11-21 DIAGNOSIS — N400 Enlarged prostate without lower urinary tract symptoms: Secondary | ICD-10-CM | POA: Insufficient documentation

## 2018-11-21 DIAGNOSIS — I252 Old myocardial infarction: Secondary | ICD-10-CM | POA: Insufficient documentation

## 2018-11-21 DIAGNOSIS — E1122 Type 2 diabetes mellitus with diabetic chronic kidney disease: Secondary | ICD-10-CM | POA: Diagnosis not present

## 2018-11-21 DIAGNOSIS — Z1211 Encounter for screening for malignant neoplasm of colon: Secondary | ICD-10-CM | POA: Diagnosis not present

## 2018-11-21 DIAGNOSIS — I739 Peripheral vascular disease, unspecified: Secondary | ICD-10-CM | POA: Insufficient documentation

## 2018-11-21 DIAGNOSIS — Z09 Encounter for follow-up examination after completed treatment for conditions other than malignant neoplasm: Secondary | ICD-10-CM

## 2018-11-21 DIAGNOSIS — D123 Benign neoplasm of transverse colon: Secondary | ICD-10-CM | POA: Insufficient documentation

## 2018-11-21 DIAGNOSIS — E785 Hyperlipidemia, unspecified: Secondary | ICD-10-CM | POA: Insufficient documentation

## 2018-11-21 DIAGNOSIS — G250 Essential tremor: Secondary | ICD-10-CM | POA: Diagnosis not present

## 2018-11-21 DIAGNOSIS — Z86010 Personal history of colonic polyps: Secondary | ICD-10-CM | POA: Diagnosis not present

## 2018-11-21 DIAGNOSIS — Z820 Family history of epilepsy and other diseases of the nervous system: Secondary | ICD-10-CM | POA: Insufficient documentation

## 2018-11-21 DIAGNOSIS — I351 Nonrheumatic aortic (valve) insufficiency: Secondary | ICD-10-CM | POA: Insufficient documentation

## 2018-11-21 DIAGNOSIS — M48061 Spinal stenosis, lumbar region without neurogenic claudication: Secondary | ICD-10-CM | POA: Insufficient documentation

## 2018-11-21 DIAGNOSIS — Z823 Family history of stroke: Secondary | ICD-10-CM | POA: Insufficient documentation

## 2018-11-21 DIAGNOSIS — Z8249 Family history of ischemic heart disease and other diseases of the circulatory system: Secondary | ICD-10-CM | POA: Diagnosis not present

## 2018-11-21 DIAGNOSIS — K5730 Diverticulosis of large intestine without perforation or abscess without bleeding: Secondary | ICD-10-CM | POA: Insufficient documentation

## 2018-11-21 DIAGNOSIS — I129 Hypertensive chronic kidney disease with stage 1 through stage 4 chronic kidney disease, or unspecified chronic kidney disease: Secondary | ICD-10-CM | POA: Insufficient documentation

## 2018-11-21 DIAGNOSIS — D120 Benign neoplasm of cecum: Secondary | ICD-10-CM | POA: Diagnosis not present

## 2018-11-21 DIAGNOSIS — Z809 Family history of malignant neoplasm, unspecified: Secondary | ICD-10-CM | POA: Insufficient documentation

## 2018-11-21 DIAGNOSIS — F17210 Nicotine dependence, cigarettes, uncomplicated: Secondary | ICD-10-CM | POA: Diagnosis not present

## 2018-11-21 DIAGNOSIS — Z888 Allergy status to other drugs, medicaments and biological substances status: Secondary | ICD-10-CM | POA: Insufficient documentation

## 2018-11-21 DIAGNOSIS — E875 Hyperkalemia: Secondary | ICD-10-CM | POA: Diagnosis not present

## 2018-11-21 DIAGNOSIS — M5136 Other intervertebral disc degeneration, lumbar region: Secondary | ICD-10-CM | POA: Diagnosis not present

## 2018-11-21 DIAGNOSIS — Z7982 Long term (current) use of aspirin: Secondary | ICD-10-CM | POA: Diagnosis not present

## 2018-11-21 HISTORY — PX: POLYPECTOMY: SHX5525

## 2018-11-21 HISTORY — PX: COLONOSCOPY: SHX5424

## 2018-11-21 SURGERY — COLONOSCOPY
Anesthesia: Moderate Sedation

## 2018-11-21 MED ORDER — MEPERIDINE HCL 50 MG/ML IJ SOLN
INTRAMUSCULAR | Status: DC | PRN
  Administered 2018-11-21: 25 mg

## 2018-11-21 MED ORDER — MIDAZOLAM HCL 5 MG/5ML IJ SOLN
INTRAMUSCULAR | Status: DC | PRN
  Administered 2018-11-21: 2 mg via INTRAVENOUS
  Administered 2018-11-21: 1 mg via INTRAVENOUS

## 2018-11-21 MED ORDER — SODIUM CHLORIDE 0.9 % IV SOLN
INTRAVENOUS | Status: DC
  Administered 2018-11-21: 09:00:00 via INTRAVENOUS

## 2018-11-21 NOTE — Op Note (Signed)
[facility] Patient Name: Jeffrey [name] Procedure Date: 11/21/2018 9:31 AM MRN: [id number] Date of Birth: 10-31-1940 Attending MD: Hildred Laser , MD CSN: [id number] Age: 78 Admit Type: Outpatient Procedure:                Colonoscopy Indications:              High risk colon cancer surveillance: Personal                            history of colonic polyps Providers:                Hildred Laser, MD, Janeece Riggers, RN, Aram Candela Referring MD:             Tammi Sou, MD Medicines:                Meperidine 25 mg IV, Midazolam 3 mg IV Complications:            No immediate complications. Estimated Blood Loss:     Estimated blood loss was minimal. Procedure:                Pre-Anesthesia Assessment:                           - Prior to the procedure, a History and Physical                            was performed, and patient medications and                            allergies were reviewed. The patient's tolerance of                            previous anesthesia was also reviewed. The risks                            and benefits of the procedure and the sedation                            options and risks were discussed with the patient.                            All questions were answered, and informed consent                            was obtained. Prior Anticoagulants: The patient has                            taken no previous anticoagulant or antiplatelet                            agents. ASA Grade Assessment: II - A patient with                            mild systemic disease. After reviewing the risks  and benefits, the patient was deemed in                            satisfactory condition to undergo the procedure.                           After obtaining informed consent, the colonoscope                            was passed under direct vision. Throughout the                            procedure, the patient's blood pressure,  pulse, and                            oxygen saturations were monitored continuously. The                            [id number]-[id number] ([id number]) scope was introduced through                            the anus and advanced to the the cecum, identified                            by appendiceal orifice and ileocecal valve. The                            colonoscopy was somewhat difficult due to                            restricted mobility of the colon. The patient                            tolerated the procedure well. The quality of the                            bowel preparation was good. The ileocecal valve,                            appendiceal orifice, and rectum were photographed. Scope In: 10:02:37 AM Scope Out: 10:36:03 AM Scope Withdrawal Time: 0 hours 17 minutes 43 seconds  Total Procedure Duration: 0 hours 33 minutes 26 seconds  Findings:      Skin tags were found on perianal exam.      Two sessile polyps were found in the hepatic flexure and cecum. The       polyps were small in size. These were biopsied with a cold forceps for       histology.      Two sessile polyps were found in the proximal transverse colon. The       polyps were small in size. These polyps were removed with a cold snare.       Resection and retrieval were complete. The pathology specimen was placed       into Bottle Number 1.      A 8 mm polyp  was found in the proximal transverse colon. The polyp was       semi-pedunculated. The polyp was removed with a hot snare. Resection and       retrieval were complete. The pathology specimen was placed into Bottle       Number 1.      A single medium-mouthed diverticulum was found in the sigmoid colon.      External hemorrhoids were found during retroflexion. The hemorrhoids       were medium-sized. Impression:               - Perianal skin tags found on perianal exam.                           - Two small polyps at the hepatic flexure and in                             the cecum. Biopsied.                           - Two small polyps in the proximal transverse                            colon, removed with a cold snare. Resected and                            retrieved.                           - One 8 mm polyp in the proximal transverse colon,                            removed with a hot snare. Resected and retrieved.                           - Diverticulosis in the sigmoid colon.                           - External hemorrhoids. Moderate Sedation:      Moderate (conscious) sedation was administered by the endoscopy nurse       and supervised by the endoscopist. The following parameters were       monitored: oxygen saturation, heart rate, blood pressure, CO2       capnography and response to care. Total physician intraservice time was       24 minutes. Recommendation:           - Patient has a contact number available for                            emergencies. The signs and symptoms of potential                            delayed complications were discussed with the                            patient. Return to normal activities tomorrow.  Written discharge instructions were provided to the                            patient.                           - High fiber diet today.                           - Continue present medications.                           - No aspirin, ibuprofen, naproxen, or other                            non-steroidal anti-inflammatory drugs for 7 days                            after polyp removal.                           - Await pathology results.                           - Repeat colonoscopy in 3 years for surveillance. Procedure Code(s):        --- Professional ---                           [phone], Colonoscopy, flexible; with removal of                            tumor(s), polyp(s), or other lesion(s) by snare                            technique                           [id number], 59,  Colonoscopy, flexible; with biopsy,                            single or multiple                           99153, Moderate sedation; each additional 15                            minutes intraservice time                           G0500, Moderate sedation services provided by the                            same physician or other qualified health care                            professional performing a gastrointestinal  endoscopic service that sedation supports,                            requiring the presence of an independent trained                            observer to assist in the monitoring of the                            patient's level of consciousness and physiological                            status; initial 15 minutes of intra-service time;                            patient age 70 years or older (additional time may                            be reported with [phone], as appropriate) Diagnosis Code(s):        --- Professional ---                           K64.4, Residual hemorrhoidal skin tags                           Z86.010, Personal history of colonic polyps                           D12.0, Benign neoplasm of cecum                           D12.3, Benign neoplasm of transverse colon (hepatic                            flexure or splenic flexure)                           K57.30, Diverticulosis of large intestine without                            perforation or abscess without bleeding CPT copyright 2018 [employer]. All rights reserved. The codes documented in this report are preliminary and upon coder review may  be revised to meet current compliance requirements. Hildred Laser, MD Hildred Laser, MD 11/21/2018 10:49:44 AM This report has been signed electronically. Number of Addenda: 0

## 2018-11-21 NOTE — H&P (Signed)
Jeffrey [name] is an 78 y.o. male.   Chief Complaint: Patient is here for colonoscopy. HPI: Patient is 78 year old Caucasian male who has history of multiple colonic adenomas who is here for surveillance colonoscopy.  His last exam was about 1 years ago.  He denies abdominal pain change in bowel habits or rectal bleeding. Family history is negative for CRC.  Past Medical History:  Diagnosis Date  . Adenomatous colon polyp    Dr. Laural Golden (28 polyps on 1st endo, 3 on 2nd endo a year later.  8 on colonoscopy 02/26/14--recall 3 yrs.  Multiple adenomatous polyps on 09/20/17 TCS--recall 1 yr.  . Aortic insufficiency    echo 03/25/12-EF>55%, mild-mod Aortic regurg, sclerotic aortic valve,   . BPH (benign prostatic hyperplasia)    Dr. Lum Babe (pt is asymptomatic)  . Carotid artery occlusion    carotid dopplers 02/06/12-patent left carotid endarterectomy; right internal carotid 40-59%.  Repeat dopplers 03/2017, 40-59% stenosed bilat int carot--per vasc needs rpt 9 mo.  . Chronic renal insufficiency, stage II (mild)    Borderline stage II/III--CrCl @ 62.  No RAS on 2003 angiogram (done for + renal artery dopplers--deemed eventually to have been a false positive).  Dr. Gwenlyn Found.  . DDD (degenerative disc disease), lumbar 02/2013   with spondylosis and moderate spinal stenosis+ left L5 and S1 nerve root impingement  . Ectatic abdominal aorta ([place] [facility]) 08/08/2016   2.8 cm at widest point: repeat aortic u/s 08/2021.  Marland Kitchen Essential tremor   . Fatty liver 2006   Noted on abd u/s and renal u/s 2006/2007  . History of hyperkalemia 02/2017   mild; recommended pt cut his ARB in half.  . History of MI (myocardial infarction)    Large fixed inferior defect on myocardial perfusion imaging 03/2017.  Marland Kitchen Hyperlipidemia    Dr. Percival Spanish started statin 03/2017.    Marland Kitchen Hypertension   . PAD (peripheral artery disease) ([facility])    carotids: Dr. Percival Spanish started pt on statin for CV risk reduction 03/2017  . Plantar wart of left foot 12/21/2012   . Prediabetes 2019   A1c 5.9%  . SOB (shortness of breath)    MET test 03/25/12-mildly abnormal  . Solitary pulmonary nodule 03/14/2013   41m RUL 03/08/13--stable on repeat 07/2013, 03/2015, and 01/2016.  No additional imaging is required.  . Tobacco dependence    ongoing as of 02/2016  . Vitamin B12 deficiency without anemia 2016   Intrinsic factor NEG: home vit B12 injections started 11/17/14    Past Surgical History:  Procedure Laterality Date  . APPENDECTOMY  remote  . Arm surgery  July 2010   Left arm cyst/Lipoma  . CARDIOVASCULAR STRESS TEST  03/2017   Low risk study (likely artifact seen, but no reversible ischemia, EF 59%, no wall motion abnormality.  . carotid dopplers  02/2015; 02/2016;12/2017   2017:  R ICA 1-39%, L ICA 40-59%: no change from 2016. 03/2017 rpt 40-59% bilat ICA stenosis--vasc recommended rpt 9 mo.  12/2017 1-39% bilat ICA stenosis.  Repeat 1 yr.  .Marland KitchenCAROTID ENDARTERECTOMY  11/25/08   Left     ICA  . COLONOSCOPY N/A 02/26/2014   Recall 3 yrs: (+ polypectomy--tubular adenoma) Procedure: COLONOSCOPY;  Surgeon: NRogene Houston MD;  [facility];  Service: Endoscopy;  Laterality: N/A;  830  . COLONOSCOPY N/A 09/20/2017   Multiple adenomatous polyps: recall 1 yr.  Procedure: COLONOSCOPY;  Surgeon: RRogene Houston MD;  [facility];  Service: Endoscopy;  Laterality: N/A;  730  . COLONOSCOPY W/ POLYPECTOMY  09/20/2017   Adenomatous polyp: recall 3-5 yrs (Dr. Laural Golden)  . POLYPECTOMY  09/20/2017   Procedure: POLYPECTOMY;  Surgeon: Rogene Houston, MD;  [facility]: [facility];  Service: Endoscopy;;  Ascending colon, Transverse colon, Hepatic flexure, Cecum  . TONSILLECTOMY  remote  . TONSILLECTOMY    . TRANSTHORACIC ECHOCARDIOGRAM  03/2012   2013 --EF 55%, normal LV syst fxn, impaired diast relaxation, mild/mod aortic regurg.  Marland Kitchen UMBILICAL HERNIA REPAIR      Family History  Problem Relation Age of Onset  . Coronary artery disease Mother   . Stroke  Mother   . Diabetes Mother   . Hypertension Mother   . Parkinson's disease Mother   . Stroke Sister   . Cancer Brother   . Heart disease Brother   . Colon cancer Neg Hx    Social History:  reports that he has been smoking cigarettes. He has a 60.00 pack-year smoking history. He has never used smokeless tobacco. He reports that he does not drink alcohol or use drugs.  Allergies:  Allergies  Allergen Reactions  . Clonidine Derivatives Rash    Medications Prior to Admission  Medication Sig Dispense Refill  . aspirin EC 81 MG tablet Take 81 mg by mouth at bedtime.    Marland Kitchen atorvastatin (LIPITOR) 40 MG tablet Take 1 tablet (40 mg total) by mouth daily. (Patient taking differently: Take 40 mg by mouth at bedtime. ) 30 tablet 5  . cyanocobalamin (,VITAMIN B-12,) 1000 MCG/ML injection Inject 1,000 mcg into the muscle every 30 (thirty) days.    Erick Alley EXTRA STRENGTH [id number] MG PACK Take 1 packet by mouth daily as needed (pain.).    Marland Kitchen hydrochlorothiazide (HYDRODIURIL) 25 MG tablet Take 1 tablet (25 mg total) by mouth daily. (Patient taking differently: Take 25 mg by mouth at bedtime. ) 30 tablet 5  . polyethylene glycol-electrolytes (NULYTELY/GOLYTELY) 420 g solution Take 4,000 mLs by mouth once.      No results found for this or any previous visit (from the past 48 hour(s)). No results found.  ROS  Blood pressure 126/69, pulse (!) 59, temperature 97.6 F (36.4 C), temperature source Oral, resp. rate 15, height 5' 9"  (1.753 m), weight 63.5 kg, SpO2 100 %. Physical Exam  Constitutional:  Well-developed thin Caucasian male in NAD.  Eyes: Conjunctivae are normal. No scleral icterus.  Neck: No thyromegaly present.  Cardiovascular: Normal rate, regular rhythm and normal heart sounds.  No murmur heard. Respiratory: Effort normal and breath sounds normal.  GI: Soft. Bowel sounds are normal.  Musculoskeletal:        General: No edema.  Lymphadenopathy:    He has no cervical adenopathy.   Neurological: He is alert.  Skin: Skin is warm and dry.     Assessment/Plan History of multiple colonic adenomas. Surveillance colonoscopy.  Hildred Laser, MD 11/21/2018, 10:39 AM

## 2018-11-21 NOTE — Discharge Instructions (Signed)
No aspirin or NSAIDs for 1 week. Resume other medications as before. High-fiber diet. No driving for 24 hours. Physician will call with biopsy results.    Colonoscopy, Adult, Care After This sheet gives you information about how to care for yourself after your procedure. Your doctor may also give you more specific instructions. If you have problems or questions, call your doctor. What can I expect after the procedure? After the procedure, it is common to have:  A small amount of blood in your poop for 24 hours.  Some gas.  Mild cramping or bloating in your belly. Follow these instructions at home: General instructions  For the first 24 hours after the procedure: ? Do not drive or use machinery. ? Do not sign important documents. ? Do not drink alcohol. ? Do your daily activities more slowly than normal. ? Eat foods that are soft and easy to digest.  Take over-the-counter or prescription medicines only as told by your doctor. To help cramping and bloating:   Try walking around.  Put heat on your belly (abdomen) as told by your doctor. Use a heat source that your doctor recommends, such as a moist heat pack or a heating pad. ? Put a towel between your skin and the heat source. ? Leave the heat on for 20-30 minutes. ? Remove the heat if your skin turns bright red. This is especially important if you cannot feel pain, heat, or cold. You can get burned. Eating and drinking   Drink enough fluid to keep your pee (urine) clear or pale yellow.  Return to your normal diet as told by your doctor. Avoid heavy or fried foods that are hard to digest.  Avoid drinking alcohol for as long as told by your doctor. Contact a doctor if:  You have blood in your poop (stool) 2-3 days after the procedure. Get help right away if:  You have more than a small amount of blood in your poop.  You see large clumps of tissue (blood clots) in your poop.  Your belly is swollen.  You feel sick  to your stomach (nauseous).  You throw up (vomit).  You have a fever.  You have belly pain that gets worse, and medicine does not help your pain. Summary  After the procedure, it is common to have a small amount of blood in your poop. You may also have mild cramping and bloating in your belly.  For the first 24 hours after the procedure, do not drive or use machinery, do not sign important documents, and do not drink alcohol.  Get help right away if you have a lot of blood in your poop, feel sick to your stomach, have a fever, or have more belly pain. This information is not intended to replace advice given to you by your health care provider. Make sure you discuss any questions you have with your health care provider. Document Released: 09/30/2010 Document Revised: 06/28/2017 Document Reviewed: 05/22/2016 Elsevier Interactive Patient Education  2019 [employer].  Colon Polyps  Polyps are tissue growths inside the body. Polyps can grow in many places, including the large intestine (colon). A polyp may be a round bump or a mushroom-shaped growth. You could have one polyp or several. Most colon polyps are noncancerous (benign). However, some colon polyps can become cancerous over time. Finding and removing the polyps early can help prevent this. What are the causes? The exact cause of colon polyps is not known. What increases the risk? You are  more likely to develop this condition if you:  Have a family history of colon cancer or colon polyps.  Are older than 75 or older than 45 if you are African American.  Have inflammatory bowel disease, such as ulcerative colitis or Crohn's disease.  Have certain hereditary conditions, such as: ? Familial adenomatous polyposis. ? Lynch syndrome. ? Turcot syndrome. ? Peutz-Jeghers syndrome.  Are overweight.  Smoke cigarettes.  Do not get enough exercise.  Drink too much alcohol.  Eat a diet that is high in fat and red meat and low in  fiber.  Had childhood cancer that was treated with abdominal radiation. What are the signs or symptoms? Most polyps do not cause symptoms. If you have symptoms, they may include:  Blood coming from your rectum when having a bowel movement.  Blood in your stool. The stool may look dark red or black.  Abdominal pain.  A change in bowel habits, such as constipation or diarrhea. How is this diagnosed? This condition is diagnosed with a colonoscopy. This is a procedure in which a lighted, flexible scope is inserted into the anus and then passed into the colon to examine the area. Polyps are sometimes found when a colonoscopy is done as part of routine cancer screening tests. How is this treated? Treatment for this condition involves removing any polyps that are found. Most polyps can be removed during a colonoscopy. Those polyps will then be tested for cancer. Additional treatment may be needed depending on the results of testing. Follow these instructions at home: Lifestyle  Maintain a healthy weight, or lose weight if recommended by your health care provider.  Exercise every day or as told by your health care provider.  Do not use any products that contain nicotine or tobacco, such as cigarettes and e-cigarettes. If you need help quitting, ask your health care provider.  If you drink alcohol, limit how much you have: ? 0-1 drink a day for women. ? 0-2 drinks a day for men.  Be aware of how much alcohol is in your drink. In the [place] one drink equals one 12 oz bottle of beer (355 mL), one 5 oz glass of wine (148 mL), or one 1 oz shot of hard liquor (44 mL). Eating and drinking   Eat foods that are high in fiber, such as fruits, vegetables, and whole grains.  Eat foods that are high in calcium and vitamin D, such as milk, cheese, yogurt, eggs, liver, fish, and broccoli.  Limit foods that are high in fat, such as fried foods and desserts.  Limit the amount of red meat and  processed meat you eat, such as hot dogs, sausage, bacon, and lunch meats. General instructions  Keep all follow-up visits as told by your health care provider. This is important. ? This includes having regularly scheduled colonoscopies. ? Talk to your health care provider about when you need a colonoscopy. Contact a health care provider if:  You have new or worsening bleeding during a bowel movement.  You have new or increased blood in your stool.  You have a change in bowel habits.  You lose weight for no known reason. Summary  Polyps are tissue growths inside the body. Polyps can grow in many places, including the colon.  Most colon polyps are noncancerous (benign), but some can become cancerous over time.  This condition is diagnosed with a colonoscopy.  Treatment for this condition involves removing any polyps that are found. Most polyps can be removed during  a colonoscopy. This information is not intended to replace advice given to you by your health care provider. Make sure you discuss any questions you have with your health care provider. Document Released: 05/24/2004 Document Revised: 12/13/2017 Document Reviewed: 12/13/2017 Elsevier Interactive Patient Education  2019 [employer].

## 2018-11-22 ENCOUNTER — Encounter: Payer: Self-pay | Admitting: Family Medicine

## 2018-11-26 ENCOUNTER — Encounter (HOSPITAL_COMMUNITY): Payer: Self-pay | Admitting: Internal Medicine

## 2018-12-04 ENCOUNTER — Ambulatory Visit: Payer: Medicare Other

## 2019-01-15 ENCOUNTER — Encounter (HOSPITAL_COMMUNITY): Payer: Self-pay | Admitting: Internal Medicine

## 2019-03-27 ENCOUNTER — Ambulatory Visit (INDEPENDENT_AMBULATORY_CARE_PROVIDER_SITE_OTHER): Payer: Medicare Other | Admitting: Family Medicine

## 2019-03-27 ENCOUNTER — Encounter: Payer: Self-pay | Admitting: Family Medicine

## 2019-03-27 ENCOUNTER — Other Ambulatory Visit: Payer: Self-pay

## 2019-03-27 ENCOUNTER — Ambulatory Visit: Payer: Medicare Other | Admitting: Family Medicine

## 2019-03-27 VITALS — BP 151/62 | HR 64 | Wt 136.0 lb

## 2019-03-27 DIAGNOSIS — I10 Essential (primary) hypertension: Secondary | ICD-10-CM | POA: Diagnosis not present

## 2019-03-27 DIAGNOSIS — N182 Chronic kidney disease, stage 2 (mild): Secondary | ICD-10-CM | POA: Diagnosis not present

## 2019-03-27 DIAGNOSIS — R7303 Prediabetes: Secondary | ICD-10-CM

## 2019-03-27 DIAGNOSIS — E7800 Pure hypercholesterolemia, unspecified: Secondary | ICD-10-CM

## 2019-03-27 DIAGNOSIS — F17200 Nicotine dependence, unspecified, uncomplicated: Secondary | ICD-10-CM

## 2019-03-27 MED ORDER — HYDROCHLOROTHIAZIDE 25 MG PO TABS: 25.0000 mg | ORAL_TABLET | Freq: Every day | ORAL | 1 refills | Status: DC

## 2019-03-27 MED ORDER — ATORVASTATIN CALCIUM 40 MG PO TABS: 40.0000 mg | ORAL_TABLET | Freq: Every day | ORAL | 1 refills | Status: DC

## 2019-03-27 NOTE — Progress Notes (Signed)
Virtual Visit via Video Note  I connected with@  on 03/27/19 at  1:30 PM EDT by a video enabled telemedicine application and verified that I am speaking with the correct person using two identifiers.  Location patient: home Location provider:work or home office Persons participating in the virtual visit: patient, provider  I discussed the limitations of evaluation and management by telemedicine and the availability of in person appointments. The patient expressed understanding and agreed to proceed.  Telemedicine visit is a necessity given the COVID-19 restrictions in place at the current time.  HPI: Patient is a 77 y.o. Caucasian male who presents for 7 mo f/u HTN, HLD, CRI II/III with GFR about 60ml/min, tob dependence.   All labs at his CPE visit 09/2018 were excellent.  Interim hx: Says he feels well, very busy at home doing projects.  He and his whole family have been very fearful of covid 19 so they essentially never leave the home and he is not comfortable coming up to our office to get labs at this time.  Drinks cokes a lot, no water!  Takes goodies powders regularly for various aches and pains. Still smoking and is open to quitting but has failed smoking cessation aids like bupropion, chantix, and nicotine patches.  He declines trial of med again, wants to work on quitting cold turkey but said with everything he has going on he is unlikely to do this anytime soon. As per his usual, he never eats very much, particularly in this hot weather and being so busy with home renovation projects.   He is compliant with all meds and tolerates them fine.  Not checking bp at home at all. Has a bp cuff.  ROS: no CP, no SOB, no wheezing, no cough, no dizziness, no HAs, no rashes, no melena/hematochezia.  No polyuria or polydipsia.    Past Medical History:  Diagnosis Date  . Adenomatous colon polyp    Dr. [name] (28 polyps on 1st endo, 3 on 2nd endo a year later.  8 on colonoscopy  02/26/14--recall 3 yrs.  Multiple adenomatous polyps on 09/20/17 TCS--recall 1 yr.. 11/2018 +aden pol->recall 3 yrs.  . Aortic insufficiency    echo 03/25/12-EF>55%, mild-mod Aortic regurg, sclerotic aortic valve,   . BPH (benign prostatic hyperplasia)    Dr. Jaived (pt is asymptomatic)  . Carotid artery occlusion    carotid dopplers 02/06/12-patent left carotid endarterectomy; right internal carotid 40-59%.  Repeat dopplers 03/2017, 40-59% stenosed bilat int carot--per vasc needs rpt 9 mo.  . Chronic renal insufficiency, stage II (mild)    Borderline stage II/III--CrCl @ 62.  No RAS on 2003 angiogram (done for + renal artery dopplers--deemed eventually to have been a false positive).  Dr. Berry.  . DDD (degenerative disc disease), lumbar 02/2013   with spondylosis and moderate spinal stenosis+ left L5 and S1 nerve root impingement  . Ectatic abdominal aorta ([facility]) 08/08/2016   2.8 cm at widest point: repeat aortic u/s 08/2021.  . Essential tremor   . Fatty liver 2006   Noted on abd u/s and renal u/s 2006/2007  . History of hyperkalemia 02/2017   mild; recommended pt cut his ARB in half.  . History of MI (myocardial infarction)    Large fixed inferior defect on myocardial perfusion imaging 03/2017.  . Hyperlipidemia    Dr. Hochrein started statin 03/2017.    . Hypertension   . PAD (peripheral artery disease) ([facility])    carotids: Dr. Hochrein started pt on statin for CV   risk reduction 03/2017  . Plantar wart of left foot 12/21/2012  . Prediabetes 2019   A1c 5.9%  . SOB (shortness of breath)    MET test 03/25/12-mildly abnormal  . Solitary pulmonary nodule 03/14/2013   7mm RUL 03/08/13--stable on repeat 07/2013, 03/2015, and 01/2016.  No additional imaging is required.  . Tobacco dependence    ongoing as of 02/2016  . Vitamin B12 deficiency without anemia 2016   Intrinsic factor NEG: home vit B12 injections started 11/17/14    Past Surgical History:  Procedure Laterality Date  . APPENDECTOMY  remote   . Arm surgery  July 2010   Left arm cyst/Lipoma  . CARDIOVASCULAR STRESS TEST  03/2017   Low risk study (likely artifact seen, but no reversible ischemia, EF 59%, no wall motion abnormality.  . carotid dopplers  02/2015; 02/2016;12/2017   2017:  R ICA 1-39%, L ICA 40-59%: no change from 2016. 03/2017 rpt 40-59% bilat ICA stenosis--vasc recommended rpt 9 mo.  12/2017 1-39% bilat ICA stenosis.  Repeat 1 yr.  . CAROTID ENDARTERECTOMY  11/25/08   Left     ICA  . COLONOSCOPY N/A 02/26/2014   Recall 3 yrs: (+ polypectomy--tubular adenoma) Procedure: COLONOSCOPY;  Surgeon: [name] [name], MD;  [facility];  Service: Endoscopy;  Laterality: N/A;  830  . COLONOSCOPY N/A 09/20/2017   Multiple adenomatous polyps: recall 1 yr.  Procedure: COLONOSCOPY;  Surgeon: [name], [name], MD;  [facility];  Service: Endoscopy;  Laterality: N/A;  730  . COLONOSCOPY N/A 11/21/2018   Procedure: COLONOSCOPY;  Surgeon: [name], [name], MD;  [facility];  Service: Endoscopy;  Laterality: N/A;  930  . COLONOSCOPY W/ POLYPECTOMY  09/20/2017; 11/21/18   2019 Adenomatous polyp: recall 3-5 yrs (Dr. [name]).  11/2018-adenomatous polyp->recall 3 yrs  . POLYPECTOMY  09/20/2017   Procedure: POLYPECTOMY;  Surgeon: [name], [name], MD;  [facility];  Service: Endoscopy;;  Ascending colon, Transverse colon, Hepatic flexure, Cecum  . POLYPECTOMY  11/21/2018   Procedure: POLYPECTOMY;  Surgeon: [name], [name], MD;  [facility];  Service: Endoscopy;;  . TONSILLECTOMY  remote  . TONSILLECTOMY    . TRANSTHORACIC ECHOCARDIOGRAM  03/2012   2013 --EF 55%, normal LV syst fxn, impaired diast relaxation, mild/mod aortic regurg.  . UMBILICAL HERNIA REPAIR      Family History  Problem Relation Age of Onset  . Coronary artery disease Mother   . Stroke Mother   . Diabetes Mother   . Hypertension Mother   . Parkinson's disease Mother   . Stroke Sister   . Cancer Brother   . Heart  disease Brother   . Colon cancer Neg Hx      Current Outpatient Medications:  .  aspirin EC 81 MG tablet, Take 1 tablet (81 mg total) by mouth at bedtime., Disp: , Rfl:  .  atorvastatin (LIPITOR) 40 MG tablet, Take 1 tablet (40 mg total) by mouth daily. (Patient taking differently: Take 40 mg by mouth at bedtime. ), Disp: 30 tablet, Rfl: 5 .  GOODYS EXTRA STRENGTH 500-325-65 MG PACK, Take 1 packet by mouth daily as needed (pain.)., Disp: , Rfl:  .  hydrochlorothiazide (HYDRODIURIL) 25 MG tablet, Take 1 tablet (25 mg total) by mouth daily. (Patient taking differently: Take 25 mg by mouth at bedtime. ), Disp: 30 tablet, Rfl: 5 .  Multiple Vitamins-Minerals (MULTIVITAMIN ADULT) TABS, Take by mouth daily., Disp: , Rfl:  .  cyanocobalamin (,  VITAMIN B-12,) 1000 MCG/ML injection, Inject 1,000 mcg into the muscle every 30 (thirty) days., Disp: , Rfl:   EXAM:  VITALS per patient if applicable: BP (!) 578/46 (BP Location: Left Arm, Patient Position: Sitting, Cuff Size: Normal)   Pulse 64   Wt 136 lb (61.7 kg)   BMI 20.08 kg/m   Repeat bp 126/68--upper arm cuff.  P 77.   GENERAL: alert, oriented, appears well and in no acute distress  HEENT: atraumatic, conjunttiva clear, no obvious abnormalities on inspection of external nose and ears  NECK: normal movements of the head and neck  LUNGS: on inspection no signs of respiratory distress, breathing rate appears normal, no obvious gross SOB, gasping or wheezing  CV: no obvious cyanosis  MS: moves all visible extremities without noticeable abnormality  PSYCH/NEURO: pleasant and cooperative, no obvious depression or anxiety, speech and thought processing grossly intact  LABS: none today  Lab Results  Component Value Date   TSH 3.96 12/10/2017   Lab Results  Component Value Date   WBC 9.5 09/26/2018   HGB 15.5 09/26/2018   HCT 45.2 09/26/2018   MCV 86.5 09/26/2018   PLT 230.0 09/26/2018   Lab Results  Component Value Date    CREATININE 1.22 09/26/2018   BUN 11 09/26/2018   NA 137 09/26/2018   K 4.7 09/26/2018   CL 99 09/26/2018   CO2 33 (H) 09/26/2018   Lab Results  Component Value Date   ALT 7 09/26/2018   AST 12 09/26/2018   ALKPHOS 88 09/26/2018   BILITOT 0.6 09/26/2018   Lab Results  Component Value Date   CHOL 156 09/26/2018   Lab Results  Component Value Date   HDL 44.40 09/26/2018   Lab Results  Component Value Date   LDLCALC 88 09/26/2018   Lab Results  Component Value Date   TRIG 118.0 09/26/2018   Lab Results  Component Value Date   CHOLHDL 4 09/26/2018   Lab Results  Component Value Date   PSA 0.85 12/23/2012   Lab Results  Component Value Date   HGBA1C 5.9 12/10/2017    ASSESSMENT AND PLAN:  Discussed the following assessment and plan:  1) HTN: one measurement up today.  Not monitoring at home. I asked him to check his bp/hr daily for 10 d and call these numbers in to my nurse so I can review them. No med changes today. He will come in for BMET when he feels safer regarding the covid 19d situation.  2) HLD: tolerating statin.   FLP--future.  3) prediabetes: not working on diet at all. He is very active/busy with physical labor but does no formal/structured exercise. HbA1c --future.  4) CRI, GFR @ 60 ml/min:  Needs to cut back on colas and increase intake of water. Needs to stop goodies powders and use tylenol only for any pains. BMET -future.  5) Tob dependence: encouraged total cessation but pt not motivated to quit right now.  I discussed the assessment and treatment plan with the patient. The patient was provided an opportunity to ask questions and all were answered. The patient agreed with the plan and demonstrated an understanding of the instructions.   The patient was advised to call back or seek an in-person evaluation if the symptoms worsen or if the condition fails to improve as anticipated.  F/u: 6 mo cpe  Signed:  Crissie Sickles, MD            03/27/2019

## 2019-04-08 ENCOUNTER — Telehealth: Payer: Self-pay | Admitting: Family Medicine

## 2019-04-08 NOTE — Telephone Encounter (Signed)
Pt wife called to report the Pt's BP readings for 10 days and to let Dr. Anitra Lauth know he is trying to cut down on cigarettes.   BP readings and pulse 20 minutes apart  7.18.20 - 117/60 P-69 and 128/52 P-59 7.19.20 - 130/60 P-54 and 128/60 P-49 7.20.20 - 127/76 P-40 and 106/59 P-89 7.21.20 - 146/62 P-65 and 142/68 P-53 7.22.20 - 153/62 P-47 and 139/64 P-50 7.23.20 - 128/65 P-70 and 126/63 P-73 7.24.20 - 129/66 P-57 and 133/69 P-54 7.25.20 - 129/59 P-54 and 118/62 P-58 7.26.20 - 122/65 P-63 and 106/62 P-67

## 2019-04-08 NOTE — Telephone Encounter (Signed)
Tried contacting patient, unable to get through to phone number.  Will try again at a later time.

## 2019-04-08 NOTE — Telephone Encounter (Signed)
Patients wife aware

## 2019-04-08 NOTE — Telephone Encounter (Signed)
OK, these bp's are good. No new recommendations.-thx

## 2019-04-10 ENCOUNTER — Ambulatory Visit (INDEPENDENT_AMBULATORY_CARE_PROVIDER_SITE_OTHER): Payer: Medicare Other | Admitting: Family Medicine

## 2019-04-10 ENCOUNTER — Other Ambulatory Visit: Payer: Self-pay

## 2019-04-10 DIAGNOSIS — E538 Deficiency of other specified B group vitamins: Secondary | ICD-10-CM | POA: Diagnosis not present

## 2019-04-10 MED ORDER — CYANOCOBALAMIN 1000 MCG/ML IJ SOLN
1000.0000 ug | Freq: Once | INTRAMUSCULAR | Status: AC
  Administered 2019-04-10: 1000 ug via INTRAMUSCULAR

## 2019-04-10 NOTE — Progress Notes (Signed)
Jeffrey [name] is a 78 y.o. male presents to the office today for Vitamn B12 injections, per physician's orders.  Vitamin B12 1029mcg IM was administered left arm today. Patient tolerated injection.  Patient next injection due: 05/09/2019, appt made No  Magalie Almon A.,CMA

## 2019-04-28 NOTE — Progress Notes (Signed)
Pt with vit B12 deficiency.  Agree with vit B12 1000 mcg IM in office today. Signed:  Crissie Sickles, MD           04/28/2019

## 2019-05-16 ENCOUNTER — Emergency Department (HOSPITAL_COMMUNITY): Payer: Medicare Other | Admitting: Certified Registered"

## 2019-05-16 ENCOUNTER — Encounter (HOSPITAL_COMMUNITY)
Admission: EM | Disposition: A | Discharge status: Home / Self Care | Payer: Self-pay | Source: Home / Self Care | Attending: Emergency Medicine

## 2019-05-16 ENCOUNTER — Encounter (HOSPITAL_COMMUNITY): Payer: Self-pay

## 2019-05-16 ENCOUNTER — Ambulatory Visit (HOSPITAL_COMMUNITY)
Admission: EM | Admit: 2019-05-16 | Discharge: 2019-05-16 | Disposition: A | Discharge status: Home / Self Care | Payer: Medicare Other | Attending: Emergency Medicine | Admitting: Emergency Medicine

## 2019-05-16 ENCOUNTER — Other Ambulatory Visit: Payer: Self-pay

## 2019-05-16 ENCOUNTER — Emergency Department (HOSPITAL_COMMUNITY): Payer: Medicare Other

## 2019-05-16 DIAGNOSIS — I129 Hypertensive chronic kidney disease with stage 1 through stage 4 chronic kidney disease, or unspecified chronic kidney disease: Secondary | ICD-10-CM | POA: Insufficient documentation

## 2019-05-16 DIAGNOSIS — Z79899 Other long term (current) drug therapy: Secondary | ICD-10-CM | POA: Insufficient documentation

## 2019-05-16 DIAGNOSIS — I739 Peripheral vascular disease, unspecified: Secondary | ICD-10-CM | POA: Diagnosis not present

## 2019-05-16 DIAGNOSIS — E785 Hyperlipidemia, unspecified: Secondary | ICD-10-CM | POA: Insufficient documentation

## 2019-05-16 DIAGNOSIS — S62633B Displaced fracture of distal phalanx of left middle finger, initial encounter for open fracture: Secondary | ICD-10-CM | POA: Insufficient documentation

## 2019-05-16 DIAGNOSIS — S62631B Displaced fracture of distal phalanx of left index finger, initial encounter for open fracture: Secondary | ICD-10-CM | POA: Diagnosis present

## 2019-05-16 DIAGNOSIS — I6523 Occlusion and stenosis of bilateral carotid arteries: Secondary | ICD-10-CM | POA: Diagnosis not present

## 2019-05-16 DIAGNOSIS — N182 Chronic kidney disease, stage 2 (mild): Secondary | ICD-10-CM | POA: Insufficient documentation

## 2019-05-16 DIAGNOSIS — Z7982 Long term (current) use of aspirin: Secondary | ICD-10-CM | POA: Insufficient documentation

## 2019-05-16 DIAGNOSIS — R7303 Prediabetes: Secondary | ICD-10-CM | POA: Diagnosis not present

## 2019-05-16 DIAGNOSIS — I252 Old myocardial infarction: Secondary | ICD-10-CM | POA: Insufficient documentation

## 2019-05-16 DIAGNOSIS — S6990XA Unspecified injury of unspecified wrist, hand and finger(s), initial encounter: Secondary | ICD-10-CM

## 2019-05-16 DIAGNOSIS — S68625A Partial traumatic transphalangeal amputation of left ring finger, initial encounter: Secondary | ICD-10-CM | POA: Diagnosis not present

## 2019-05-16 DIAGNOSIS — F17210 Nicotine dependence, cigarettes, uncomplicated: Secondary | ICD-10-CM | POA: Diagnosis not present

## 2019-05-16 DIAGNOSIS — Z01818 Encounter for other preprocedural examination: Secondary | ICD-10-CM

## 2019-05-16 DIAGNOSIS — W312XXA Contact with powered woodworking and forming machines, initial encounter: Secondary | ICD-10-CM | POA: Diagnosis not present

## 2019-05-16 DIAGNOSIS — E538 Deficiency of other specified B group vitamins: Secondary | ICD-10-CM | POA: Insufficient documentation

## 2019-05-16 HISTORY — PX: AMPUTATION FINGER: SHX6594

## 2019-05-16 HISTORY — PX: NAILBED REPAIR: SHX5028

## 2019-05-16 HISTORY — PX: OPEN REDUCTION INTERNAL FIXATION (ORIF) HAND: SHX5991

## 2019-05-16 HISTORY — PX: I & D EXTREMITY: SHX5045

## 2019-05-16 LAB — CBC WITH DIFFERENTIAL/PLATELET
Abs Immature Granulocytes: 0.07 10*3/uL (ref 0.00–0.07)
Basophils Absolute: 0.1 10*3/uL (ref 0.0–0.1)
Basophils Relative: 1 %
Eosinophils Absolute: 0.2 10*3/uL (ref 0.0–0.5)
Eosinophils Relative: 3 %
HCT: 41.9 % (ref 39.0–52.0)
Hemoglobin: 13.7 g/dL (ref 13.0–17.0)
Immature Granulocytes: 1 %
Lymphocytes Relative: 21 %
Lymphs Abs: 1.8 10*3/uL (ref 0.7–4.0)
MCH: 29.4 pg (ref 26.0–34.0)
MCHC: 32.7 g/dL (ref 30.0–36.0)
MCV: 89.9 fL (ref 80.0–100.0)
Monocytes Absolute: 0.8 10*3/uL (ref 0.1–1.0)
Monocytes Relative: 9 %
Neutro Abs: 5.5 10*3/uL (ref 1.7–7.7)
Neutrophils Relative %: 65 %
Platelets: 211 10*3/uL (ref 150–400)
RBC: 4.66 MIL/uL (ref 4.22–5.81)
RDW: 13.4 % (ref 11.5–15.5)
WBC: 8.5 10*3/uL (ref 4.0–10.5)
nRBC: 0 % (ref 0.0–0.2)

## 2019-05-16 LAB — TYPE AND SCREEN
ABO/RH(D): O POS
Antibody Screen: NEGATIVE

## 2019-05-16 LAB — BASIC METABOLIC PANEL
Anion gap: 10 (ref 5–15)
BUN: 11 mg/dL (ref 8–23)
CO2: 26 mmol/L (ref 22–32)
Calcium: 9.2 mg/dL (ref 8.9–10.3)
Chloride: 102 mmol/L (ref 98–111)
Creatinine, Ser: 1.35 mg/dL — ABNORMAL HIGH (ref 0.61–1.24)
GFR calc Af Amer: 59 mL/min — ABNORMAL LOW (ref >60)
GFR calc non Af Amer: 51 mL/min — ABNORMAL LOW (ref >60)
Glucose, Bld: 104 mg/dL — ABNORMAL HIGH (ref 70–99)
Potassium: 3.8 mmol/L (ref 3.5–5.1)
Sodium: 138 mmol/L (ref 135–145)

## 2019-05-16 LAB — SARS CORONAVIRUS 2 BY RT PCR (HOSPITAL ORDER, PERFORMED IN ~~LOC~~ HOSPITAL LAB): SARS Coronavirus 2: NEGATIVE

## 2019-05-16 LAB — GLUCOSE, CAPILLARY: Glucose-Capillary: 101 mg/dL — ABNORMAL HIGH (ref 70–99)

## 2019-05-16 SURGERY — IRRIGATION AND DEBRIDEMENT EXTREMITY
Anesthesia: General | Site: Hand | Laterality: Left

## 2019-05-16 MED ORDER — FENTANYL CITRATE (PF) 250 MCG/5ML IJ SOLN
INTRAMUSCULAR | Status: AC
  Filled 2019-05-16: qty 5

## 2019-05-16 MED ORDER — SODIUM CHLORIDE 0.9 % IR SOLN
Status: DC | PRN
  Administered 2019-05-16 (×2): 3000 mL

## 2019-05-16 MED ORDER — PROPOFOL 10 MG/ML IV BOLUS
INTRAVENOUS | Status: AC
  Filled 2019-05-16: qty 20

## 2019-05-16 MED ORDER — LIDOCAINE 2% (20 MG/ML) 5 ML SYRINGE
INTRAMUSCULAR | Status: DC | PRN
  Administered 2019-05-16: 60 mg via INTRAVENOUS

## 2019-05-16 MED ORDER — DEXAMETHASONE SODIUM PHOSPHATE 10 MG/ML IJ SOLN
INTRAMUSCULAR | Status: DC | PRN
  Administered 2019-05-16: 10 mg via INTRAVENOUS

## 2019-05-16 MED ORDER — PROPOFOL 10 MG/ML IV BOLUS
INTRAVENOUS | Status: DC | PRN
  Administered 2019-05-16: 100 mg via INTRAVENOUS

## 2019-05-16 MED ORDER — HYDROCODONE-ACETAMINOPHEN 5-325 MG PO TABS: 1.0000 | ORAL_TABLET | ORAL | 0 refills | Status: DC | PRN

## 2019-05-16 MED ORDER — PHENYLEPHRINE 40 MCG/ML (10ML) SYRINGE FOR IV PUSH (FOR BLOOD PRESSURE SUPPORT)
PREFILLED_SYRINGE | INTRAVENOUS | Status: DC | PRN
  Administered 2019-05-16 (×2): 80 ug via INTRAVENOUS

## 2019-05-16 MED ORDER — SODIUM BICARBONATE 8.4 % IV SOLN
INTRAVENOUS | Status: AC
  Filled 2019-05-16: qty 50

## 2019-05-16 MED ORDER — SODIUM CHLORIDE 0.9 % IV SOLN
INTRAVENOUS | Status: DC | PRN
  Administered 2019-05-16: 21:00:00 20 ug/min via INTRAVENOUS

## 2019-05-16 MED ORDER — HYDROMORPHONE HCL 1 MG/ML IJ SOLN: 0.2500 mg | INTRAMUSCULAR | Status: DC | PRN

## 2019-05-16 MED ORDER — CEPHALEXIN 500 MG PO CAPS: 500.0000 mg | ORAL_CAPSULE | Freq: Four times a day (QID) | ORAL | 0 refills | Status: AC

## 2019-05-16 MED ORDER — BUPIVACAINE HCL (PF) 0.25 % IJ SOLN
INTRAMUSCULAR | Status: DC | PRN
  Administered 2019-05-16: 20 mL

## 2019-05-16 MED ORDER — EPHEDRINE SULFATE 50 MG/ML IJ SOLN
INTRAMUSCULAR | Status: DC | PRN
  Administered 2019-05-16: 10 mg via INTRAVENOUS

## 2019-05-16 MED ORDER — ONDANSETRON HCL 4 MG/2ML IJ SOLN
INTRAMUSCULAR | Status: DC | PRN
  Administered 2019-05-16: 4 mg via INTRAVENOUS

## 2019-05-16 MED ORDER — MORPHINE SULFATE (PF) 4 MG/ML IV SOLN
4.0000 mg | Freq: Once | INTRAVENOUS | Status: AC
  Administered 2019-05-16: 19:00:00 4 mg via INTRAVENOUS

## 2019-05-16 MED ORDER — FENTANYL CITRATE (PF) 250 MCG/5ML IJ SOLN
INTRAMUSCULAR | Status: DC | PRN
  Administered 2019-05-16 (×3): 50 ug via INTRAVENOUS

## 2019-05-16 MED ORDER — ALBUMIN HUMAN 5 % IV SOLN
INTRAVENOUS | Status: DC | PRN
  Administered 2019-05-16: 20:00:00 via INTRAVENOUS

## 2019-05-16 MED ORDER — GLYCOPYRROLATE 0.2 MG/ML IJ SOLN
INTRAMUSCULAR | Status: DC | PRN
  Administered 2019-05-16: 0.2 mg via INTRAVENOUS

## 2019-05-16 MED ORDER — LACTATED RINGERS IV SOLN
INTRAVENOUS | Status: DC
  Administered 2019-05-16 (×2): via INTRAVENOUS

## 2019-05-16 MED ORDER — CEFAZOLIN SODIUM-DEXTROSE 1-4 GM/50ML-% IV SOLN
1.0000 g | Freq: Once | INTRAVENOUS | Status: AC
  Administered 2019-05-16: 1 g via INTRAVENOUS
  Filled 2019-05-16: qty 50

## 2019-05-16 MED ORDER — 0.9 % SODIUM CHLORIDE (POUR BTL) OPTIME
TOPICAL | Status: DC | PRN
  Administered 2019-05-16: 20:00:00 1000 mL

## 2019-05-16 MED ORDER — SUCCINYLCHOLINE CHLORIDE 200 MG/10ML IV SOSY
PREFILLED_SYRINGE | INTRAVENOUS | Status: DC | PRN
  Administered 2019-05-16: 100 mg via INTRAVENOUS

## 2019-05-16 MED ORDER — MORPHINE SULFATE (PF) 4 MG/ML IV SOLN
INTRAVENOUS | Status: AC
  Administered 2019-05-16: 4 mg via INTRAVENOUS
  Filled 2019-05-16: qty 1

## 2019-05-16 MED ORDER — BUPIVACAINE HCL (PF) 0.25 % IJ SOLN
INTRAMUSCULAR | Status: AC
  Filled 2019-05-16: qty 30

## 2019-05-16 MED ORDER — TETANUS-DIPHTH-ACELL PERTUSSIS 5-2.5-18.5 LF-MCG/0.5 IM SUSP
0.5000 mL | Freq: Once | INTRAMUSCULAR | Status: AC
  Administered 2019-05-16: 0.5 mL via INTRAMUSCULAR
  Filled 2019-05-16: qty 0.5

## 2019-05-16 SURGICAL SUPPLY — 67 items
BANDAGE ELASTIC 3 VELCRO ST LF (GAUZE/BANDAGES/DRESSINGS) ×2 IMPLANT
BNDG COHESIVE 1X5 TAN STRL LF (GAUZE/BANDAGES/DRESSINGS) IMPLANT
BNDG CONFORM 2 STRL LF (GAUZE/BANDAGES/DRESSINGS) IMPLANT
BNDG ELASTIC 3X5.8 VLCR STR LF (GAUZE/BANDAGES/DRESSINGS) IMPLANT
BNDG ELASTIC 4X5.8 VLCR STR LF (GAUZE/BANDAGES/DRESSINGS) ×5 IMPLANT
BNDG GAUZE ELAST 4 BULKY (GAUZE/BANDAGES/DRESSINGS) ×11 IMPLANT
CORD BIPOLAR FORCEPS 12FT (ELECTRODE) ×5 IMPLANT
COVER SURGICAL LIGHT HANDLE (MISCELLANEOUS) ×5 IMPLANT
COVER WAND RF STERILE (DRAPES) ×5 IMPLANT
CUFF TOURN SGL QUICK 18X4 (TOURNIQUET CUFF) ×5 IMPLANT
CUFF TOURN SGL QUICK 24 (TOURNIQUET CUFF)
CUFF TRNQT CYL 24X4X16.5-23 (TOURNIQUET CUFF) IMPLANT
DECANTER SPIKE VIAL GLASS SM (MISCELLANEOUS) ×7 IMPLANT
DRAPE C-ARM MINI 42X72 WSTRAPS (DRAPES) ×2 IMPLANT
DRAPE SURG 17X23 STRL (DRAPES) ×5 IMPLANT
DRSG ADAPTIC 3X8 NADH LF (GAUZE/BANDAGES/DRESSINGS) ×5 IMPLANT
DRSG MEPITEL 4X7.2 (GAUZE/BANDAGES/DRESSINGS) ×2 IMPLANT
GAUZE SPONGE 2X2 8PLY STRL LF (GAUZE/BANDAGES/DRESSINGS) IMPLANT
GAUZE SPONGE 4X4 12PLY STRL (GAUZE/BANDAGES/DRESSINGS) ×5 IMPLANT
GAUZE SPONGE 4X4 12PLY STRL LF (GAUZE/BANDAGES/DRESSINGS) ×2 IMPLANT
GAUZE XEROFORM 1X8 LF (GAUZE/BANDAGES/DRESSINGS) ×3 IMPLANT
GAUZE XEROFORM 5X9 LF (GAUZE/BANDAGES/DRESSINGS) ×2 IMPLANT
GLOVE BIOGEL M 8.0 STRL (GLOVE) ×3 IMPLANT
GLOVE SS BIOGEL STRL SZ 8 (GLOVE) ×3 IMPLANT
GLOVE SUPERSENSE BIOGEL SZ 8 (GLOVE) ×2
GOWN STRL REUS W/ TWL LRG LVL3 (GOWN DISPOSABLE) ×6 IMPLANT
GOWN STRL REUS W/ TWL XL LVL3 (GOWN DISPOSABLE) ×9 IMPLANT
GOWN STRL REUS W/TWL LRG LVL3 (GOWN DISPOSABLE) ×4
GOWN STRL REUS W/TWL XL LVL3 (GOWN DISPOSABLE) ×6
K-WIRE DBL TROCAR .045X4 ×5 IMPLANT
KIT BASIN OR (CUSTOM PROCEDURE TRAY) ×5 IMPLANT
KIT TURNOVER KIT B (KITS) ×5 IMPLANT
KWIRE DBL TROCAR .045X4 IMPLANT
LOOP VESSEL MAXI BLUE (MISCELLANEOUS) IMPLANT
MANIFOLD NEPTUNE II (INSTRUMENTS) ×5 IMPLANT
NDL HYPO 25GX1X1/2 BEV (NEEDLE) IMPLANT
NEEDLE HYPO 25GX1X1/2 BEV (NEEDLE) ×5 IMPLANT
NS IRRIG 1000ML POUR BTL (IV SOLUTION) ×5 IMPLANT
PACK ORTHO EXTREMITY (CUSTOM PROCEDURE TRAY) ×5 IMPLANT
PAD ARMBOARD 7.5X6 YLW CONV (MISCELLANEOUS) ×10 IMPLANT
PAD CAST 4YDX4 CTTN HI CHSV (CAST SUPPLIES) ×3 IMPLANT
PADDING CAST COTTON 4X4 STRL (CAST SUPPLIES) ×2
PADDING CAST SYNTHETIC 4 (CAST SUPPLIES) ×2
PADDING CAST SYNTHETIC 4X4 STR (CAST SUPPLIES) IMPLANT
SET CYSTO W/LG BORE CLAMP LF (SET/KITS/TRAYS/PACK) ×5 IMPLANT
SOL PREP POV-IOD 4OZ 10% (MISCELLANEOUS) ×20 IMPLANT
SPEAR EYE SURG WECK-CEL (MISCELLANEOUS) IMPLANT
SPECIMEN JAR SMALL (MISCELLANEOUS) ×3 IMPLANT
SPLINT FIBERGLASS 3X12 (CAST SUPPLIES) ×2 IMPLANT
SPONGE GAUZE 2X2 STER 10/PKG (GAUZE/BANDAGES/DRESSINGS)
SPONGE LAP 4X18 RFD (DISPOSABLE) ×3 IMPLANT
SUT CHROMIC 4 0 P 3 18 (SUTURE) ×4 IMPLANT
SUT CHROMIC 5 0 P 3 (SUTURE) ×4 IMPLANT
SUT MERSILENE 4 0 P 3 (SUTURE) IMPLANT
SUT PROLENE 4 0 PS 2 18 (SUTURE) IMPLANT
SUT VIC AB 2-0 CT1 27 (SUTURE)
SUT VIC AB 2-0 CT1 TAPERPNT 27 (SUTURE) IMPLANT
SWAB CULTURE ESWAB REG 1ML (MISCELLANEOUS) IMPLANT
SYR CONTROL 10ML LL (SYRINGE) ×2 IMPLANT
TOWEL GREEN STERILE (TOWEL DISPOSABLE) ×5 IMPLANT
TOWEL GREEN STERILE FF (TOWEL DISPOSABLE) ×5 IMPLANT
TUBE CONNECTING 12'X1/4 (SUCTIONS) ×1
TUBE CONNECTING 12X1/4 (SUCTIONS) ×4 IMPLANT
UNDERPAD 30X30 (UNDERPADS AND DIAPERS) ×5 IMPLANT
WATER STERILE IRR 1000ML POUR (IV SOLUTION) ×5 IMPLANT
YANKAUER SUCT BULB TIP NO VENT (SUCTIONS) ×5 IMPLANT
kwire IMPLANT

## 2019-05-16 NOTE — Transfer of Care (Signed)
Immediate Anesthesia Transfer of Care Note  Patient: Jeffrey [name]  Procedure(s) Performed: IRRIGATION AND DEBRIDEMENT OF LEFT INDEX, MIDDLE,FOURTH FINGERS (Left Hand) Revision Amutation of Left  fourth finger (Left Finger) Reconstruction and Open Reduction and Internal fixation and Rotation flap Left Middle finger (Left Finger) Reconstruction of bone and nail bed Left Index finger (Left Finger)  Patient Location: PACU  Anesthesia Type:General  Level of Consciousness: awake, alert , oriented and patient cooperative  Airway & Oxygen Therapy: Patient Spontanous Breathing and Patient connected to nasal cannula oxygen  Post-op Assessment: Report given to RN and Post -op Vital signs reviewed and stable  Post vital signs: Reviewed and stable  Last Vitals:  Vitals Value Taken Time  BP 160/94 05/16/19 2128  Temp    Pulse 75 05/16/19 2129  Resp 17 05/16/19 2129  SpO2 97 % 05/16/19 2129  Vitals shown include unvalidated device data.  Last Pain:  Vitals:   05/16/19 1702  TempSrc:   PainSc: 5          Complications: No apparent anesthesia complications

## 2019-05-16 NOTE — Anesthesia Postprocedure Evaluation (Signed)
Anesthesia Post Note  Patient: ESHAUN MANNE  Procedure(s) Performed: IRRIGATION AND DEBRIDEMENT OF LEFT INDEX, MIDDLE,FOURTH FINGERS (Left Hand) Revision Amutation of Left  fourth finger (Left Finger) Reconstruction and Open Reduction and Internal fixation and Rotation flap Left Middle finger (Left Finger) Reconstruction of bone and nail bed Left Index finger (Left Finger)     Patient location during evaluation: PACU Anesthesia Type: General Level of consciousness: awake and alert Pain management: pain level controlled Vital Signs Assessment: post-procedure vital signs reviewed and stable Respiratory status: spontaneous breathing, nonlabored ventilation and respiratory function stable Cardiovascular status: blood pressure returned to baseline and stable Postop Assessment: no apparent nausea or vomiting Anesthetic complications: no    Last Vitals:  Vitals:   05/16/19 2128 05/16/19 2139  BP: (!) 160/94 (!) 161/90  Pulse: 79 79  Resp: 12 (!) 22  Temp:  36.8 C  SpO2: 94% 94%    Last Pain:  Vitals:   05/16/19 2139  TempSrc:   PainSc: 0-No pain                 Pedram Goodchild,W. EDMOND

## 2019-05-16 NOTE — Anesthesia Procedure Notes (Signed)
Procedure Name: Intubation Date/Time: 05/16/2019 7:38 PM Performed by: Shirlyn Goltz, CRNA Pre-anesthesia Checklist: Patient identified, Emergency Drugs available, Suction available and Patient being monitored Patient Re-evaluated:Patient Re-evaluated prior to induction Oxygen Delivery Method: Circle system utilized Preoxygenation: Pre-oxygenation with 100% oxygen Induction Type: IV induction Ventilation: Mask ventilation without difficulty Laryngoscope Size: Mac and 4 Grade View: Grade I Tube type: Oral Tube size: 7.5 mm Number of attempts: 1 Airway Equipment and Method: Stylet Placement Confirmation: ETT inserted through vocal cords under direct vision,  positive ETCO2 and breath sounds checked- equal and bilateral Secured at: 21 cm Tube secured with: Tape Dental Injury: Teeth and Oropharynx as per pre-operative assessment

## 2019-05-16 NOTE — H&P (Signed)
Jeffrey [name] is an 78 y.o. male.   Chief Complaint: table saw the was not available table saw injury to the left hand with near complete amputation to the ring finger HPI: Table saw injury left hand with pain and injury to the index middle and ring.  Patient has bone as well as significant soft tissue involvement.  He denies other injury.  He is alert and oriented.  The patient and I discussed all issues and we would recommend surgical irrigation debridement and repair is necessary.  He understands this and will get to the operative theater as soon as humanly possible.  He specifically denies neck back chest or abdominal pain  Past Medical History:  Diagnosis Date  . Adenomatous colon polyp    Dr. [name] (28 polyps on 1st endo, 3 on 2nd endo a year later.  8 on colonoscopy 02/26/14--recall 3 yrs.  Multiple adenomatous polyps on 09/20/17 TCS--recall 1 yr.. 11/2018 +aden pol->recall 3 yrs.  . Aortic insufficiency    echo 03/25/12-EF>55%, mild-mod Aortic regurg, sclerotic aortic valve,   . BPH (benign prostatic hyperplasia)    Dr. Jaived (pt is asymptomatic)  . Carotid artery occlusion    carotid dopplers 02/06/12-patent left carotid endarterectomy; right internal carotid 40-59%.  Repeat dopplers 03/2017, 40-59% stenosed bilat int carot--per vasc needs rpt 9 mo.  . Chronic renal insufficiency, stage II (mild)    Borderline stage II/III--CrCl @ 60.  No RAS on 2003 angiogram (done for + renal artery dopplers--deemed eventually to have been a false positive).  Dr. Berry.  . DDD (degenerative disc disease), lumbar 02/2013   with spondylosis and moderate spinal stenosis+ left L5 and S1 nerve root impingement  . Ectatic abdominal aorta (HCC) 08/08/2016   2.8 cm at widest point: repeat aortic u/s 08/2021.  . Essential tremor   . Fatty liver 2006   Noted on abd u/s and renal u/s 2006/2007  . History of hyperkalemia 02/2017   mild; recommended pt cut his ARB in half.  . History of MI (myocardial  infarction)    Large fixed inferior defect on myocardial perfusion imaging 03/2017.  . Hyperlipidemia    Dr. Hochrein started statin 03/2017.    . Hypertension   . PAD (peripheral artery disease) (HCC)    carotids: Dr. Hochrein started pt on statin for CV risk reduction 03/2017  . Plantar wart of left foot 12/21/2012  . Prediabetes 2019   A1c 5.9%  . SOB (shortness of breath)    MET test 03/25/12-mildly abnormal  . Solitary pulmonary nodule 03/14/2013   7mm RUL 03/08/13--stable on repeat 07/2013, 03/2015, and 01/2016.  No additional imaging is required.  . Tobacco dependence    ongoing as of 02/2016  . Vitamin B12 deficiency without anemia 2016   Intrinsic factor NEG: home vit B12 injections started 11/17/14    Past Surgical History:  Procedure Laterality Date  . APPENDECTOMY  remote  . Arm surgery  July 2010   Left arm cyst/Lipoma  . CARDIOVASCULAR STRESS TEST  03/2017   Low risk study (likely artifact seen, but no reversible ischemia, EF 59%, no wall motion abnormality.  . carotid dopplers  02/2015; 02/2016;12/2017   2017:  R ICA 1-39%, L ICA 40-59%: no change from 2016. 03/2017 rpt 40-59% bilat ICA stenosis--vasc recommended rpt 9 mo.  12/2017 1-39% bilat ICA stenosis.  Repeat 1 yr.  . CAROTID ENDARTERECTOMY  11/25/08   Left     ICA  . COLONOSCOPY N/A 02/26/2014   Recall 3   yrs: (+ polypectomy--tubular adenoma) Procedure: COLONOSCOPY;  Surgeon: [name] [name], MD;  [facility];  Service: Endoscopy;  Laterality: N/A;  830  . COLONOSCOPY N/A 09/20/2017   Multiple adenomatous polyps: recall 1 yr.  Procedure: COLONOSCOPY;  Surgeon: [name], [name], MD;  Location: [facility];  Service: Endoscopy;  Laterality: N/A;  730  . COLONOSCOPY N/A 11/21/2018   Procedure: COLONOSCOPY;  Surgeon: [name], [name], MD;  [facility]: [facility];  Service: Endoscopy;  Laterality: N/A;  930  . COLONOSCOPY W/ POLYPECTOMY  09/20/2017; 11/21/18   2019 Adenomatous polyp: recall 3-5 yrs (Dr. [name]).   11/2018-adenomatous polyp->recall 3 yrs  . POLYPECTOMY  09/20/2017   Procedure: POLYPECTOMY;  Surgeon: [name], [name], MD;  Location: [facility];  Service: Endoscopy;;  Ascending colon, Transverse colon, Hepatic flexure, Cecum  . POLYPECTOMY  11/21/2018   Procedure: POLYPECTOMY;  Surgeon: [name], [name], MD;  Location: [facility];  Service: Endoscopy;;  . TONSILLECTOMY  remote  . TONSILLECTOMY    . TRANSTHORACIC ECHOCARDIOGRAM  03/2012   2013 --EF 55%, normal LV syst fxn, impaired diast relaxation, mild/mod aortic regurg.  . UMBILICAL HERNIA REPAIR      Family History  Problem Relation Age of Onset  . Coronary artery disease Mother   . Stroke Mother   . Diabetes Mother   . Hypertension Mother   . Parkinson's disease Mother   . Stroke Sister   . Cancer Brother   . Heart disease Brother   . Colon cancer Neg Hx    Social History:  reports that he has been smoking cigarettes. He has a 60.00 pack-year smoking history. He has never used smokeless tobacco. He reports that he does not drink alcohol or use drugs.  Allergies:  Allergies  Allergen Reactions  . Metoprolol Other (See Comments)    Fatigue   . Clonidine Derivatives Rash    (Not in a hospital admission)   Results for orders placed or performed during the hospital encounter of 05/16/19 (from the past 48 hour(s))  CBC with Differential/Platelet     Status: None   Collection Time: 05/16/19  5:14 PM  Result Value Ref Range   WBC 8.5 4.0 - 10.5 K/uL   RBC 4.66 4.22 - 5.81 MIL/uL   Hemoglobin 13.7 13.0 - 17.0 g/dL   HCT 41.9 39.0 - 52.0 %   MCV 89.9 80.0 - 100.0 fL   MCH 29.4 26.0 - 34.0 pg   MCHC 32.7 30.0 - 36.0 g/dL   RDW 13.4 11.5 - 15.5 %   Platelets 211 150 - 400 K/uL   nRBC 0.0 0.0 - 0.2 %   Neutrophils Relative % 65 %   Neutro Abs 5.5 1.7 - 7.7 K/uL   Lymphocytes Relative 21 %   Lymphs Abs 1.8 0.7 - 4.0 K/uL   Monocytes Relative 9 %   Monocytes Absolute 0.8 0.1 - 1.0 K/uL   Eosinophils Relative 3 %    Eosinophils Absolute 0.2 0.0 - 0.5 K/uL   Basophils Relative 1 %   Basophils Absolute 0.1 0.0 - 0.1 K/uL   Immature Granulocytes 1 %   Abs Immature Granulocytes 0.07 0.00 - 0.07 K/uL    Comment: Performed at [facility], [address] [place], [place] [address]  Type and screen     Status: None (Preliminary result)   Collection Time: 05/16/19  5:17 PM  Result Value Ref Range   ABO/RH(D) PENDING    Antibody Screen PENDING      Sample Expiration      05/19/2019,2359 Performed at [facility], [place] [address], [place], [place] [address]    Dg Chest Port 1 View  Result Date: 05/16/2019 CLINICAL DATA:  Pre-op. Pt BIB [facility] for eval of L hand injury from table saw. Pt was cutting something in his shop and the saw "jumped up and cut him". Hand wrapped in gauze by EMS, last TDAP unknown. EXAM: PORTABLE CHEST 1 VIEW COMPARISON:  03/08/2013 and 01/13/2016 chest CT FINDINGS: Heart size is normal. There is atherosclerotic calcification of the thoracic aorta. Lungs are clear. No acute displaced fracture or pneumothorax. Degenerative changes are seen in thoracic spine. IMPRESSION: No evidence for acute abnormality. Electronically Signed   By: Nolon Nations M.D.   On: 05/16/2019 17:42   Dg Hand Complete Left  Result Date: 05/16/2019 CLINICAL DATA:  Pre-op. Pt BIB [facility] for eval of L hand injury from table saw. Pt was cutting something in his shop and the saw "jumped up and cut him". Hand wrapped in gauze by EMS, last TDAP unknown. EXAM: LEFT HAND - COMPLETE 3+ VIEW COMPARISON:  None. FINDINGS: Dressing material overlies the distal digits 2-5. Digit 2: Comminuted fracture of the distal phalanx associated soft tissue swelling and soft tissue irregularity. Middle phalanx is uninvolved. Digit 3: Comminuted fracture of the distal phalanx associated significant soft tissue swelling. There is degenerative change at the distal interphalangeal joint. Middle phalanx is uninvolved. Digit 4: Comminuted  fracture of the base of the distal phalanx and distal aspect of the middle phalanx and involvement of the interphalangeal joint. There is significant soft tissue swelling and soft tissue irregularity consistent laceration. Digits 1 and 5 are unremarkable. IMPRESSION: Fractures of the distal phalanges of digits 2 and 3, without associated involvement of the middle phalanges. Fracture of the distal aspect of the middle phalanx and proximal aspect of the distal phalanx of digits four, with involvement of the interphalangeal joint. Soft tissue lacerations. Electronically Signed   By: Nolon Nations M.D.   On: 05/16/2019 17:45    Review of Systems  Respiratory: Negative.   Cardiovascular: Negative.   Gastrointestinal: Negative.   Genitourinary: Negative.     Blood pressure (!) 232/104, pulse 73, temperature 98.4 F (36.9 C), temperature source Oral, resp. rate (!) 22, height 5' 9" (1.753 m), weight 68 kg, SpO2 100 %. Physical Exam  Significant injury to the hand with injury to the index middle ring finger with fractures about the middle phalanxes.  I reviewed these issues at length.  We will get a need to proceed with irrigation debridement repair is necessary and possible completion of ring finger amputation.  Patient I discussed all issues.  The options will be predicated upon his tissue and its abilities.  He denies neck back chest or abdominal pain he has some hypertension but does not have any shortness of breath chest pain or dizziness.  I reviewed these issues with him at length and the findings.  The patient is alert and oriented in no acute distress. The patient complains of pain in the affected upper extremity.  The patient is noted to have a normal HEENT exam. Lung fields show equal chest expansion and no shortness of breath. Abdomen exam is nontender without distention. Lower extremity examination does not show any fracture dislocation or blood clot symptoms. Pelvis is stable and  the neck and back are stable and nontender. Assessment/Plan We will plan for irrigation debridement skin subtenons tissue and repair reconstruction as necessary.  I discussed with him possible need for completion of amputation about the ring finger and other issues germane to his predicament.  We will do our best to salvage his finger based upon the intraoperative findings and tissue abilities.  We are planning surgery for your upper extremity. The risk and benefits of surgery to include risk of bleeding, infection, anesthesia,  damage to normal structures and failure of the surgery to accomplish its intended goals of relieving symptoms and restoring function have been discussed in detail. With this in mind we plan to proceed. I have specifically discussed with the patient the pre-and postoperative regime and the dos and don'ts and risk and benefits in great detail. Risk and benefits of surgery also include risk of dystrophy(CRPS), chronic nerve pain, failure of the healing process to go onto completion and other inherent risks of surgery The relavent the pathophysiology of the disease/injury process, as well as the alternatives for treatment and postoperative course of action has been discussed in great detail with the patient who desires to proceed.  We will do everything in our power to help you (the patient) restore function to the upper extremity. It is a pleasure to see this patient today.   William M Gramig III, MD 05/16/2019, 5:54 PM   

## 2019-05-16 NOTE — Anesthesia Preprocedure Evaluation (Addendum)
Anesthesia Evaluation  Patient identified by MRN, date of birth, ID band Patient awake    Reviewed: Allergy & Precautions, H&P , NPO status , Patient's Chart, lab work & pertinent test results  Airway Mallampati: II  TM Distance: >3 FB Neck ROM: Full    Dental no notable dental hx. (+) Upper Dentures, Lower Dentures, Dental Advisory Given   Pulmonary neg pulmonary ROS, Current SmokerPatient did not abstain from smoking.,    Pulmonary exam normal breath sounds clear to auscultation       Cardiovascular hypertension, Pt. on medications + Past MI and + Peripheral Vascular Disease  + Valvular Problems/Murmurs AI  Rhythm:Regular Rate:Normal     Neuro/Psych negative neurological ROS  negative psych ROS   GI/Hepatic negative GI ROS, Neg liver ROS,   Endo/Other  negative endocrine ROS  Renal/GU Renal InsufficiencyRenal disease  negative genitourinary   Musculoskeletal  (+) Arthritis , Osteoarthritis,    Abdominal   Peds  Hematology negative hematology ROS (+)   Anesthesia Other Findings   Reproductive/Obstetrics negative OB ROS                            Anesthesia Physical Anesthesia Plan  ASA: III and emergent  Anesthesia Plan: General   Post-op Pain Management:    Induction: Intravenous, Rapid sequence and Cricoid pressure planned  PONV Risk Score and Plan: 2 and Ondansetron, Treatment may vary due to age or medical condition and Dexamethasone  Airway Management Planned: Oral ETT  Additional Equipment:   Intra-op Plan:   Post-operative Plan: Extubation in OR  Informed Consent: I have reviewed the patients History and Physical, chart, labs and discussed the procedure including the risks, benefits and alternatives for the proposed anesthesia with the patient or authorized representative who has indicated his/her understanding and acceptance.     Dental advisory given  Plan  Discussed with: CRNA  Anesthesia Plan Comments:         Anesthesia Quick Evaluation

## 2019-05-16 NOTE — ED Triage Notes (Signed)
Pt BIB [facility] for eval of L hand injury from table saw. Pt was cutting something in his shop and the saw "jumped up and cut him". Hand wrapped in gauze by EMS, last TDAP unknown. EMS reports partial amps to L pointer middle and ring finger.

## 2019-05-16 NOTE — Op Note (Signed)
Operative note 05/16/2019  Roseanne Kaufman MD  Preoperative diagnosis: #1 tablesaw injury to the left hand with involvement of the index middle and ring finger.  Involvement includes nailbed nail plate distal phalanx and tendon about all 3 fingers.  The ring finger is nearly amputated and dysvascular.  Postop diagnosis the same.  Procedure #1 irrigation debridement skin subtenons tissue bone tendon and associated soft tissue index finger left hand, middle finger left hand, ring finger left hand.  This was an excisional debridement utilizing curette knife and scissor #2 nail plate removal left index finger #3 open reduction internal fixation left index finger distal phalanx #4 complex nailbed repair left index finger #5 open reduction internal fixation distal phalanx fracture with Kirschner wire left middle finger #6 complex nailbed repair left middle finger #7 complex rotation flap coverage left middle finger secondary to exposed tendon architecture dorsally #8 revision amputation left ring finger at the DIP joint level utilizing rotation flap and bilateral neurectomies #9 radiographic examination of the left hand 5 view  Surgeon Roseanne Kaufman  Anesthesia General  Estimated blood loss minimal  Complications none  Indications for procedure: This patient is a pleasant male presents above-mentioned diagnosis have counseled him in regards to risk and benefits of surgery and he desires to proceed with the above-mentioned surgical avenues of care.  Operative procedure: Patient was seen by myself and anesthesia extensively counseled.  Taken to the operative theater and underwent a smooth induction of general anesthetic.  This was a LMA general anesthetic.  Preoperative Ancef was given.  Following this the patient then underwent Hibiclens pre-scrub x2 followed by 10-minute surgical Betadine scrub which I performed myself.  Following this timeout was observed and we then performed a final checklist.  At  this time we performed irrigation and debridement of skin subtenons tissue nailbed nail plate and open fracture as well as tendon tissue about the index finger middle finger and ring finger.  The ring finger was hanging on by thin dysvascular portion of skin otherwise had no meaningful vascularity distal to the DIP region.  6 L of saline were placed through and through the areas.  Curette knife and scissor were used to debride these areas very aggressively.  Following this tabs were changed and the patient then had attention turned towards reconstruction.  The index finger was addressed with nail plate removal.  Following nail plate removal we performed open reduction internal fixation of the distal phalanx with setting technique.  Following this I performed a complex nailbed repair including the germinal matrix and sterile matrix.  I then repaired the volar flap.  All looked well and I was pleased with the tension.  Following this attention was turned towards the ring finger.  The ring finger underwent completion of the amputation as it was dysvascular.  I then sculpted rotation flaps and made incisions with knife blade for such.  The DIP level amputation was treated with bilateral neurectomies.  The nerves were crushed cauterized and allowed to retract.  The arterial entities underwent cauterization.  The flexor digitorum profundus was pulled distally and severed.  The FDS was not intact.  Following this up from complex rotation flap coverage.  This time we deflated the tourniquet all looked quite well.  The tourniquet was up for approximately 30 to 45 minutes total.  Following this attention was turned towards middle finger.  Open reduction internal fixation was accomplished.  Kirschner wire was used to place distal to proximal a wire that transfixed the DIP  joint and allow the bone to be set.  Following this I performed a complex nailbed repair without difficulty.  Following this a very  carefully sculpted rotation flap was accomplished.  The patient had a rotation flap accomplished with swinging the tissue into the 1 cm defect.  This allowed for coverage over the bone and extensor tendon.  Patient tolerated this well.  The pin was buried and will be removed at 4 to 6 weeks depending on how the bone looks as there is tremendous crush injury.  I would give the nailbeds a variable prognosis as the germinal matrix was encroached upon about the middle and index finger.  Patient had good refill no complicating features.  15 cc of Sensorcaine were placed in the wound for postop analgesia.  He will be discharged home on Keflex oxycodone and return to see me in the office in 14 days.  All questions have been encouraged and answered.  At the time of the operation conclusion he was quite stable.  I discussed all issues with his family.  Pleasure to see him today anticipate his care  Roseanne Kaufman MD

## 2019-05-16 NOTE — Discharge Instructions (Signed)

## 2019-05-16 NOTE — ED Provider Notes (Addendum)
[name] Provider Note   CSN: [id number] Arrival date & time: 05/16/19  1657     History   Chief Complaint Chief Complaint  Patient presents with  . Hand Injury    HPI [name] is a 78 y.o. male.     78 year old male presents with lacerations to his left hand caused by a table saw just prior to arrival.  Complains of injury to the left second, third, fourth, fifth digits.  Patient is unable to flex his left fourth digit at the distal tip.  Has decreased sensation to that digit but none to the other 3 digits.  He had noted moderate bleeding which was controlled with direct pressure.  Presents at this time     Past Medical History:  Diagnosis Date  . Adenomatous colon polyp    Dr. Laural Golden (28 polyps on 1st endo, 3 on 2nd endo a year later.  8 on colonoscopy 02/26/14--recall 3 yrs.  Multiple adenomatous polyps on 09/20/17 TCS--recall 1 yr.. 11/2018 +aden pol->recall 3 yrs.  . Aortic insufficiency    echo 03/25/12-EF>55%, mild-mod Aortic regurg, sclerotic aortic valve,   . BPH (benign prostatic hyperplasia)    Dr. Lum Babe (pt is asymptomatic)  . Carotid artery occlusion    carotid dopplers 02/06/12-patent left carotid endarterectomy; right internal carotid 40-59%.  Repeat dopplers 03/2017, 40-59% stenosed bilat int carot--per vasc needs rpt 9 mo.  . Chronic renal insufficiency, stage II (mild)    Borderline stage II/III--CrCl @ 60.  No RAS on 2003 angiogram (done for + renal artery dopplers--deemed eventually to have been a false positive).  Dr. Gwenlyn Found.  . DDD (degenerative disc disease), lumbar 02/2013   with spondylosis and moderate spinal stenosis+ left L5 and S1 nerve root impingement  . Ectatic abdominal aorta ([name]) 08/08/2016   2.8 cm at widest point: repeat aortic u/s 08/2021.  Marland Kitchen Essential tremor   . Fatty liver 2006   Noted on abd u/s and renal u/s 2006/2007  . History of hyperkalemia 02/2017   mild; recommended pt cut his ARB in  half.  . History of MI (myocardial infarction)    Large fixed inferior defect on myocardial perfusion imaging 03/2017.  Marland Kitchen Hyperlipidemia    Dr. Percival Spanish started statin 03/2017.    Marland Kitchen Hypertension   . PAD (peripheral artery disease) ([place])    carotids: Dr. Percival Spanish started pt on statin for CV risk reduction 03/2017  . Plantar wart of left foot 12/21/2012  . Prediabetes 2019   A1c 5.9%  . SOB (shortness of breath)    MET test 03/25/12-mildly abnormal  . Solitary pulmonary nodule 03/14/2013   49m RUL 03/08/13--stable on repeat 07/2013, 03/2015, and 01/2016.  No additional imaging is required.  . Tobacco dependence    ongoing as of 02/2016  . Vitamin B12 deficiency without anemia 2016   Intrinsic factor NEG: home vit B12 injections started 11/17/14    Patient Active Problem List   Diagnosis Date Noted  . Hx of colonic polyps 10/23/2018  . Chronic renal insufficiency, stage II (mild)   . History of colonic polyps 03/12/2017  . Essential tremor   . Vitamin B12 deficiency 12/10/2014  . Peripheral neuropathy 11/22/2014  . Occlusion and stenosis of carotid artery without mention of cerebral infarction-Left 01/07/2014  . Hyperlipidemia 11/24/2013  . Cerumen impaction 11/24/2013  . Adenomatous colon polyp   . Dyspnea 04/01/2013  . Labile hypertension 03/14/2013  . Solitary pulmonary nodule 03/14/2013  . Tobacco dependence 03/14/2013  .  Left leg pain 02/10/2013  . Essential hypertension, benign 02/10/2013  . Health maintenance examination 12/23/2012    Past Surgical History:  Procedure Laterality Date  . APPENDECTOMY  remote  . Arm surgery  July 2010   Left arm cyst/Lipoma  . CARDIOVASCULAR STRESS TEST  03/2017   Low risk study (likely artifact seen, but no reversible ischemia, EF 59%, no wall motion abnormality.  . carotid dopplers  02/2015; 02/2016;12/2017   2017:  R ICA 1-39%, L ICA 40-59%: no change from 2016. 03/2017 rpt 40-59% bilat ICA stenosis--vasc recommended rpt 9 mo.  12/2017 1-39%  bilat ICA stenosis.  Repeat 1 yr.  Marland Kitchen CAROTID ENDARTERECTOMY  11/25/08   Left     ICA  . COLONOSCOPY N/A 02/26/2014   Recall 3 yrs: (+ polypectomy--tubular adenoma) Procedure: COLONOSCOPY;  Surgeon: Rogene Houston, MD;  Location: [facility];  Service: Endoscopy;  Laterality: N/A;  830  . COLONOSCOPY N/A 09/20/2017   Multiple adenomatous polyps: recall 1 yr.  Procedure: COLONOSCOPY;  Surgeon: Rogene Houston, MD;  Location: [facility];  Service: Endoscopy;  Laterality: N/A;  730  . COLONOSCOPY N/A 11/21/2018   Procedure: COLONOSCOPY;  Surgeon: Rogene Houston, MD;  Location: [facility];  Service: Endoscopy;  Laterality: N/A;  930  . COLONOSCOPY W/ POLYPECTOMY  09/20/2017; 11/21/18   2019 Adenomatous polyp: recall 3-5 yrs (Dr. Laural Golden).  11/2018-adenomatous polyp->recall 3 yrs  . POLYPECTOMY  09/20/2017   Procedure: POLYPECTOMY;  Surgeon: Rogene Houston, MD;  Location: [facility];  Service: Endoscopy;;  Ascending colon, Transverse colon, Hepatic flexure, Cecum  . POLYPECTOMY  11/21/2018   Procedure: POLYPECTOMY;  Surgeon: Rogene Houston, MD;  Location: [facility];  Service: Endoscopy;;  . TONSILLECTOMY  remote  . TONSILLECTOMY    . TRANSTHORACIC ECHOCARDIOGRAM  03/2012   2013 --EF 55%, normal LV syst fxn, impaired diast relaxation, mild/mod aortic regurg.  Marland Kitchen UMBILICAL HERNIA REPAIR          Home Medications    Prior to Admission medications   Medication Sig Start Date End Date Taking? Authorizing Provider  aspirin EC 81 MG tablet Take 1 tablet (81 mg total) by mouth at bedtime. 11/28/18   [name], Mechele Dawley, MD  atorvastatin (LIPITOR) 40 MG tablet Take 1 tablet (40 mg total) by mouth daily. 03/27/19   McGowen, Adrian Blackwater, MD  cyanocobalamin (,VITAMIN B-12,) 1000 MCG/ML injection Inject 1,000 mcg into the muscle every 30 (thirty) days.    [provider]  hydrochlorothiazide (HYDRODIURIL) 25 MG tablet Take 1 tablet (25 mg total) by mouth daily. 03/27/19   McGowen, Adrian Blackwater,  MD  Multiple Vitamins-Minerals (MULTIVITAMIN ADULT) TABS Take by mouth daily.    [provider]    Family History Family History  Problem Relation Age of Onset  . Coronary artery disease Mother   . Stroke Mother   . Diabetes Mother   . Hypertension Mother   . Parkinson's disease Mother   . Stroke Sister   . Cancer Brother   . Heart disease Brother   . Colon cancer Neg Hx     Social History Social History   Tobacco Use  . Smoking status: Current Every Day Smoker    Packs/day: 1.00    Years: 60.00    Pack years: 60.00    Types: Cigarettes  . Smokeless tobacco: Never Used  Substance Use Topics  . Alcohol use: No    Alcohol/week: 0.0 standard drinks  . Drug use:  No     Allergies   Metoprolol and Clonidine derivatives   Review of Systems Review of Systems  All other systems reviewed and are negative.    Physical Exam Updated Vital Signs BP (!) 194/98 (BP Location: Right Arm)   Pulse 83   Temp 98.4 F (36.9 C) (Oral)   Resp 18   Ht 1.753 m (5' 9" )   Wt 68 kg   SpO2 98%   BMI 22.15 kg/m   Physical Exam Vitals signs and nursing note reviewed.  Constitutional:      General: He is not in acute distress.    Appearance: Normal appearance. He is well-developed. He is not toxic-appearing.  HENT:     Head: Normocephalic and atraumatic.  Eyes:     General: Lids are normal.     Conjunctiva/sclera: Conjunctivae normal.     Pupils: Pupils are equal, round, and reactive to light.  Neck:     Musculoskeletal: Normal range of motion and neck supple.     Thyroid: No thyroid mass.     Trachea: No tracheal deviation.  Cardiovascular:     Rate and Rhythm: Normal rate and regular rhythm.     Heart sounds: Normal heart sounds. No murmur. No gallop.   Pulmonary:     Effort: Pulmonary effort is normal. No respiratory distress.     Breath sounds: Normal breath sounds. No stridor. No decreased breath sounds, wheezing, rhonchi or rales.  Abdominal:     General:  Bowel sounds are normal. There is no distension.     Palpations: Abdomen is soft.     Tenderness: There is no abdominal tenderness. There is no rebound.  Musculoskeletal: Normal range of motion.        General: No tenderness.     Comments: Near complete amputation at the DIP of the left fourth digit.  Lacerations noted across the dorsal surface of the third, second digits.  Patient has no sensation at the tip of the fourth digit.  Patient has normal flexion and extension at the left second third and fifth digit.  But has good sensation at the others.  The thumb is been spared.  Skin:    General: Skin is warm and dry.     Findings: No abrasion or rash.  Neurological:     Mental Status: He is alert and oriented to person, place, and time.     GCS: GCS eye subscore is 4. GCS verbal subscore is 5. GCS motor subscore is 6.     Cranial Nerves: No cranial nerve deficit.     Sensory: No sensory deficit.  Psychiatric:        Speech: Speech normal.        Behavior: Behavior normal.      ED Treatments / Results  Labs (all labs ordered are listed, but only abnormal results are displayed) Labs Reviewed  SARS CORONAVIRUS 2 ([facility])  CBC WITH DIFFERENTIAL/PLATELET  BASIC METABOLIC PANEL  SAMPLE TO BLOOD BANK  TYPE AND SCREEN    EKG None  Radiology No results found.  Procedures Procedures (including critical care time)  Medications Ordered in ED Medications  ceFAZolin (ANCEF) IVPB 1 g/50 mL premix (1 g Intravenous New Bag/Given 05/16/19 1713)  Tdap (BOOSTRIX) injection 0.5 mL (0.5 mLs Intramuscular Given 05/16/19 1713)     Initial Impression / Assessment and Plan / ED Course  I have reviewed the triage vital signs and the nursing notes.  Pertinent labs &  imaging results that were available during my care of the patient were reviewed by me and considered in my medical decision making (see chart for details).        Check x-ray of  hand.  Patient's tendon status updated.  Given Ancef 1 g.  Will consult hand surgery  Final Clinical Impressions(s) / ED Diagnoses   Final diagnoses:  Pre-op evaluation    ED Discharge Orders    None       Lacretia Leigh, MD 05/16/19 Jamal Maes    Lacretia Leigh, MD 05/16/19 1756

## 2019-05-20 ENCOUNTER — Encounter (HOSPITAL_COMMUNITY): Payer: Self-pay | Admitting: Orthopedic Surgery

## 2019-05-29 DIAGNOSIS — M79642 Pain in left hand: Secondary | ICD-10-CM | POA: Insufficient documentation

## 2019-05-29 DIAGNOSIS — S62639B Displaced fracture of distal phalanx of unspecified finger, initial encounter for open fracture: Secondary | ICD-10-CM | POA: Insufficient documentation

## 2019-05-29 DIAGNOSIS — S61219A Laceration without foreign body of unspecified finger without damage to nail, initial encounter: Secondary | ICD-10-CM | POA: Insufficient documentation

## 2019-06-19 ENCOUNTER — Other Ambulatory Visit: Payer: Self-pay

## 2019-06-19 DIAGNOSIS — I6523 Occlusion and stenosis of bilateral carotid arteries: Secondary | ICD-10-CM

## 2019-06-20 ENCOUNTER — Other Ambulatory Visit: Payer: Self-pay

## 2019-06-20 ENCOUNTER — Ambulatory Visit: Payer: Medicare Other | Admitting: Family

## 2019-06-20 ENCOUNTER — Encounter: Payer: Self-pay | Admitting: Family

## 2019-06-20 ENCOUNTER — Ambulatory Visit (HOSPITAL_COMMUNITY)
Admission: RE | Admit: 2019-06-20 | Discharge: 2019-06-20 | Disposition: A | Discharge status: Home / Self Care | Payer: Medicare Other | Source: Ambulatory Visit | Attending: Family | Admitting: Family

## 2019-06-20 VITALS — BP 139/73 | HR 65 | Temp 98.4°F | Resp 18 | Ht 68.0 in | Wt 138.0 lb

## 2019-06-20 DIAGNOSIS — I6523 Occlusion and stenosis of bilateral carotid arteries: Secondary | ICD-10-CM | POA: Insufficient documentation

## 2019-06-20 DIAGNOSIS — F17200 Nicotine dependence, unspecified, uncomplicated: Secondary | ICD-10-CM | POA: Diagnosis not present

## 2019-06-20 DIAGNOSIS — Z9889 Other specified postprocedural states: Secondary | ICD-10-CM

## 2019-06-20 NOTE — Patient Instructions (Signed)
Steps to Quit Smoking Smoking tobacco is the leading cause of preventable death. It can affect almost every organ in the body. Smoking puts you and people around you at risk for many serious, long-lasting (chronic) diseases. Quitting smoking can be hard, but it is one of the best things that you can do for your health. It is never too late to quit. How do I get ready to quit? When you decide to quit smoking, make a plan to help you succeed. Before you quit:  Pick a date to quit. Set a date within the next 2 weeks to give you time to prepare.  Write down the reasons why you are quitting. Keep this list in places where you will see it often.  Tell your family, friends, and co-workers that you are quitting. Their support is important.  Talk with your doctor about the choices that may help you quit.  Find out if your health insurance will pay for these treatments.  Know the people, places, things, and activities that make you want to smoke (triggers). Avoid them. What first steps can I take to quit smoking?  Throw away all cigarettes at home, at work, and in your car.  Throw away the things that you use when you smoke, such as ashtrays and lighters.  Clean your car. Make sure to empty the ashtray.  Clean your home, including curtains and carpets. What can I do to help me quit smoking? Talk with your doctor about taking medicines and seeing a counselor at the same time. You are more likely to succeed when you do both.  If you are pregnant or breastfeeding, talk with your doctor about counseling or other ways to quit smoking. Do not take medicine to help you quit smoking unless your doctor tells you to do so. To quit smoking: Quit right away  Quit smoking totally, instead of slowly cutting back on how much you smoke over a period of time.  Go to counseling. You are more likely to quit if you go to counseling sessions regularly. Take medicine You may take medicines to help you quit. Some  medicines need a prescription, and some you can buy over-the-counter. Some medicines may contain a drug called nicotine to replace the nicotine in cigarettes. Medicines may:  Help you to stop having the desire to smoke (cravings).  Help to stop the problems that come when you stop smoking (withdrawal symptoms). Your doctor may ask you to use:  Nicotine patches, gum, or lozenges.  Nicotine inhalers or sprays.  Non-nicotine medicine that is taken by mouth. Find resources Find resources and other ways to help you quit smoking and remain smoke-free after you quit. These resources are most helpful when you use them often. They include:  Online chats with a counselor.  Phone quitlines.  Printed self-help materials.  Support groups or group counseling.  Text messaging programs.  Mobile phone apps. Use apps on your mobile phone or tablet that can help you stick to your quit plan. There are many free apps for mobile phones and tablets as well as websites. Examples include Quit Guide from the CDC and smokefree.gov  What things can I do to make it easier to quit?   Talk to your family and friends. Ask them to support and encourage you.  Call a phone quitline (1-800-QUIT-NOW), reach out to support groups, or work with a counselor.  Ask people who smoke to not smoke around you.  Avoid places that make you want to smoke,   such as: ? Bars. ? Parties. ? Smoke-break areas at work.  Spend time with people who do not smoke.  Lower the stress in your life. Stress can make you want to smoke. Try these things to help your stress: ? Getting regular exercise. ? Doing deep-breathing exercises. ? Doing yoga. ? Meditating. ? Doing a body scan. To do this, close your eyes, focus on one area of your body at a time from head to toe. Notice which parts of your body are tense. Try to relax the muscles in those areas. How will I feel when I quit smoking? Day 1 to 3 weeks Within the first 24 hours,  you may start to have some problems that come from quitting tobacco. These problems are very bad 2-3 days after you quit, but they do not often last for more than 2-3 weeks. You may get these symptoms:  Mood swings.  Feeling restless, nervous, angry, or annoyed.  Trouble concentrating.  Dizziness.  Strong desire for high-sugar foods and nicotine.  Weight gain.  Trouble pooping (constipation).  Feeling like you may vomit (nausea).  Coughing or a sore throat.  Changes in how the medicines that you take for other issues work in your body.  Depression.  Trouble sleeping (insomnia). Week 3 and afterward After the first 2-3 weeks of quitting, you may start to notice more positive results, such as:  Better sense of smell and taste.  Less coughing and sore throat.  Slower heart rate.  Lower blood pressure.  Clearer skin.  Better breathing.  Fewer sick days. Quitting smoking can be hard. Do not give up if you fail the first time. Some people need to try a few times before they succeed. Do your best to stick to your quit plan, and talk with your doctor if you have any questions or concerns. Summary  Smoking tobacco is the leading cause of preventable death. Quitting smoking can be hard, but it is one of the best things that you can do for your health.  When you decide to quit smoking, make a plan to help you succeed.  Quit smoking right away, not slowly over a period of time.  When you start quitting, seek help from your doctor, family, or friends. This information is not intended to replace advice given to you by your health care provider. Make sure you discuss any questions you have with your health care provider. Document Released: 06/24/2009 Document Revised: 11/15/2018 Document Reviewed: 11/16/2018 Elsevier Patient Education  2020 [employer]     Stroke Prevention Some medical conditions and lifestyle choices can lead to a higher risk for a stroke. You can  help to prevent a stroke by making nutrition, lifestyle, and other changes. What nutrition changes can be made?   Eat healthy foods. ? Choose foods that are high in fiber. These include:  Fresh fruits.  Fresh vegetables.  Whole grains. ? Eat at least 5 or more servings of fruits and vegetables each day. Try to fill half of your plate at each meal with fruits and vegetables. ? Choose lean protein foods. These include:  Lowfat (lean) cuts of meat.  Chicken without skin.  Fish.  Tofu.  Beans.  Nuts. ? Eat low-fat dairy products. ? Avoid foods that:  Are high in salt (sodium).  Have saturated fat.  Have trans fat.  Have cholesterol.  Are processed.  Are premade.  Follow eating guidelines as told by your doctor. These may include: ? Reducing how many calories you   eat and drink each day. ? Limiting how much salt you eat or drink each day to 1,500 milligrams (mg). ? Using only healthy fats for cooking. These include:  Olive oil.  Canola oil.  Sunflower oil. ? Counting how many carbohydrates you eat and drink each day. What lifestyle changes can be made?  Try to stay at a healthy weight. Talk to your doctor about what a good weight is for you.  Get at least 30 minutes of moderate physical activity at least 5 days a week. This can include: ? Fast walking. ? Biking. ? Swimming.  Do not use any products that have nicotine or tobacco. This includes cigarettes and e-cigarettes. If you need help quitting, ask your doctor. Avoid being around tobacco smoke in general.  Limit how much alcohol you drink to no more than 1 drink a day for nonpregnant women and 2 drinks a day for men. One drink equals 12 oz of beer, 5 oz of wine, or 1 oz of hard liquor.  Do not use drugs.  Avoid taking birth control pills. Talk to your doctor about the risks of taking birth control pills if: ? You are over 35 years old. ? You smoke. ? You get migraines. ? You have had a blood clot.  What other changes can be made?  Manage your cholesterol. ? It is important to eat a healthy diet. ? If your cholesterol cannot be managed through your diet, you may also need to take medicines. Take medicines as told by your doctor.  Manage your diabetes. ? It is important to eat a healthy diet and to exercise regularly. ? If your blood sugar cannot be managed through diet and exercise, you may need to take medicines. Take medicines as told by your doctor.  Control your high blood pressure (hypertension). ? Try to keep your blood pressure below 130/80. This can help lower your risk of stroke. ? It is important to eat a healthy diet and to exercise regularly. ? If your blood pressure cannot be managed through diet and exercise, you may need to take medicines. Take medicines as told by your doctor. ? Ask your doctor if you should check your blood pressure at home. ? Have your blood pressure checked every year. Do this even if your blood pressure is normal.  Talk to your doctor about getting checked for a sleep disorder. Signs of this can include: ? Snoring a lot. ? Feeling very tired.  Take over-the-counter and prescription medicines only as told by your doctor. These may include aspirin or blood thinners (antiplatelets or anticoagulants).  Make sure that any other medical conditions you have are managed. Where to find more information  American Stroke Association: www.strokeassociation.org  National Stroke Association: www.stroke.org Get help right away if:  You have any symptoms of stroke. "BE FAST" is an easy way to remember the main warning signs: ? B - Balance. Signs are dizziness, sudden trouble walking, or loss of balance. ? E - Eyes. Signs are trouble seeing or a sudden change in how you see. ? F - Face. Signs are sudden weakness or loss of feeling of the face, or the face or eyelid drooping on one side. ? A - Arms. Signs are weakness or loss of feeling in an arm. This  happens suddenly and usually on one side of the body. ? S - Speech. Signs are sudden trouble speaking, slurred speech, or trouble understanding what people say. ? T - Time. Time to call emergency   services. Write down what time symptoms started.  You have other signs of stroke, such as: ? A sudden, very bad headache with no known cause. ? Feeling sick to your stomach (nausea). ? Throwing up (vomiting). ? Jerky movements you cannot control (seizure). These symptoms may represent a serious problem that is an emergency. Do not wait to see if the symptoms will go away. Get medical help right away. Call your local emergency services (911 in the [place] Do not drive yourself to the hospital. Summary  You can prevent a stroke by eating healthy, exercising, not smoking, drinking less alcohol, and treating other health problems, such as diabetes, high blood pressure, or high cholesterol.  Do not use any products that contain nicotine or tobacco, such as cigarettes and e-cigarettes.  Get help right away if you have any signs or symptoms of a stroke. This information is not intended to replace advice given to you by your health care provider. Make sure you discuss any questions you have with your health care provider. Document Released: 02/27/2012 Document Revised: 10/24/2018 Document Reviewed: 11/29/2016 Elsevier Patient Education  2020 Elsevier Inc.  

## 2019-06-20 NOTE — Progress Notes (Signed)
Chief Complaint: Follow up Extracranial Carotid Artery Stenosis   History of Present Illness  Jeffrey [name] is a 78 y.o. male who is s/p left CEA in April 2010 by Dr. Kellie Simmering.  He denies anyhistory of TIA or stroke symptom.Specifically hedenies a history ofamaurosis fugax or monocular blindness, unilateralfacial drooping, hemiparesis/hemiplegia, orreceptive or expressive aphasia.   He reports that he has had his kidney arteries checked due to uncontrolled hypertension, states this was not the problem; his antihypertensive medications are chronically adjusted.   He denies claudication symptoms in legs with walking, denies non healing wounds. 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  He accidentally injured left fingers with a table saw on 05-16-19, hand surgeon salvaged most of his finger tissue and function.   Diabetic: No Tobacco use: smoker (2/3 ppd, started smoking at about age 73 yrs)  Pt meds include: Statin : Yes ASA:81 mg ASA Other anticoagulants/antiplatelets: no   Past Medical History:  Diagnosis Date  . Adenomatous colon polyp    Dr. Laural Golden (28 polyps on 1st endo, 3 on 2nd endo a year later.  8 on colonoscopy 02/26/14--recall 3 yrs.  Multiple adenomatous polyps on 09/20/17 TCS--recall 1 yr.. 11/2018 +aden pol->recall 3 yrs.  . Aortic insufficiency    echo 03/25/12-EF>55%, mild-mod Aortic regurg, sclerotic aortic valve,   . BPH (benign prostatic hyperplasia)    Dr. Lum Babe (pt is asymptomatic)  . Carotid artery occlusion    carotid dopplers 02/06/12-patent left carotid endarterectomy; right internal carotid 40-59%.  Repeat dopplers 03/2017, 40-59% stenosed bilat int carot--per vasc needs rpt 9 mo.  . Chronic renal insufficiency, stage II (mild)    Borderline stage II/III--CrCl @ 62.  No RAS on 2003 angiogram (done for + renal artery dopplers--deemed eventually to have been a false positive).   Dr. Gwenlyn Found.  . DDD (degenerative disc disease), lumbar 02/2013   with spondylosis and moderate spinal stenosis+ left L5 and S1 nerve root impingement  . Ectatic abdominal aorta ([place]) 08/08/2016   2.8 cm at widest point: repeat aortic u/s 08/2021.  Marland Kitchen Essential tremor   . Fatty liver 2006   Noted on abd u/s and renal u/s 2006/2007  . History of hyperkalemia 02/2017   mild; recommended pt cut his ARB in half.  . History of MI (myocardial infarction)    Large fixed inferior defect on myocardial perfusion imaging 03/2017.  Marland Kitchen Hyperlipidemia    Dr. Percival Spanish started statin 03/2017.    Marland Kitchen Hypertension   . PAD (peripheral artery disease) ([place])    carotids: Dr. Percival Spanish started pt on statin for CV risk reduction 03/2017  . Plantar wart of left foot 12/21/2012  . Prediabetes 2019   A1c 5.9%  . SOB (shortness of breath)    MET test 03/25/12-mildly abnormal  . Solitary pulmonary nodule 03/14/2013   34m RUL 03/08/13--stable on repeat 07/2013, 03/2015, and 01/2016.  No additional imaging is required.  . Tobacco dependence    ongoing as of 02/2016  . Vitamin B12 deficiency without anemia 2016   Intrinsic factor NEG: home vit B12 injections started 11/17/14    Social History Social History   Tobacco Use  . Smoking status: Current Every Day Smoker    Packs/day: 1.00    Years: 60.00    Pack years: 60.00    Types: Cigarettes  . Smokeless tobacco: Never Used  Substance Use Topics  . Alcohol use: No    Alcohol/week:  0.0 standard drinks  . Drug use: No    Family History Family History  Problem Relation Age of Onset  . Coronary artery disease Mother   . Stroke Mother   . Diabetes Mother   . Hypertension Mother   . Parkinson's disease Mother   . Stroke Sister   . Cancer Brother   . Heart disease Brother   . Colon cancer Neg Hx     Surgical History Past Surgical History:  Procedure Laterality Date  . AMPUTATION FINGER Left 05/16/2019   Procedure: Revision Amutation of Left  fourth finger;   Surgeon: Roseanne Kaufman, MD;  Location: [facility];  Service: Orthopedics;  Laterality: Left;  . APPENDECTOMY  remote  . Arm surgery  July 2010   Left arm cyst/Lipoma  . CARDIOVASCULAR STRESS TEST  03/2017   Low risk study (likely artifact seen, but no reversible ischemia, EF 59%, no wall motion abnormality.  . carotid dopplers  02/2015; 02/2016;12/2017   2017:  R ICA 1-39%, L ICA 40-59%: no change from 2016. 03/2017 rpt 40-59% bilat ICA stenosis--vasc recommended rpt 9 mo.  12/2017 1-39% bilat ICA stenosis.  Repeat 1 yr.  Marland Kitchen CAROTID ENDARTERECTOMY  11/25/08   Left     ICA  . COLONOSCOPY N/A 02/26/2014   Recall 3 yrs: (+ polypectomy--tubular adenoma) Procedure: COLONOSCOPY;  Surgeon: Rogene Houston, MD;  [facility];  Service: Endoscopy;  Laterality: N/A;  830  . COLONOSCOPY N/A 09/20/2017   Multiple adenomatous polyps: recall 1 yr.  Procedure: COLONOSCOPY;  Surgeon: Rogene Houston, MD;  [facility];  Service: Endoscopy;  Laterality: N/A;  730  . COLONOSCOPY N/A 11/21/2018   Procedure: COLONOSCOPY;  Surgeon: Rogene Houston, MD;  [facility];  Service: Endoscopy;  Laterality: N/A;  930  . COLONOSCOPY W/ POLYPECTOMY  09/20/2017; 11/21/18   2019 Adenomatous polyp: recall 3-5 yrs (Dr. Laural Golden).  11/2018-adenomatous polyp->recall 3 yrs  . I&D EXTREMITY Left 05/16/2019   Procedure: IRRIGATION AND DEBRIDEMENT OF LEFT INDEX, MIDDLE,FOURTH FINGERS;  Surgeon: Roseanne Kaufman, MD;  Location: [facility];  Service: Orthopedics;  Laterality: Left;  . NAILBED REPAIR Left 05/16/2019   Procedure: Reconstruction of bone and nail bed Left Index finger;  Surgeon: Roseanne Kaufman, MD;  Location: [facility];  Service: Orthopedics;  Laterality: Left;  . OPEN REDUCTION INTERNAL FIXATION (ORIF) HAND Left 05/16/2019   Procedure: Reconstruction and Open Reduction and Internal fixation and Rotation flap Left Middle finger;  Surgeon: Roseanne Kaufman, MD;  Location: [facility];  Service: Orthopedics;  Laterality: Left;   . POLYPECTOMY  09/20/2017   Procedure: POLYPECTOMY;  Surgeon: Rogene Houston, MD;  [facility];  Service: Endoscopy;;  Ascending colon, Transverse colon, Hepatic flexure, Cecum  . POLYPECTOMY  11/21/2018   Procedure: POLYPECTOMY;  Surgeon: Rogene Houston, MD;  [facility];  Service: Endoscopy;;  . TONSILLECTOMY  remote  . TONSILLECTOMY    . TRANSTHORACIC ECHOCARDIOGRAM  03/2012   2013 --EF 55%, normal LV syst fxn, impaired diast relaxation, mild/mod aortic regurg.  Marland Kitchen UMBILICAL HERNIA REPAIR      Allergies  Allergen Reactions  . Metoprolol Other (See Comments)    Fatigue   . Other   . Clonidine Derivatives Rash    Current Outpatient Medications  Medication Sig Dispense Refill  . acetaminophen (TYLENOL) 500 MG tablet Take 1,000 mg by mouth every 6 (six) hours as needed for moderate pain.    Marland Kitchen aspirin EC 81 MG tablet  Take 1 tablet (81 mg total) by mouth at bedtime.    Marland Kitchen atorvastatin (LIPITOR) 40 MG tablet Take 1 tablet (40 mg total) by mouth daily. 90 tablet 1  . cyanocobalamin (,VITAMIN B-12,) 1000 MCG/ML injection Inject 1,000 mcg into the muscle every 30 (thirty) days.    . hydrochlorothiazide (HYDRODIURIL) 25 MG tablet Take 1 tablet (25 mg total) by mouth daily. 90 tablet 1  . HYDROcodone-acetaminophen (NORCO) 5-325 MG tablet Take 1-2 tablets by mouth every 4 (four) hours as needed for moderate pain. (Patient not taking: Reported on 06/20/2019) 40 tablet 0   No current facility-administered medications for this visit.     Review of Systems : See HPI for pertinent positives and negatives.  Physical Examination  Vitals:   06/20/19 1525 06/20/19 1527  BP: (!) 144/77 139/73  Pulse: 64 65  Resp: 18   Temp: 98.4 F (36.9 C)   TempSrc: Temporal   SpO2: 99%   Weight: 138 lb (62.6 kg)   Height: _0  (1.727 m)    Body mass index is 20.98 kg/m.  General: WDWN slender male in NAD GAIT: normal Eyes: PERRLA HENT: No gross abnormalities.   Pulmonary:  Respirations are non-labored, good air movement in all fields, CTAB, no rales, rhonchi, or wheezes Cardiac: regular rhythm, no detected murmur.  VASCULAR EXAM Carotid Bruits Right Left   Negative Positive     Abdominal aortic pulse is not palpable. Radial pulses are 2+ palpable and equal.                                                                                                                            LE Pulses Right Left       POPLITEAL  1+ palpable   not palpable       POSTERIOR TIBIAL  2+ palpable   2+ palpable        DORSALIS PEDIS      ANTERIOR TIBIAL 2+ palpable  2+ palpable     Gastrointestinal: soft, nontender, BS WNL, no r/g, no palpable masses. Musculoskeletal: no muscle atrophy/wasting. M/S 5/5 throughout, extremities without ischemic changes. Dressings on left fingers 2, 3, and 4. Skin: No rashes, no ulcers, no cellulitis.   Neurologic:  A&O X 3; appropriate affect, sensation is normal; speech is normal, CN 2-12 intact except has some hearing loss, pain and light touch intact in extremities, motor exam as listed above. Psychiatric: Normal thought content, mood appropriate to clinical situation.    DATA  Carotid Duplex (06-20-19): Right Carotid: Velocities in the right ICA are consistent with a 1-39% stenosis.                The ECA appears >50% stenosed. Left Carotid: Velocities in the left ICA are consistent with a 1-39% stenosis.               The ECA appears >50% stenosed. Vertebrals:  Bilateral vertebral arteries demonstrate antegrade flow. Subclavians: Normal flow hemodynamics were  seen in bilateral subclavian arteries. No significant change compared to the exam on 12-21-17.   Assessment: Jeffrey [name] is a 78 y.o. male who is s/p left CEA in April 2010. He has no history of stroke of TIA.  Carotid duplex today shows 1-39% stenosis of the bilateral ICA. No significant change compared to the exam on 12-21-17.   His atherosclerotic  risk factors includecurrent smoker x 60+ years. Over 3 minutes was spent counseling patient re smoking cessation, and patient was given several free resources re smoking cessation.   Fortunately he does not have DM. He takes a daily 81 mg ASA and a statin.    Plan: Follow-up in 18 months with Carotid Duplex scan.   I discussed in depth with the patient the nature of atherosclerosis, and emphasized the importance of maximal medical management including strict control of blood pressure, blood glucose, and lipid levels, obtaining regular exercise, and cessation of smoking.  The patient is aware that without maximal medical management the underlying atherosclerotic disease process will progress, limiting the benefit of any interventions. The patient was given information about stroke prevention and what symptoms should prompt the patient to seek immediate medical care. Thank you for allowing [place] to participate in this patient's care.  Clemon Chambers, RN, MSN, FNP-C [facility] Office: [phone]  Clinic Physician: Donzetta Matters  06/20/19 3:55 PM

## 2019-07-01 ENCOUNTER — Ambulatory Visit (INDEPENDENT_AMBULATORY_CARE_PROVIDER_SITE_OTHER): Payer: Medicare Other

## 2019-07-01 ENCOUNTER — Other Ambulatory Visit: Payer: Self-pay

## 2019-07-01 DIAGNOSIS — E538 Deficiency of other specified B group vitamins: Secondary | ICD-10-CM | POA: Diagnosis not present

## 2019-07-01 DIAGNOSIS — Z23 Encounter for immunization: Secondary | ICD-10-CM | POA: Diagnosis not present

## 2019-07-01 MED ORDER — CYANOCOBALAMIN 1000 MCG/ML IJ SOLN
1000.0000 ug | Freq: Once | INTRAMUSCULAR | Status: AC
  Administered 2019-07-01: 14:00:00 1000 ug via INTRAMUSCULAR

## 2019-07-01 NOTE — Progress Notes (Signed)
Jeffrey [name] is a 78 y.o. male presents to the office today for monthly Vitamn B12 injections, per physician's orders.  Vitamin B12 1042mcg IM was administered left arm today. Patient tolerated injection.  Patient next injection due: 08/01/19, appt made No  [name] S, CMA  Dr.Kuneff, please sign off in PCP absence.

## 2019-07-15 ENCOUNTER — Encounter: Payer: Self-pay | Admitting: Family Medicine

## 2019-10-01 ENCOUNTER — Encounter: Payer: Medicare Other | Admitting: Family Medicine

## 2020-03-08 ENCOUNTER — Other Ambulatory Visit: Payer: Self-pay

## 2020-03-08 ENCOUNTER — Encounter: Payer: Self-pay | Admitting: Family Medicine

## 2020-03-08 ENCOUNTER — Ambulatory Visit: Payer: Medicare PPO | Admitting: Family Medicine

## 2020-03-08 ENCOUNTER — Ambulatory Visit: Payer: Medicare PPO

## 2020-03-08 VITALS — BP 162/106 | HR 48 | Temp 98.4°F | Resp 16 | Ht 68.0 in | Wt 145.6 lb

## 2020-03-08 DIAGNOSIS — E538 Deficiency of other specified B group vitamins: Secondary | ICD-10-CM

## 2020-03-08 DIAGNOSIS — N183 Chronic kidney disease, stage 3 unspecified: Secondary | ICD-10-CM

## 2020-03-08 DIAGNOSIS — I10 Essential (primary) hypertension: Secondary | ICD-10-CM | POA: Diagnosis not present

## 2020-03-08 DIAGNOSIS — E7800 Pure hypercholesterolemia, unspecified: Secondary | ICD-10-CM

## 2020-03-08 DIAGNOSIS — Z0000 Encounter for general adult medical examination without abnormal findings: Secondary | ICD-10-CM | POA: Diagnosis not present

## 2020-03-08 DIAGNOSIS — K760 Fatty (change of) liver, not elsewhere classified: Secondary | ICD-10-CM

## 2020-03-08 DIAGNOSIS — I739 Peripheral vascular disease, unspecified: Secondary | ICD-10-CM

## 2020-03-08 DIAGNOSIS — R7303 Prediabetes: Secondary | ICD-10-CM

## 2020-03-08 LAB — CBC WITH DIFFERENTIAL/PLATELET
Basophils Absolute: 0.1 10*3/uL (ref 0.0–0.1)
Basophils Relative: 1 % (ref 0.0–3.0)
Eosinophils Absolute: 0.3 10*3/uL (ref 0.0–0.7)
Eosinophils Relative: 4.2 % (ref 0.0–5.0)
HCT: 43.3 % (ref 39.0–52.0)
Hemoglobin: 14.8 g/dL (ref 13.0–17.0)
Lymphocytes Relative: 18.9 % (ref 12.0–46.0)
Lymphs Abs: 1.4 10*3/uL (ref 0.7–4.0)
MCHC: 34.2 g/dL (ref 30.0–36.0)
MCV: 86.6 fl (ref 78.0–100.0)
Monocytes Absolute: 0.6 10*3/uL (ref 0.1–1.0)
Monocytes Relative: 8.1 % (ref 3.0–12.0)
Neutro Abs: 5 10*3/uL (ref 1.4–7.7)
Neutrophils Relative %: 67.8 % (ref 43.0–77.0)
Platelets: 197 10*3/uL (ref 150.0–400.0)
RBC: 5 Mil/uL (ref 4.22–5.81)
RDW: 14.9 % (ref 11.5–15.5)
WBC: 7.4 10*3/uL (ref 4.0–10.5)

## 2020-03-08 LAB — COMPREHENSIVE METABOLIC PANEL
ALT: 6 U/L (ref 0–53)
AST: 10 U/L (ref 0–37)
Albumin: 4.4 g/dL (ref 3.5–5.2)
Alkaline Phosphatase: 91 U/L (ref 39–117)
BUN: 11 mg/dL (ref 6–23)
CO2: 30 mEq/L (ref 19–32)
Calcium: 9.8 mg/dL (ref 8.4–10.5)
Chloride: 101 mEq/L (ref 96–112)
Creatinine, Ser: 1.32 mg/dL (ref 0.40–1.50)
GFR: 52.61 mL/min — ABNORMAL LOW (ref >60.00)
Glucose, Bld: 89 mg/dL (ref 70–99)
Potassium: 4.3 mEq/L (ref 3.5–5.1)
Sodium: 138 mEq/L (ref 135–145)
Total Bilirubin: 0.6 mg/dL (ref 0.2–1.2)
Total Protein: 7.1 g/dL (ref 6.0–8.3)

## 2020-03-08 LAB — LIPID PANEL
Cholesterol: 244 mg/dL — ABNORMAL HIGH (ref 0–200)
HDL: 46 mg/dL (ref >39.00)
LDL Cholesterol: 176 mg/dL — ABNORMAL HIGH (ref 0–99)
NonHDL: 198.49
Total CHOL/HDL Ratio: 5
Triglycerides: 111 mg/dL (ref 0.0–149.0)
VLDL: 22.2 mg/dL (ref 0.0–40.0)

## 2020-03-08 LAB — HEMOGLOBIN A1C: Hgb A1c MFr Bld: 5.9 % (ref 4.6–6.5)

## 2020-03-08 LAB — VITAMIN B12: Vitamin B-12: 370 pg/mL (ref 211–911)

## 2020-03-08 MED ORDER — FELODIPINE ER 10 MG PO TB24: 10.0000 mg | ORAL_TABLET | Freq: Every day | ORAL | 1 refills | Status: DC

## 2020-03-08 NOTE — Patient Instructions (Signed)
I have prescribed an additional bp med for you today: felodipine. Check your blood pressure and heart rate once a day and write these numbers down. Bring them with you when you return to see me in 55month.

## 2020-03-08 NOTE — Progress Notes (Signed)
Office Note 03/08/2020  CC:  Chief Complaint  Patient presents with  . Follow-up    RCI, pt is fasting    HPI:  Jeffrey [name] is a 79 y.o. male who is here for annual health maintenance exam + f/u HTN, HLD, prediabetes, CRI II/III, and COPD with ongoing tobacco dependence. He does have hx of carotid artery dz as well as hx of MI and mild/mod AI.  Checked his bp daily for 1-2 wks after last visit and he can't recollect any numbers. Hasn't checked his bp at home at all since then. Pretty compliant with hctz. He still smokes 1/2 pack per day: "I can't quit to save my life". Avoids NSAIDs, doesn't drink fluids very well. HLD: takes statin daily.  No B12 inj since 03/2019--here.  He has never given this injection at home.  Has never been on oral b12 supp.  He push mows grass, weedeats, walks up and down hills on his land and has not unusual SOB/DOE, no CP.  ROS: no fevers, no CP, no SOB, no wheezing, no cough, no dizziness, no HAs, no rashes, no melena/hematochezia.  No polyuria or polydipsia.  No myalgias or arthralgias.  No focal weakness. Has some chronic L hand fingers numbness secondary to hand injury requiring lots of surgery. Slight bilat hand tremor, r a bit > l.    No acute vision or hearing abnormalities. No n/v/d or abd pain.  No palpitations.     Past Medical History:  Diagnosis Date  . Adenomatous colon polyp    Dr. Laural Golden (28 polyps on 1st endo, 3 on 2nd endo a year later.  8 on colonoscopy 02/26/14--recall 3 yrs.  Multiple adenomatous polyps on 09/20/17 TCS--recall 1 yr.. 11/2018 +aden pol->recall 3 yrs.  . Aortic insufficiency    echo 03/25/12-EF>55%, mild-mod Aortic regurg, sclerotic aortic valve,   . BPH (benign prostatic hyperplasia)    Dr. Lum Babe (pt is asymptomatic)  . Carotid artery occlusion    carotid dopplers 02/06/12-patent left carotid endarterectomy; right internal carotid 40-59%.  Repeat dopplers 03/2017, 40-59% stenosed bilat int ca's. Rpt 2019 and  2020->1-39% ica sten->rpt 18 mo.  . Chronic renal insufficiency, stage II (mild)    Borderline stage II/III--CrCl @ 62.  No RAS on 2003 angiogram (done for + renal artery dopplers--deemed eventually to have been a false positive).  Dr. Gwenlyn Found.  . DDD (degenerative disc disease), lumbar 02/2013   with spondylosis and moderate spinal stenosis+ left L5 and S1 nerve root impingement  . Ectatic abdominal aorta ([place]) 08/08/2016   2.8 cm at widest point: repeat aortic u/s 08/2021.  Marland Kitchen Essential tremor   . Fatty liver 2006   Noted on abd u/s and renal u/s 2006/2007  . History of hyperkalemia 02/2017   mild; recommended pt cut his ARB in half.  . History of MI (myocardial infarction)    Large fixed inferior defect on myocardial perfusion imaging 03/2017.  Marland Kitchen Hyperlipidemia    Dr. Percival Spanish started statin 03/2017.    Marland Kitchen Hypertension   . PAD (peripheral artery disease) ([place])    carotids: carotid endarterectomy 2010.  Dr. Percival Spanish started pt on statin for CV risk reduction 03/2017  . Plantar wart of left foot 12/21/2012  . Prediabetes 2019   A1c 5.9%  . SOB (shortness of breath)    MET test 03/25/12-mildly abnormal  . Solitary pulmonary nodule 03/14/2013   14m RUL 03/08/13--stable on repeat 07/2013, 03/2015, and 01/2016.  No additional imaging is required.  . Tobacco dependence  ongoing as of 02/2016  . Vitamin B12 deficiency without anemia 2016   Intrinsic factor NEG: home vit B12 injections started 11/17/14    Past Surgical History:  Procedure Laterality Date  . AMPUTATION FINGER Left 05/16/2019   Procedure: Revision Amutation of Left  fourth finger;  Surgeon: Roseanne Kaufman, MD;  Location: [place];  Service: Orthopedics;  Laterality: Left;  . APPENDECTOMY  remote  . Arm surgery  July 2010   Left arm cyst/Lipoma  . CARDIOVASCULAR STRESS TEST  03/2017   Low risk study (likely artifact seen, but no reversible ischemia, EF 59%, no wall motion abnormality.  . carotid dopplers  02/2015; 02/2016;12/2017;06/2019    2017:  R ICA 1-39%, L ICA 40-59%: no change from 2016. 03/2017 rpt 40-59% bilat ICA stenosis. 12/2017 1-39% bilat ICA stenosis.  06/2019->same. Rpt 18 mo.  Marland Kitchen CAROTID ENDARTERECTOMY  11/25/08   Left     ICA  . COLONOSCOPY N/A 02/26/2014   Recall 3 yrs: (+ polypectomy--tubular adenoma) Procedure: COLONOSCOPY;  Surgeon: Rogene Houston, MD;  [facility];  Service: Endoscopy;  Laterality: N/A;  830  . COLONOSCOPY N/A 09/20/2017   Multiple adenomatous polyps: recall 1 yr.  Procedure: COLONOSCOPY;  Surgeon: Rogene Houston, MD;  [facility];  Service: Endoscopy;  Laterality: N/A;  730  . COLONOSCOPY N/A 11/21/2018   Procedure: COLONOSCOPY;  Surgeon: Rogene Houston, MD;  [facility];  Service: Endoscopy;  Laterality: N/A;  930  . COLONOSCOPY W/ POLYPECTOMY  09/20/2017; 11/21/18   2019 Adenomatous polyp: recall 3-5 yrs (Dr. Laural Golden).  11/2018-adenomatous polyp->recall 3 yrs  . I & D EXTREMITY Left 05/16/2019   Procedure: IRRIGATION AND DEBRIDEMENT OF LEFT INDEX, MIDDLE,FOURTH FINGERS;  Surgeon: Roseanne Kaufman, MD;  Location: [place];  Service: Orthopedics;  Laterality: Left;  . NAILBED REPAIR Left 05/16/2019   Procedure: Reconstruction of bone and nail bed Left Index finger;  Surgeon: Roseanne Kaufman, MD;  Location: [place];  Service: Orthopedics;  Laterality: Left;  . OPEN REDUCTION INTERNAL FIXATION (ORIF) HAND Left 05/16/2019   Procedure: Reconstruction and Open Reduction and Internal fixation and Rotation flap Left Middle finger;  Surgeon: Roseanne Kaufman, MD;  Location: [place];  Service: Orthopedics;  Laterality: Left;  . POLYPECTOMY  09/20/2017   Procedure: POLYPECTOMY;  Surgeon: Rogene Houston, MD;  [facility];  Service: Endoscopy;;  Ascending colon, Transverse colon, Hepatic flexure, Cecum  . POLYPECTOMY  11/21/2018   Procedure: POLYPECTOMY;  Surgeon: Rogene Houston, MD;  [facility];  Service: Endoscopy;;  . TONSILLECTOMY  remote  .  TONSILLECTOMY    . TRANSTHORACIC ECHOCARDIOGRAM  03/2012   2013 --EF 55%, normal LV syst fxn, impaired diast relaxation, mild/mod aortic regurg.  Marland Kitchen UMBILICAL HERNIA REPAIR      Family History  Problem Relation Age of Onset  . Coronary artery disease Mother   . Stroke Mother   . Diabetes Mother   . Hypertension Mother   . Parkinson's disease Mother   . Stroke Sister   . Cancer Brother   . Heart disease Brother   . Colon cancer Neg Hx     Social History   Socioeconomic History  . Marital status: Married    Spouse name: Not on file  . Number of children: Not on file  . Years of education: Not on file  . Highest education level: Not on file  Occupational History  . Not on file  Tobacco Use  .  Smoking status: Current Every Day Smoker    Packs/day: 1.00    Years: 60.00    Pack years: 60.00    Types: Cigarettes  . Smokeless tobacco: Never Used  Vaping Use  . Vaping Use: Never used  Substance and Sexual Activity  . Alcohol use: No    Alcohol/week: 0.0 standard drinks  . Drug use: No  . Sexual activity: Not on file  Other Topics Concern  . Not on file  Social History Narrative   Married, 2 children.   Orig from [place].   Retired from [employer].   Current smoker; 1 ppd (x 50 yrs).  No hx alc, no drugs.   Active person, no formal exercise.   Social Determinants of Health   Financial Resource Strain:   . Difficulty of Paying Living Expenses:   Food Insecurity:   . Worried About Charity fundraiser in the Last Year:   . Arboriculturist in the Last Year:   Transportation Needs:   . Film/video editor (Medical):   Marland Kitchen Lack of Transportation (Non-Medical):   Physical Activity:   . Days of Exercise per Week:   . Minutes of Exercise per Session:   Stress:   . Feeling of Stress :   Social Connections:   . Frequency of Communication with Friends and Family:   . Frequency of Social Gatherings with Friends and Family:   . Attends Religious Services:    . Active Member of Clubs or Organizations:   . Attends Archivist Meetings:   Marland Kitchen Marital Status:   Intimate Partner Violence:   . Fear of Current or Ex-Partner:   . Emotionally Abused:   Marland Kitchen Physically Abused:   . Sexually Abused:     Outpatient Medications Prior to Visit  Medication Sig Dispense Refill  . acetaminophen (TYLENOL) 500 MG tablet Take 1,000 mg by mouth every 6 (six) hours as needed for moderate pain.    Marland Kitchen aspirin EC 81 MG tablet Take 1 tablet (81 mg total) by mouth at bedtime.    Marland Kitchen atorvastatin (LIPITOR) 40 MG tablet Take 1 tablet (40 mg total) by mouth daily. 90 tablet 1  . hydrochlorothiazide (HYDRODIURIL) 25 MG tablet Take 1 tablet (25 mg total) by mouth daily. 90 tablet 1  . cyanocobalamin (,VITAMIN B-12,) 1000 MCG/ML injection Inject 1,000 mcg into the muscle every 30 (thirty) days. (Patient not taking: Reported on 03/08/2020)    . HYDROcodone-acetaminophen (NORCO) 5-325 MG tablet Take 1-2 tablets by mouth every 4 (four) hours as needed for moderate pain. (Patient not taking: Reported on 06/20/2019) 40 tablet 0   No facility-administered medications prior to visit.    Allergies  Allergen Reactions  . Metoprolol Other (See Comments)    Fatigue   . Other   . Clonidine Derivatives Rash    ROS See above  PE; Blood pressure (!) 169/78, pulse (!) 48, temperature 98.4 F (36.9 C), temperature source Temporal, resp. rate 16, height 5' 8"  (1.727 m), weight 145 lb 9.6 oz (66 kg), SpO2 100 %. Body mass index is 22.14 kg/m.  Initial bp with automated cuff 169/78 today Repeat bp 162/106 w/manual cuff.  Gen: Alert, well appearing.  Patient is oriented to person, place, time, and situation. AFFECT: pleasant, lucid thought and speech. ENT: Ears: EACs clear, normal epithelium.  TMs with good light reflex and landmarks bilaterally.  Eyes: no injection, icteris, swelling, or exudate.  EOMI, PERRLA. Nose: no drainage or turbinate edema/swelling.  No  injection or  focal lesion.  Mouth: lips without lesion/swelling.  Oral mucosa pink and moist.  Dentures in place, gingiva normal.  Oropharynx without erythema, exudate, or swelling.  Neck: supple/nontender.  No LAD, mass, or TM.  Carotid pulses 2+ bilaterally, without bruits. CV: RRR, soft systolic murmur heard best at apex, no r/g.  No diastolic murmur.   LUNGS: CTA bilat, some purse-lipping on exhalation, aeration diminished on exhalation.  Nonlabored resps.  Symmetric aeration. ABD: soft, NT, ND, BS normal.  No hepatospenomegaly or mass.  No bruits. EXT: no clubbing, cyanosis, or edema.  Musculoskeletal: no joint swelling, erythema, warmth, or tenderness.  ROM of all joints intact. Skin - no sores or suspicious lesions or rashes or color changes Neuro: CN 2-12 intact bilaterally, strength 5/5 in proximal and distal upper extremities and lower extremities bilaterally.  No sensory deficits.  Mild R> L tremor with arms held up in front of him.  FNF normal.  No pill rolling.  No cogwheeling.  No shuffling.  No disdiadochokinesis.  No ataxia.  Upper extremity and lower extremity DTRs symmetric.  No pronator drift.   Pertinent labs:  Lab Results  Component Value Date   TSH 3.96 12/10/2017   Lab Results  Component Value Date   WBC 8.5 05/16/2019   HGB 13.7 05/16/2019   HCT 41.9 05/16/2019   MCV 89.9 05/16/2019   PLT 211 05/16/2019   Lab Results  Component Value Date   CREATININE 1.35 (H) 05/16/2019   BUN 11 05/16/2019   NA 138 05/16/2019   K 3.8 05/16/2019   CL 102 05/16/2019   CO2 26 05/16/2019   Lab Results  Component Value Date   ALT 7 09/26/2018   AST 12 09/26/2018   ALKPHOS 88 09/26/2018   BILITOT 0.6 09/26/2018   Lab Results  Component Value Date   CHOL 156 09/26/2018   Lab Results  Component Value Date   HDL 44.40 09/26/2018   Lab Results  Component Value Date   LDLCALC 88 09/26/2018   Lab Results  Component Value Date   TRIG 118.0 09/26/2018   Lab Results  Component  Value Date   CHOLHDL 4 09/26/2018   Lab Results  Component Value Date   PSA 0.85 12/23/2012   Lab Results  Component Value Date   HGBA1C 5.9 12/10/2017   Lab Results  Component Value Date   VITAMINB12 635 05/15/2017    ASSESSMENT AND PLAN:   1) Uncontrolled HTN: add felodipine 28m qd. Therapeutic expectations and side effect profile of medication discussed today.  Patient's questions answered. Lytes/cr today.  2) HLD: tolerating statin. FLP and hepatic panel today.  3) COPD and tob abuse: no signif breathing issues. Encouraged complete cessation.  He prefers to try to gradually cut back.  4) Prediabetes: HbA1c today, fasting gluc today.  5) Vit B12 def: no supp in 1 yr. Check level today.  6) CRI II/III: avoids NSAIDs. Needs to improve clear fluid intake. Lytes/cr today. CBC today.  7)Health maintenance exam: Reviewed age and gender appropriate health maintenance issues (prudent diet, regular exercise, health risks of tobacco and excessive alcohol, use of seatbelts, fire alarms in home, use of sunscreen).  Also reviewed age and gender appropriate health screening as well as vaccine recommendations. Labs: Vit B12 level, CBC w/diff, CMET, FLP, HbA1c. Next colonoscopy due 2023. No further prostate ca screening indicated due to age. All vaccines UTD.  An After Visit Summary was printed and given to the patient.  FOLLOW UP:  No follow-ups on file.  Signed:  Crissie Sickles, MD           03/08/2020

## 2020-03-09 ENCOUNTER — Other Ambulatory Visit: Payer: Self-pay

## 2020-03-09 DIAGNOSIS — E7800 Pure hypercholesterolemia, unspecified: Secondary | ICD-10-CM

## 2020-03-09 MED ORDER — ATORVASTATIN CALCIUM 80 MG PO TABS: 80.0000 mg | ORAL_TABLET | Freq: Every day | ORAL | 2 refills | Status: DC

## 2020-04-07 ENCOUNTER — Ambulatory Visit (INDEPENDENT_AMBULATORY_CARE_PROVIDER_SITE_OTHER): Payer: Medicare PPO

## 2020-04-07 ENCOUNTER — Other Ambulatory Visit: Payer: Self-pay

## 2020-04-07 DIAGNOSIS — E538 Deficiency of other specified B group vitamins: Secondary | ICD-10-CM | POA: Diagnosis not present

## 2020-04-07 MED ORDER — CYANOCOBALAMIN 1000 MCG/ML IJ SOLN
1000.0000 ug | Freq: Once | INTRAMUSCULAR | Status: AC
  Administered 2020-04-07: 1000 ug via INTRAMUSCULAR

## 2020-04-07 NOTE — Progress Notes (Signed)
Jeffrey [name] a 79 y.o.malepresents to the office today for monthly Vitamn B12injections, per physician's orders.  Vitamin B12 1051mcg IMwas administered left armtoday. Patient tolerated injection.  Patient next injection due:05/08/2020 appt madeNo  Caroll Rancher LPN   Lab work and visit were completed on 03/08/2020 for Vit B12. Should this patient continue with monthly injections? Please advise?

## 2020-04-08 NOTE — Progress Notes (Signed)
Pt with vit B12 deficiency.  Agree with vit B12 1000 mcg IM in office today. Signed:  Phil Euretha Najarro, MD           04/08/2020  

## 2020-04-21 ENCOUNTER — Other Ambulatory Visit: Payer: Self-pay

## 2020-04-21 ENCOUNTER — Encounter: Payer: Self-pay | Admitting: Family Medicine

## 2020-04-21 ENCOUNTER — Ambulatory Visit (INDEPENDENT_AMBULATORY_CARE_PROVIDER_SITE_OTHER): Payer: Medicare PPO | Admitting: Family Medicine

## 2020-04-21 VITALS — BP 154/72 | HR 47 | Temp 97.5°F | Resp 16 | Ht 68.0 in | Wt 143.2 lb

## 2020-04-21 DIAGNOSIS — H6123 Impacted cerumen, bilateral: Secondary | ICD-10-CM | POA: Diagnosis not present

## 2020-04-21 DIAGNOSIS — I10 Essential (primary) hypertension: Secondary | ICD-10-CM

## 2020-04-21 DIAGNOSIS — E7800 Pure hypercholesterolemia, unspecified: Secondary | ICD-10-CM | POA: Diagnosis not present

## 2020-04-21 NOTE — Progress Notes (Addendum)
OFFICE VISIT  04/21/2020   CC:  Chief Complaint  Patient presents with  . Follow-up    HTN, pt is not fasting   HPI:    Patient is a 79 y.o. Caucasian male who presents for f/u HTN and HLD. He has CAD and PAD as well as CRI II/III. He is a longtime cig smoker.  Last visit I increased his lipitor to 42m qd and added felodipine 163mqd.  INTERIM HX: Feeling well.  No side effects from meds, although he is a bit confused about what he takes--he's not sure which bp med he's taking but insists he is only taking ONE. Busy with daily activities, walks dog, push moWater engineer Reviewed daily bp's from the last 1 month: range syst 107-182, avg 145. Diast range 58-87, avg 75. HR range 48-74, avg 60.  Has heartburn daily, morning baking soda helping consistently.  Has cut back on cigs, down from 1 ppd to 1/2 pack day.  Chronic hearing loss + ears feel full of wax, +hx of needing cerumen irrigated/extracted.  ROS: no fevers, no CP, no SOB, no wheezing, no cough, no dizziness, no HAs, no rashes, no melena/hematochezia.  No polyuria or polydipsia.  No myalgias or arthralgias.  No focal weakness, paresthesias, or tremors.  No acute vision c/o. No n/v/d or abd pain.  No palpitations.    Past Medical History:  Diagnosis Date  . Adenomatous colon polyp    Dr. ReLaural Golden30 polyps on 1st endo, 3 on 2nd endo a year later.  8 on colonoscopy 02/26/14--recall 3 yrs.  Multiple adenomatous polyps on 09/20/17 TCS--recall 1 yr.. 11/2018 +aden pol->recall 3 yrs.  . Aortic insufficiency    echo 03/25/12-EF>55%, mild-mod Aortic regurg, sclerotic aortic valve,   . BPH (benign prostatic hyperplasia)    Dr. JaLum Babept is asymptomatic)  . Carotid artery occlusion    carotid dopplers 02/06/12-patent left carotid endarterectomy; right internal carotid 40-59%.  Repeat dopplers 03/2017, 40-59% stenosed bilat int ca's. Rpt 2019 and 2020->1-39% ica sten->rpt 18 mo.  . Chronic renal insufficiency, stage II (mild)     Borderline stage II/III--CrCl @ 62.  No RAS on 2003 angiogram (done for + renal artery dopplers--deemed eventually to have been a false positive).  Dr. BeGwenlyn Found . DDD (degenerative disc disease), lumbar 02/2013   with spondylosis and moderate spinal stenosis+ left L5 and S1 nerve root impingement  . [place]   2.8 cm at widest point: repeat aortic u/s 08/2021.  . Marland Kitchenssential tremor   . Fatty liver 2006   Noted on abd u/s and renal u/s 2006/2007  . History of hyperkalemia 02/2017   mild; recommended pt cut his ARB in half.  . History of MI (myocardial infarction)    Large fixed inferior defect on myocardial perfusion imaging 03/2017.  . Marland Kitchenyperlipidemia    Dr. HoPercival Spanishtarted statin 03/2017.    . Marland Kitchenypertension   . PAD (peripheral artery disease) ([facility]   carotids: carotid endarterectomy 2010.  Dr. HoPercival Spanishtarted pt on statin for CV risk reduction 03/2017  . Plantar wart of left foot 12/21/2012  . Prediabetes 2019   A1c 5.9%  . SOB (shortness of breath)    MET test 03/25/12-mildly abnormal  . Solitary pulmonary nodule 03/14/2013   56m42mUL 03/08/13--stable on repeat 07/2013, 03/2015, and 01/2016.  No additional imaging is required.  . Tobacco dependence    ongoing as of 02/2016  . Vitamin B12 deficiency without anemia 2016  Intrinsic factor NEG: home vit B12 injections started 11/17/14    Past Surgical History:  Procedure Laterality Date  . AMPUTATION FINGER Left 05/16/2019   Procedure: Revision Amutation of Left  fourth finger;  Surgeon: Roseanne Kaufman, MD;  Location: [facility];  Service: Orthopedics;  Laterality: Left;  . APPENDECTOMY  remote  . Arm surgery  July 2010   Left arm cyst/Lipoma  . CARDIOVASCULAR STRESS TEST  03/2017   Low risk study (likely artifact seen, but no reversible ischemia, EF 59%, no wall motion abnormality.  . carotid dopplers  02/2015; 02/2016;12/2017;06/2019   2017:  R ICA 1-39%, L ICA 40-59%: no change from 2016. 03/2017 rpt 40-59% bilat  ICA stenosis. 12/2017 1-39% bilat ICA stenosis.  06/2019->same. Rpt 18 mo.  Marland Kitchen CAROTID ENDARTERECTOMY  11/25/08   Left     ICA  . COLONOSCOPY N/A 02/26/2014   Recall 3 yrs: (+ polypectomy--tubular adenoma) Procedure: COLONOSCOPY;  Surgeon: Rogene Houston, MD;  [facility];  Service: Endoscopy;  Laterality: N/A;  830  . COLONOSCOPY N/A 09/20/2017   Multiple adenomatous polyps: recall 1 yr.  Procedure: COLONOSCOPY;  Surgeon: Rogene Houston, MD;  [facility];  Service: Endoscopy;  Laterality: N/A;  730  . COLONOSCOPY N/A 11/21/2018   Procedure: COLONOSCOPY;  Surgeon: Rogene Houston, MD;  [facility];  Service: Endoscopy;  Laterality: N/A;  930  . COLONOSCOPY W/ POLYPECTOMY  09/20/2017; 11/21/18   2019 Adenomatous polyp: recall 3-5 yrs (Dr. Laural Golden).  11/2018-adenomatous polyp->recall 3 yrs  . I & D EXTREMITY Left 05/16/2019   Procedure: IRRIGATION AND DEBRIDEMENT OF LEFT INDEX, MIDDLE,FOURTH FINGERS;  Surgeon: Roseanne Kaufman, MD;  Location: [place];  Service: Orthopedics;  Laterality: Left;  . NAILBED REPAIR Left 05/16/2019   Procedure: Reconstruction of bone and nail bed Left Index finger;  Surgeon: Roseanne Kaufman, MD;  Location: [place];  Service: Orthopedics;  Laterality: Left;  . OPEN REDUCTION INTERNAL FIXATION (ORIF) HAND Left 05/16/2019   Procedure: Reconstruction and Open Reduction and Internal fixation and Rotation flap Left Middle finger;  Surgeon: Roseanne Kaufman, MD;  Location: [place];  Service: Orthopedics;  Laterality: Left;  . POLYPECTOMY  09/20/2017   Procedure: POLYPECTOMY;  Surgeon: Rogene Houston, MD;  [facility];  Service: Endoscopy;;  Ascending colon, Transverse colon, Hepatic flexure, Cecum  . POLYPECTOMY  11/21/2018   Procedure: POLYPECTOMY;  Surgeon: Rogene Houston, MD;  [facility];  Service: Endoscopy;;  . TONSILLECTOMY  remote  . TONSILLECTOMY    . TRANSTHORACIC ECHOCARDIOGRAM  03/2012   2013 --EF 55%, normal LV syst  fxn, impaired diast relaxation, mild/mod aortic regurg.  Marland Kitchen UMBILICAL HERNIA REPAIR      Outpatient Medications Prior to Visit  Medication Sig Dispense Refill  . aspirin EC 81 MG tablet Take 1 tablet (81 mg total) by mouth at bedtime.    Marland Kitchen atorvastatin (LIPITOR) 80 MG tablet Take 1 tablet (80 mg total) by mouth daily. 30 tablet 2  . cyanocobalamin (,VITAMIN B-12,) 1000 MCG/ML injection Inject 1,000 mcg into the muscle every 30 (thirty) days.     . felodipine (PLENDIL) 10 MG 24 hr tablet Take 1 tablet (10 mg total) by mouth daily. 30 tablet 1  . hydrochlorothiazide (HYDRODIURIL) 25 MG tablet Take 1 tablet (25 mg total) by mouth daily. 90 tablet 1  . acetaminophen (TYLENOL) 500 MG tablet Take 1,000 mg by mouth every 6 (six) hours as needed for moderate pain. (Patient not  taking: Reported on 04/21/2020)    . HYDROcodone-acetaminophen (NORCO) 5-325 MG tablet Take 1-2 tablets by mouth every 4 (four) hours as needed for moderate pain. (Patient not taking: Reported on 04/21/2020) 40 tablet 0   No facility-administered medications prior to visit.    Allergies  Allergen Reactions  . Metoprolol Other (See Comments)    Fatigue   . Other   . Clonidine Derivatives Rash    ROS As per HPI  PE: Vitals with BMI 04/21/2020 03/08/2020 03/08/2020  Height 5' 8" - 5' 8"  Weight 143 lbs 3 oz - 145 lbs 10 oz  BMI 24.82 - 50.03  Systolic 704 888 916  Diastolic 72 945 78  Pulse 47 - 48  O2 sat on RA today is 100%   Gen: Alert, well appearing.  Patient is oriented to person, place, time, and situation. AFFECT: pleasant, lucid thought and speech. Very hard of hearing. EARS: both EACs with 1% cerumen impaction. CV: RRR, soft syst murmur, no r/g.   LUNGS: CTA bilat, nonlabored resps, good aeration in all lung fields. EXT: no clubbing or cyanosis.  no edema.    LABS:  Lab Results  Component Value Date   TSH 3.96 12/10/2017   Lab Results  Component Value Date   WBC 7.4 03/08/2020   HGB 14.8  03/08/2020   HCT 43.3 03/08/2020   MCV 86.6 03/08/2020   PLT 197.0 03/08/2020   Lab Results  Component Value Date   CREATININE 1.32 03/08/2020   BUN 11 03/08/2020   NA 138 03/08/2020   K 4.3 03/08/2020   CL 101 03/08/2020   CO2 30 03/08/2020   Lab Results  Component Value Date   ALT 6 03/08/2020   AST 10 03/08/2020   ALKPHOS 91 03/08/2020   BILITOT 0.6 03/08/2020   Lab Results  Component Value Date   CHOL 244 (H) 03/08/2020   Lab Results  Component Value Date   HDL 46.00 03/08/2020   Lab Results  Component Value Date   LDLCALC 176 (H) 03/08/2020   Lab Results  Component Value Date   TRIG 111.0 03/08/2020   Lab Results  Component Value Date   CHOLHDL 5 03/08/2020   Lab Results  Component Value Date   PSA 0.85 12/23/2012   Lab Results  Component Value Date   HGBA1C 5.9 03/08/2020    IMPRESSION AND PLAN:  1) Uncontrolled HTN. Needs to restart HCTZ 52m that he mistakenly stopped taking (we called pharmacy while he was here today and confirmed that he has not filled hctz since getting a 90d supply Feb 2021). Continue felodipine 169mqd. Continue home bp monitoring. No labs today.  2) HLD: tolerating recent inc dose atorva--80 qd. Not completely fasting today.  3) Bilat complete cerumen impaction: irrigated today. R is about 60-70% clear after irrig, I can see TM fine now. L is still 100% impacted, cannot view TM Refer to ENT.  Plan recheck cmet and flp at next f/u in 4-6 wk.  An After Visit Summary was printed and given to the patient.  FOLLOW UP: Return for f/u HTN and HLD 4-6 wks.  Signed:  PhCrissie SicklesMD           04/21/2020

## 2020-04-21 NOTE — Addendum Note (Signed)
Addended by: Tammi Sou on: 04/21/2020 11:07 AM   Modules accepted: Orders

## 2020-05-03 DIAGNOSIS — Z20828 Contact with and (suspected) exposure to other viral communicable diseases: Secondary | ICD-10-CM | POA: Diagnosis not present

## 2020-05-05 ENCOUNTER — Ambulatory Visit: Payer: Medicare PPO

## 2020-05-07 ENCOUNTER — Ambulatory Visit (INDEPENDENT_AMBULATORY_CARE_PROVIDER_SITE_OTHER): Payer: Medicare PPO | Admitting: Family Medicine

## 2020-05-07 ENCOUNTER — Other Ambulatory Visit: Payer: Self-pay

## 2020-05-07 DIAGNOSIS — E538 Deficiency of other specified B group vitamins: Secondary | ICD-10-CM | POA: Diagnosis not present

## 2020-05-07 MED ORDER — CYANOCOBALAMIN 1000 MCG/ML IJ SOLN
1000.0000 ug | Freq: Once | INTRAMUSCULAR | Status: AC
  Administered 2020-05-07: 1000 ug via INTRAMUSCULAR

## 2020-05-07 NOTE — Progress Notes (Signed)
Jeffrey [name] Crouchis a 79 y.o.malepresents to the office today for monthly Vitamn B12injections, per physician's orders.  Vitamin B12 1026mcg IMwas administered left armtoday. Patient tolerated injection.  Patient next injection due:06/08/2020 appt made  Lattie Haw, [place]

## 2020-05-26 ENCOUNTER — Ambulatory Visit: Payer: Medicare PPO | Admitting: Family Medicine

## 2020-05-28 ENCOUNTER — Other Ambulatory Visit: Payer: Self-pay

## 2020-05-28 ENCOUNTER — Ambulatory Visit: Payer: Medicare PPO | Admitting: Family Medicine

## 2020-05-28 ENCOUNTER — Encounter: Payer: Self-pay | Admitting: Family Medicine

## 2020-05-28 VITALS — BP 117/67 | HR 69 | Temp 98.0°F

## 2020-05-28 DIAGNOSIS — E7800 Pure hypercholesterolemia, unspecified: Secondary | ICD-10-CM | POA: Diagnosis not present

## 2020-05-28 DIAGNOSIS — Z23 Encounter for immunization: Secondary | ICD-10-CM

## 2020-05-28 DIAGNOSIS — I10 Essential (primary) hypertension: Secondary | ICD-10-CM

## 2020-05-28 DIAGNOSIS — N183 Chronic kidney disease, stage 3 unspecified: Secondary | ICD-10-CM

## 2020-05-28 NOTE — Progress Notes (Signed)
OFFICE VISIT  05/28/2020  CC:  Chief Complaint  Patient presents with  . Follow-up  . Hypertension    HPI:    Patient is a 79 y.o. Caucasian male who presents for f/u HTN, HLD, and CRI III.  HLD: last visit LDL was up to 176 and I increased his atorva to 80 mg qd. No side effects. He has cut way back on cig smoking.  BP today great, no home monitoring being done. He avoids NSAIDs. He drinks NO water at all.  Drinks sugary soft drinks all day.  Big glass of mild nightly. About 16 oz cup of coffee.    ROS: no fevers, no CP, no SOB, no wheezing, no cough, no dizziness, no HAs, no rashes, no melena/hematochezia.  No polyuria or polydipsia.  No myalgias or arthralgias.  No focal weakness, paresthesias, or tremors.  No acute vision or hearing abnormalities. No n/v/d or abd pain.  No palpitations.     Past Medical History:  Diagnosis Date  . Adenomatous colon polyp    Dr. [name] (28 polyps on 1st endo, 3 on 2nd endo a year later.  8 on colonoscopy 02/26/14--recall 3 yrs.  Multiple adenomatous polyps on 09/20/17 TCS--recall 1 yr.. 11/2018 +aden pol->recall 3 yrs.  . Aortic insufficiency    echo 03/25/12-EF>55%, mild-mod Aortic regurg, sclerotic aortic valve,   . BPH (benign prostatic hyperplasia)    Dr. Jaived (pt is asymptomatic)  . Carotid artery occlusion    carotid dopplers 02/06/12-patent left carotid endarterectomy; right internal carotid 40-59%.  Repeat dopplers 03/2017, 40-59% stenosed bilat int ca's. Rpt 2019 and 2020->1-39% ica sten->rpt 18 mo.  . Chronic renal insufficiency, stage II (mild)    Borderline stage II/III--CrCl @ 60.  No RAS on 2003 angiogram (done for + renal artery dopplers--deemed eventually to have been a false positive).  Dr. Berry.  . DDD (degenerative disc disease), lumbar 02/2013   with spondylosis and moderate spinal stenosis+ left L5 and S1 nerve root impingement  . Ectatic abdominal aorta (HCC) 08/08/2016   2.8 cm at widest point: repeat aortic u/s  08/2021.  . Essential tremor   . Fatty liver 2006   Noted on abd u/s and renal u/s 2006/2007  . History of hyperkalemia 02/2017   mild; recommended pt cut his ARB in half.  . History of MI (myocardial infarction)    Large fixed inferior defect on myocardial perfusion imaging 03/2017.  . Hyperlipidemia    Dr. Hochrein started statin 03/2017.    . Hypertension   . PAD (peripheral artery disease) (HCC)    carotids: carotid endarterectomy 2010.  Dr. Hochrein started pt on statin for CV risk reduction 03/2017  . Plantar wart of left foot 12/21/2012  . Prediabetes 2019   A1c 5.9%  . SOB (shortness of breath)    MET test 03/25/12-mildly abnormal  . Solitary pulmonary nodule 03/14/2013   7mm RUL 03/08/13--stable on repeat 07/2013, 03/2015, and 01/2016.  No additional imaging is required.  . Tobacco dependence    ongoing as of 02/2016  . Vitamin B12 deficiency without anemia 2016   Intrinsic factor NEG: home vit B12 injections started 11/17/14    Past Surgical History:  Procedure Laterality Date  . AMPUTATION FINGER Left 05/16/2019   Procedure: Revision Amutation of Left  fourth finger;  Surgeon: Gramig, William, MD;  Location: [facility];  Service: Orthopedics;  Laterality: Left;  . APPENDECTOMY  remote  . Arm surgery  July 2010   Left arm cyst/Lipoma  .   CARDIOVASCULAR STRESS TEST  03/2017   Low risk study (likely artifact seen, but no reversible ischemia, EF 59%, no wall motion abnormality.  . carotid dopplers  02/2015; 02/2016;12/2017;06/2019   2017:  R ICA 1-39%, L ICA 40-59%: no change from 2016. 03/2017 rpt 40-59% bilat ICA stenosis. 12/2017 1-39% bilat ICA stenosis.  06/2019->same. Rpt 18 mo.  Marland Kitchen CAROTID ENDARTERECTOMY  11/25/08   Left     ICA  . COLONOSCOPY N/A 02/26/2014   Recall 3 yrs: (+ polypectomy--tubular adenoma) Procedure: COLONOSCOPY;  Surgeon: Rogene Houston, MD;  [facility]: [facility];  Service: Endoscopy;  Laterality: N/A;  830  . COLONOSCOPY N/A 09/20/2017   Multiple adenomatous polyps:  recall 1 yr.  Procedure: COLONOSCOPY;  Surgeon: Rogene Houston, MD;  [facility]: [facility];  Service: Endoscopy;  Laterality: N/A;  730  . COLONOSCOPY N/A 11/21/2018   Procedure: COLONOSCOPY;  Surgeon: Rogene Houston, MD;  [facility];  Service: Endoscopy;  Laterality: N/A;  930  . COLONOSCOPY W/ POLYPECTOMY  09/20/2017; 11/21/18   2019 Adenomatous polyp: recall 3-5 yrs (Dr. Laural Golden).  11/2018-adenomatous polyp->recall 3 yrs  . I & D EXTREMITY Left 05/16/2019   Procedure: IRRIGATION AND DEBRIDEMENT OF LEFT INDEX, MIDDLE,FOURTH FINGERS;  Surgeon: Roseanne Kaufman, MD;  Location: [facility];  Service: Orthopedics;  Laterality: Left;  . NAILBED REPAIR Left 05/16/2019   Procedure: Reconstruction of bone and nail bed Left Index finger;  Surgeon: Roseanne Kaufman, MD;  Location: [facility];  Service: Orthopedics;  Laterality: Left;  . OPEN REDUCTION INTERNAL FIXATION (ORIF) HAND Left 05/16/2019   Procedure: Reconstruction and Open Reduction and Internal fixation and Rotation flap Left Middle finger;  Surgeon: Roseanne Kaufman, MD;  Location: [facility];  Service: Orthopedics;  Laterality: Left;  . POLYPECTOMY  09/20/2017   Procedure: POLYPECTOMY;  Surgeon: Rogene Houston, MD;  [facility];  Service: Endoscopy;;  Ascending colon, Transverse colon, Hepatic flexure, Cecum  . POLYPECTOMY  11/21/2018   Procedure: POLYPECTOMY;  Surgeon: Rogene Houston, MD;  [facility];  Service: Endoscopy;;  . TONSILLECTOMY  remote  . TONSILLECTOMY    . TRANSTHORACIC ECHOCARDIOGRAM  03/2012   2013 --EF 55%, normal LV syst fxn, impaired diast relaxation, mild/mod aortic regurg.  Marland Kitchen UMBILICAL HERNIA REPAIR      Outpatient Medications Prior to Visit  Medication Sig Dispense Refill  . acetaminophen (TYLENOL) 500 MG tablet Take 1,000 mg by mouth every 6 (six) hours as needed for moderate pain.     Marland Kitchen aspirin EC 81 MG tablet Take 1 tablet (81 mg total) by mouth at bedtime.    Marland Kitchen atorvastatin (LIPITOR) 80 MG  tablet Take 1 tablet (80 mg total) by mouth daily. 30 tablet 2  . cyanocobalamin (,VITAMIN B-12,) 1000 MCG/ML injection Inject 1,000 mcg into the muscle every 30 (thirty) days.     . felodipine (PLENDIL) 10 MG 24 hr tablet Take 1 tablet (10 mg total) by mouth daily. 30 tablet 1  . hydrochlorothiazide (HYDRODIURIL) 25 MG tablet Take 1 tablet (25 mg total) by mouth daily. 90 tablet 1   No facility-administered medications prior to visit.    Allergies  Allergen Reactions  . Metoprolol Other (See Comments)    Fatigue   . Other   . Clonidine Derivatives Rash    ROS As per HPI  PE: Vitals with BMI 05/28/2020 04/21/2020 03/08/2020  Height - 5' 8" -  Weight - 143 lbs 3 oz -  BMI - 21.78 -  Systolic 373 428 768  Diastolic 67 72 115  Pulse 69 47 -     Gen: Alert, well appearing.  Patient is oriented to person, place, time, and situation. AFFECT: pleasant, lucid thought and speech. No further exam today.  LABS:  Lab Results  Component Value Date   TSH 3.96 12/10/2017   Lab Results  Component Value Date   WBC 7.4 03/08/2020   HGB 14.8 03/08/2020   HCT 43.3 03/08/2020   MCV 86.6 03/08/2020   PLT 197.0 03/08/2020   Lab Results  Component Value Date   CREATININE 1.32 03/08/2020   BUN 11 03/08/2020   NA 138 03/08/2020   K 4.3 03/08/2020   CL 101 03/08/2020   CO2 30 03/08/2020   Lab Results  Component Value Date   ALT 6 03/08/2020   AST 10 03/08/2020   ALKPHOS 91 03/08/2020   BILITOT 0.6 03/08/2020   Lab Results  Component Value Date   CHOL 244 (H) 03/08/2020   Lab Results  Component Value Date   HDL 46.00 03/08/2020   Lab Results  Component Value Date   LDLCALC 176 (H) 03/08/2020   Lab Results  Component Value Date   TRIG 111.0 03/08/2020   Lab Results  Component Value Date   CHOLHDL 5 03/08/2020   Lab Results  Component Value Date   PSA 0.85 12/23/2012   Lab Results  Component Value Date   HGBA1C 5.9 03/08/2020    IMPRESSION AND PLAN:  1)  HLD: tolerating increased dose of atorva for the last 3 mo. He'll get FLP and hepatic panel on 9/27 when he comes for his B12 inj.  2) HTN: needs to restart home monitoring.  BP great here today. No changes.  BMET on 9/27--ordered.  3) CRI III: avoids NSAIDs but DEFINITELY NEEDS TO HYDRATE BETTER.  I EMPHASIZED THIS TO HIM TODAY.  An After Visit Summary was printed and given to the patient.  FOLLOW UP: Return in about 6 months (around 11/25/2020) for annual CPE (fasting).  Signed:  Crissie Sickles, MD           05/28/2020

## 2020-06-07 ENCOUNTER — Ambulatory Visit (INDEPENDENT_AMBULATORY_CARE_PROVIDER_SITE_OTHER): Payer: Medicare PPO

## 2020-06-07 ENCOUNTER — Other Ambulatory Visit: Payer: Self-pay

## 2020-06-07 DIAGNOSIS — I10 Essential (primary) hypertension: Secondary | ICD-10-CM

## 2020-06-07 DIAGNOSIS — E538 Deficiency of other specified B group vitamins: Secondary | ICD-10-CM | POA: Diagnosis not present

## 2020-06-07 DIAGNOSIS — N183 Chronic kidney disease, stage 3 unspecified: Secondary | ICD-10-CM

## 2020-06-07 LAB — BASIC METABOLIC PANEL
BUN: 18 mg/dL (ref 6–23)
CO2: 30 mEq/L (ref 19–32)
Calcium: 10.2 mg/dL (ref 8.4–10.5)
Chloride: 98 mEq/L (ref 96–112)
Creatinine, Ser: 1.25 mg/dL (ref 0.40–1.50)
GFR: 55.98 mL/min — ABNORMAL LOW (ref >60.00)
Glucose, Bld: 102 mg/dL — ABNORMAL HIGH (ref 70–99)
Potassium: 5.5 mEq/L — ABNORMAL HIGH (ref 3.5–5.1)
Sodium: 136 mEq/L (ref 135–145)

## 2020-06-07 MED ORDER — CYANOCOBALAMIN 1000 MCG/ML IJ SOLN
1000.0000 ug | Freq: Once | INTRAMUSCULAR | Status: AC
  Administered 2020-06-07: 1000 ug via INTRAMUSCULAR

## 2020-06-07 NOTE — Progress Notes (Signed)
  Jeffrey [name] a 79 y.o.malepresents to the office today for monthly Vitamn B12injections, per physician's orders.  Vitamin B12 107mcg IMwas administered left armtoday. Patient tolerated injection.  Patient next injection due:10/27/2021appt made  [facility], [place]

## 2020-06-08 NOTE — Progress Notes (Signed)
Spoke with wife per DPR and scheduled lab appt. Placed information to be mailed today

## 2020-06-14 ENCOUNTER — Telehealth: Payer: Self-pay | Admitting: Family Medicine

## 2020-06-14 NOTE — Telephone Encounter (Signed)
Patient notified he will receive new copy for low potassium diet sheet to updated address.

## 2020-06-14 NOTE — Telephone Encounter (Signed)
Jeffrey [name] called to [facility] bc he states his grandfather never got the diet sheet. He says the PO BOX is no longer valid and to use [address] [place] [address]. I have updated the address in his demographics.

## 2020-06-18 ENCOUNTER — Ambulatory Visit: Payer: Medicare PPO

## 2020-07-07 ENCOUNTER — Other Ambulatory Visit: Payer: Self-pay

## 2020-07-07 ENCOUNTER — Ambulatory Visit (INDEPENDENT_AMBULATORY_CARE_PROVIDER_SITE_OTHER): Payer: Medicare PPO

## 2020-07-07 DIAGNOSIS — E538 Deficiency of other specified B group vitamins: Secondary | ICD-10-CM | POA: Diagnosis not present

## 2020-07-07 MED ORDER — CYANOCOBALAMIN 1000 MCG/ML IJ SOLN
1000.0000 ug | Freq: Once | INTRAMUSCULAR | Status: AC
  Administered 2020-07-07: 1000 ug via INTRAMUSCULAR

## 2020-07-07 NOTE — Progress Notes (Signed)
Jeffrey [name] a 79 y.o.malepresents to the office today for monthly Vitamn B12injections, per physician's orders.  Vitamin B12 1023mcg IMwas administered left armtoday. Patient tolerated injection.  Patient next injection due:11/27/2021appt made

## 2020-07-16 ENCOUNTER — Telehealth: Payer: Self-pay

## 2020-07-16 NOTE — Telephone Encounter (Signed)
Patient Name: Jeffrey [name] Gender: Male DOB: March 15, 1941 Age: 79 Y 3 M 7 D Return Phone Number: [phone] (Primary) Address: City/State/Zip: [place] [address] Client [facility] - Client Client Site [place] - Day Physician Crissie Sickles - MD Contact Type Call Who Is Calling Patient / Member / Family / Caregiver Call Type Triage / Clinical Caller Name Leory Allinson Relationship To Patient Daughter Return Phone Number [phone] (Primary) Chief Complaint NUMBNESS/TINGLING- sudden on one side of the body or face Reason for Call Symptomatic / Request for [place] states that her dad has no movement in his arm, he has pain in that arm and is not able to grip a cup Translation No Nurse Assessment Nurse: Vallery Sa, RN, Tye [place] Date/Time (Eastern Time): 07/16/2020 11:08:07 AM Confirm and document reason for call. If symptomatic, describe symptoms. ---Caller states that her Dad developed pain and numbness in his left arm. No severe breathing difficulty. No chest pain. Alert and responsive. Does the patient have any new or worsening symptoms? ---Yes Will a triage be completed? ---Yes Related visit to physician within the last 2 weeks? ---No Does the PT have any chronic conditions? (i.e. diabetes, asthma, this includes High risk factors for pregnancy, etc.) ---Yes List chronic conditions. ---High Blood Pressure and Cholesterol Is this a behavioral health or substance abuse call? ---No Guidelines Guideline Title Affirmed Question Affirmed Notes Nurse Date/Time (Eastern Time) Neurologic Deficit [1] Numbness (i.e., loss of sensation) of the face, arm / hand, or leg / foot on one side of the body AND [2] sudden onset AND [3] present now [place], RN, Cathy 07/16/2020 11:10:02 AM Disp. Time Eilene Ghazi Time) Disposition Final User 07/16/2020 11:03:58 AM Send to Urgent Ala Dach 07/16/2020 11:14:13 AM  911 Outcome Documentation [place], RN, Tye [place] PLEASE NOTE: All timestamps contained within this report are represented as [place] Standard Time. CONFIDENTIALTY NOTICE: This fax transmission is intended only for the addressee. It contains information that is legally privileged, confidential or otherwise protected from use or disclosure. If you are not the intended recipient, you are strictly prohibited from reviewing, disclosing, copying using or disseminating any of this information or taking any action in reliance on or regarding this information. If you have received this fax in error, please notify us immediately by telephone so that we can arrange for its return to [place]. Phone: [phone], Toll-Free: [phone], Fax: [phone] Page: 2 of 2 Call Id: [id number] [place]. Time Eilene Ghazi Time) Disposition Final User Reason: Caller declined the Call 911 direction. Reinforced the Call 911 direction. She will take him to an ER now. 07/16/2020 11:14:52 AM Call Completed Vallery Sa, RN, Cathy 07/16/2020 11:13:00 AM Call EMS 911 Now Yes Vallery Sa, RN, Rosey [place] Disagree/Comply Disagree Caller Understands Yes PreDisposition Call Doctor Care Advice Given Per Guideline CALL EMS 911 NOW: CARE ADVICE given per Neurologic Deficit (Adult) guideline. * Immediate medical attention is needed. You need to hang up and call 911 (or an ambulance). Referrals GO TO FACILITY REFUSED

## 2020-07-19 ENCOUNTER — Ambulatory Visit: Payer: Medicare PPO | Admitting: Family Medicine

## 2020-07-19 ENCOUNTER — Other Ambulatory Visit: Payer: Self-pay

## 2020-07-19 ENCOUNTER — Encounter: Payer: Self-pay | Admitting: Family Medicine

## 2020-07-19 VITALS — BP 131/81 | HR 64 | Wt 141.0 lb

## 2020-07-19 DIAGNOSIS — E7800 Pure hypercholesterolemia, unspecified: Secondary | ICD-10-CM

## 2020-07-19 DIAGNOSIS — I739 Peripheral vascular disease, unspecified: Secondary | ICD-10-CM

## 2020-07-19 DIAGNOSIS — I6523 Occlusion and stenosis of bilateral carotid arteries: Secondary | ICD-10-CM

## 2020-07-19 DIAGNOSIS — N183 Chronic kidney disease, stage 3 unspecified: Secondary | ICD-10-CM | POA: Diagnosis not present

## 2020-07-19 DIAGNOSIS — M25511 Pain in right shoulder: Secondary | ICD-10-CM | POA: Diagnosis not present

## 2020-07-19 DIAGNOSIS — Z9889 Other specified postprocedural states: Secondary | ICD-10-CM

## 2020-07-19 DIAGNOSIS — R29898 Other symptoms and signs involving the musculoskeletal system: Secondary | ICD-10-CM | POA: Diagnosis not present
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
## 2020-07-19 NOTE — Progress Notes (Signed)
See med student note above. Agree NO WEAKNESS or any other neurologic abnormality on exam.   RRR, 2/3 syst murmur radiating/referred to both carotids---no carotid bruit. Suspect some shoulder strain +/- mild brachial plexus injury affecting C8 level.  Low suspicion of CVA, but given his hx of carotid artery dz and the fact that it has been 1 yr since f/u carotid u/s I'll order this now. No new treatments.   Obs. Signed:  Crissie Sickles, MD           07/19/2020

## 2020-07-21 ENCOUNTER — Ambulatory Visit: Payer: Medicare PPO | Admitting: Family Medicine

## 2020-08-04 ENCOUNTER — Ambulatory Visit: Payer: Medicare PPO | Admitting: Family Medicine

## 2020-08-04 ENCOUNTER — Other Ambulatory Visit: Payer: Self-pay

## 2020-08-04 ENCOUNTER — Encounter: Payer: Self-pay | Admitting: Family Medicine

## 2020-08-04 ENCOUNTER — Ambulatory Visit: Payer: Medicare PPO

## 2020-08-04 VITALS — BP 124/79 | HR 62 | Temp 97.8°F | Resp 16 | Ht 68.0 in | Wt 138.2 lb

## 2020-08-04 DIAGNOSIS — E7800 Pure hypercholesterolemia, unspecified: Secondary | ICD-10-CM

## 2020-08-04 DIAGNOSIS — R5382 Chronic fatigue, unspecified: Secondary | ICD-10-CM

## 2020-08-04 DIAGNOSIS — I7300 Raynaud's syndrome without gangrene: Secondary | ICD-10-CM

## 2020-08-04 DIAGNOSIS — I10 Essential (primary) hypertension: Secondary | ICD-10-CM | POA: Diagnosis not present

## 2020-08-04 DIAGNOSIS — N183 Chronic kidney disease, stage 3 unspecified: Secondary | ICD-10-CM | POA: Diagnosis not present

## 2020-08-04 DIAGNOSIS — R7303 Prediabetes: Secondary | ICD-10-CM | POA: Diagnosis not present

## 2020-08-04 DIAGNOSIS — I739 Peripheral vascular disease, unspecified: Secondary | ICD-10-CM

## 2020-08-04 DIAGNOSIS — E538 Deficiency of other specified B group vitamins: Secondary | ICD-10-CM

## 2020-08-04 LAB — CBC WITH DIFFERENTIAL/PLATELET
Basophils Absolute: 0.1 10*3/uL (ref 0.0–0.1)
Basophils Relative: 1.1 % (ref 0.0–3.0)
Eosinophils Absolute: 0.5 10*3/uL (ref 0.0–0.7)
Eosinophils Relative: 7 % — ABNORMAL HIGH (ref 0.0–5.0)
HCT: 44.8 % (ref 39.0–52.0)
Hemoglobin: 15.1 g/dL (ref 13.0–17.0)
Lymphocytes Relative: 17.5 % (ref 12.0–46.0)
Lymphs Abs: 1.2 10*3/uL (ref 0.7–4.0)
MCHC: 33.6 g/dL (ref 30.0–36.0)
MCV: 88.6 fl (ref 78.0–100.0)
Monocytes Absolute: 0.5 10*3/uL (ref 0.1–1.0)
Monocytes Relative: 7.1 % (ref 3.0–12.0)
Neutro Abs: 4.8 10*3/uL (ref 1.4–7.7)
Neutrophils Relative %: 67.3 % (ref 43.0–77.0)
Platelets: 219 10*3/uL (ref 150.0–400.0)
RBC: 5.06 Mil/uL (ref 4.22–5.81)
RDW: 14.7 % (ref 11.5–15.5)
WBC: 7.1 10*3/uL (ref 4.0–10.5)

## 2020-08-04 LAB — LIPID PANEL
Cholesterol: 171 mg/dL (ref 0–200)
HDL: 44.9 mg/dL (ref >39.00)
LDL Cholesterol: 98 mg/dL (ref 0–99)
NonHDL: 125.9
Total CHOL/HDL Ratio: 4
Triglycerides: 138 mg/dL (ref 0.0–149.0)
VLDL: 27.6 mg/dL (ref 0.0–40.0)

## 2020-08-04 LAB — COMPREHENSIVE METABOLIC PANEL
ALT: 7 U/L (ref 0–53)
AST: 11 U/L (ref 0–37)
Albumin: 4.2 g/dL (ref 3.5–5.2)
Alkaline Phosphatase: 83 U/L (ref 39–117)
BUN: 15 mg/dL (ref 6–23)
CO2: 31 mEq/L (ref 19–32)
Calcium: 10.4 mg/dL (ref 8.4–10.5)
Chloride: 102 mEq/L (ref 96–112)
Creatinine, Ser: 1.44 mg/dL (ref 0.40–1.50)
GFR: 46.93 mL/min — ABNORMAL LOW (ref >60.00)
Glucose, Bld: 102 mg/dL — ABNORMAL HIGH (ref 70–99)
Potassium: 6 mEq/L — ABNORMAL HIGH (ref 3.5–5.1)
Sodium: 141 mEq/L (ref 135–145)
Total Bilirubin: 0.6 mg/dL (ref 0.2–1.2)
Total Protein: 6.9 g/dL (ref 6.0–8.3)

## 2020-08-04 LAB — HEMOGLOBIN A1C: Hgb A1c MFr Bld: 6 % (ref 4.6–6.5)

## 2020-08-04 MED ORDER — CYANOCOBALAMIN 1000 MCG/ML IJ SOLN
1000.0000 ug | Freq: Once | INTRAMUSCULAR | Status: AC
  Administered 2020-08-04: 1000 ug via INTRAMUSCULAR
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
## 2020-08-06 ENCOUNTER — Other Ambulatory Visit: Payer: Self-pay | Admitting: Family Medicine

## 2020-08-06 DIAGNOSIS — N183 Chronic kidney disease, stage 3 unspecified: Secondary | ICD-10-CM

## 2020-08-06 DIAGNOSIS — E875 Hyperkalemia: Secondary | ICD-10-CM

## 2020-08-06 MED ORDER — HYDROCHLOROTHIAZIDE 25 MG PO TABS: ORAL_TABLET | ORAL | 3 refills | Status: DC

## 2020-08-06 MED ORDER — SODIUM POLYSTYRENE SULFONATE PO POWD: ORAL | 0 refills | Status: DC

## 2020-08-09 NOTE — Progress Notes (Signed)
Spoke with wife per DPR and they will call when they come back to town to schedule nurse visit.

## 2020-08-11 ENCOUNTER — Ambulatory Visit (HOSPITAL_COMMUNITY)
Admission: RE | Admit: 2020-08-11 | Discharge: 2020-08-11 | Disposition: A | Discharge status: Home / Self Care | Payer: Medicare PPO | Source: Ambulatory Visit | Attending: Family Medicine | Admitting: Family Medicine

## 2020-08-11 ENCOUNTER — Other Ambulatory Visit: Payer: Self-pay

## 2020-08-11 DIAGNOSIS — I771 Stricture of artery: Secondary | ICD-10-CM | POA: Diagnosis not present

## 2020-08-11 DIAGNOSIS — I6523 Occlusion and stenosis of bilateral carotid arteries: Secondary | ICD-10-CM | POA: Diagnosis not present

## 2020-08-11 DIAGNOSIS — Z9889 Other specified postprocedural states: Secondary | ICD-10-CM | POA: Diagnosis not present

## 2020-08-14 ENCOUNTER — Encounter: Payer: Self-pay | Admitting: Family Medicine

## 2020-08-17 ENCOUNTER — Ambulatory Visit (HOSPITAL_COMMUNITY): Payer: Medicare PPO

## 2020-08-18 ENCOUNTER — Telehealth: Payer: Self-pay

## 2020-08-18 NOTE — Telephone Encounter (Signed)
Jeffrey [name], wife of patient (DPR) calling for results of [place]. Please call at [phone].  Thank you

## 2020-08-18 NOTE — Telephone Encounter (Signed)
Wife(DPR) returned call and advised of results.

## 2020-08-18 NOTE — Telephone Encounter (Signed)
LVM for pt to return call regarding results. "Carotid artery on the RIGHT side has a little bit more plaque on this ultrasound compared with the last couple of years. Left carotid with mild plaque and is unchanged compared to prior ultrasound. Recommend another ultrasound in 1 yr."

## 2020-09-07 ENCOUNTER — Other Ambulatory Visit: Payer: Self-pay

## 2020-09-08 ENCOUNTER — Ambulatory Visit (INDEPENDENT_AMBULATORY_CARE_PROVIDER_SITE_OTHER): Payer: Medicare PPO

## 2020-09-08 DIAGNOSIS — N183 Chronic kidney disease, stage 3 unspecified: Secondary | ICD-10-CM | POA: Diagnosis not present

## 2020-09-08 DIAGNOSIS — E7800 Pure hypercholesterolemia, unspecified: Secondary | ICD-10-CM | POA: Diagnosis not present

## 2020-09-08 DIAGNOSIS — E875 Hyperkalemia: Secondary | ICD-10-CM

## 2020-09-08 DIAGNOSIS — E538 Deficiency of other specified B group vitamins: Secondary | ICD-10-CM

## 2020-09-08 LAB — LIPID PANEL
Cholesterol: 203 mg/dL — ABNORMAL HIGH (ref 0–200)
HDL: 43.4 mg/dL (ref >39.00)
LDL Cholesterol: 135 mg/dL — ABNORMAL HIGH (ref 0–99)
NonHDL: 159.93
Total CHOL/HDL Ratio: 5
Triglycerides: 125 mg/dL (ref 0.0–149.0)
VLDL: 25 mg/dL (ref 0.0–40.0)

## 2020-09-08 LAB — HEPATIC FUNCTION PANEL
ALT: 6 U/L (ref 0–53)
AST: 9 U/L (ref 0–37)
Albumin: 4.2 g/dL (ref 3.5–5.2)
Alkaline Phosphatase: 81 U/L (ref 39–117)
Bilirubin, Direct: 0.1 mg/dL (ref 0.0–0.3)
Total Bilirubin: 0.6 mg/dL (ref 0.2–1.2)
Total Protein: 6.5 g/dL (ref 6.0–8.3)

## 2020-09-08 LAB — BASIC METABOLIC PANEL
BUN: 15 mg/dL (ref 6–23)
CO2: 30 mEq/L (ref 19–32)
Calcium: 10.1 mg/dL (ref 8.4–10.5)
Chloride: 104 mEq/L (ref 96–112)
Creatinine, Ser: 1.17 mg/dL (ref 0.40–1.50)
GFR: 60.17 mL/min (ref >60.00)
Glucose, Bld: 89 mg/dL (ref 70–99)
Potassium: 4.4 mEq/L (ref 3.5–5.1)
Sodium: 140 mEq/L (ref 135–145)

## 2020-09-08 MED ORDER — CYANOCOBALAMIN 1000 MCG/ML IJ SOLN
1000.0000 ug | Freq: Once | INTRAMUSCULAR | Status: AC
  Administered 2020-09-08: 10:00:00 1000 ug via INTRAMUSCULAR

## 2020-09-08 NOTE — Progress Notes (Signed)
Jeffrey [name] a 78 y.o.malepresents to the office today for monthly Vitamn B12injections, per physician's orders.  Vitamin B12 IMwas administered left armtoday. Patient tolerated injection.

## 2020-09-13 ENCOUNTER — Other Ambulatory Visit: Payer: Self-pay

## 2020-09-13 DIAGNOSIS — E7800 Pure hypercholesterolemia, unspecified: Secondary | ICD-10-CM

## 2020-09-13 MED ORDER — EZETIMIBE 10 MG PO TABS: 10.0000 mg | ORAL_TABLET | Freq: Every day | ORAL | 2 refills | Status: DC

## 2020-10-11 ENCOUNTER — Ambulatory Visit: Payer: Medicare PPO

## 2020-10-14 DIAGNOSIS — Z20822 Contact with and (suspected) exposure to covid-19: Secondary | ICD-10-CM | POA: Diagnosis not present

## 2020-10-26 NOTE — Progress Notes (Signed)
Subjective:   Jeffrey [name] is a 80 y.o. male who presents for Medicare Annual/Subsequent preventive examination.  I connected with [name] today by telephone and verified that I am speaking with the correct person using two identifiers. Location patient: home Location provider: work Persons participating in the virtual visit: patient, wife, Marine scientist.    I discussed the limitations, risks, security and privacy concerns of performing an evaluation and management service by telephone and the availability of in person appointments. I also discussed with the patient that there may be a patient responsible charge related to this service. The patient expressed understanding and verbally consented to this telephonic visit.    Interactive audio and video telecommunications were attempted between this provider and patient, however failed, due to patient having technical difficulties OR patient did not have access to video capability.  We continued and completed visit with audio only.  Some vital signs may be absent or patient reported.   Time Spent with patient on telephone encounter: 25 minutes   Review of Systems     Cardiac Risk Factors include: advanced age (>46mn, >>62women);male gender;hypertension;dyslipidemia;smoking/ tobacco exposure     Objective:    Today's Vitals   10/27/20 0944  Weight: 138 lb (62.6 kg)  Height: _0  (1.727 m)   Body mass index is 20.98 kg/m.  Advanced Directives 10/27/2020 05/16/2019 11/21/2018 11/21/2018 03/25/2018 12/21/2017 09/20/2017  Does Patient Have a Medical Advance Directive? _1  No Yes  Type of Advance Directive - - - - - - HPress photographerLiving will  Copy of [place] Chart? - - - - - - No - copy requested  Would patient like information on creating a medical advance directive? No - Patient declined No - Guardian declined No - Patient declined No - Patient declined Yes (MAU/Ambulatory/Procedural Areas -  Information given) Yes (MAU/Ambulatory/Procedural Areas - Information given) -  Pre-existing out of facility DNR order (yellow form or pink MOST form) - - - - - - -    Current Medications (verified) Outpatient Encounter Medications as of 10/27/2020  Medication Sig  . aspirin EC 81 MG tablet Take 1 tablet (81 mg total) by mouth at bedtime.  .Marland Kitchenatorvastatin (LIPITOR) 80 MG tablet Take 1 tablet (80 mg total) by mouth daily.  . cyanocobalamin (,VITAMIN B-12,) 1000 MCG/ML injection Inject 1,000 mcg into the muscle every 30 (thirty) days.   .Marland Kitchenezetimibe (ZETIA) 10 MG tablet Take 1 tablet (10 mg total) by mouth daily.  . felodipine (PLENDIL) 10 MG 24 hr tablet TAKE 1 TABLET(10 MG) BY MOUTH DAILY  . hydrochlorothiazide (HYDRODIURIL) 25 MG tablet 1/2 tab po qd  . sodium polystyrene (KAYEXALATE) powder 15 grams po once daily  . acetaminophen (TYLENOL) 500 MG tablet Take 1,000 mg by mouth every 6 (six) hours as needed for moderate pain.  (Patient not taking: No sig reported)   No facility-administered encounter medications on file as of 10/27/2020.    Allergies (verified) Metoprolol, Other, and Clonidine derivatives   History: Past Medical History:  Diagnosis Date  . Adenomatous colon polyp    Dr. RLaural Golden(28 polyps on 1st endo, 3 on 2nd endo a year later.  8 on colonoscopy 02/26/14--recall 3 yrs.  Multiple adenomatous polyps on 09/20/17 TCS--recall 1 yr.. 11/2018 +aden pol->recall 3 yrs.  . Aortic insufficiency    echo 03/25/12-EF>55%, mild-mod Aortic regurg, sclerotic aortic valve,   . BPH (benign prostatic hyperplasia)    Dr. JLum Babe(pt  is asymptomatic)  . Carotid artery occlusion    carotid dopplers 02/06/12-patent left carotid endarterectomy; right internal carotid 40-59%.  Repeat dopplers 03/2017, 40-59% stenosed bilat int ca's. Rpt 2019 and 2020->1-39% ica sten. 08/2020 R 50-69%, L mild->rpt 1 yr.  . Chronic renal insufficiency, stage II (mild)    Borderline stage II/III--CrCl @ 60.  No RAS on  2003 angiogram (done for + renal artery dopplers--deemed eventually to have been a false positive).  Dr. Gwenlyn Found.  . DDD (degenerative disc disease), lumbar 02/2013   with spondylosis and moderate spinal stenosis+ left L5 and S1 nerve root impingement  . Ectatic abdominal aorta ([place]) 08/08/2016   2.8 cm at widest point: repeat aortic u/s 08/2021.  Marland Kitchen Essential tremor   . Fatty liver 2006   Noted on abd u/s and renal u/s 2006/2007  . History of hyperkalemia 02/2017   mild; recommended pt cut his ARB in half.  . History of MI (myocardial infarction)    Large fixed inferior defect on myocardial perfusion imaging 03/2017.  Marland Kitchen Hyperlipidemia    Dr. Percival Spanish started statin 03/2017.    Marland Kitchen Hypertension   . PAD (peripheral artery disease) ([place])    carotids: carotid endarterectomy 2010.  Dr. Percival Spanish started pt on statin for CV risk reduction 03/2017  . Plantar wart of left foot 12/21/2012  . Prediabetes 2019   A1c 5.9%  . SOB (shortness of breath)    MET test 03/25/12-mildly abnormal  . Solitary pulmonary nodule 03/14/2013   9m RUL 03/08/13--stable on repeat 07/2013, 03/2015, and 01/2016.  No additional imaging is required.  . Tobacco dependence    ongoing as of 02/2016  . Vitamin B12 deficiency without anemia 2016   Intrinsic factor NEG: home vit B12 injections started 11/17/14   Past Surgical History:  Procedure Laterality Date  . AMPUTATION FINGER Left 05/16/2019   Procedure: Revision Amutation of Left  fourth finger;  Surgeon: GRoseanne Kaufman MD;  [facility]  Service: Orthopedics;  Laterality: Left;  . APPENDECTOMY  remote  . Arm surgery  July 2010   Left arm cyst/Lipoma  . CARDIOVASCULAR STRESS TEST  03/2017   Low risk study (likely artifact seen, but no reversible ischemia, EF 59%, no wall motion abnormality.  . carotid dopplers  02/2015; 02/2016;12/2017;06/2019   2017:  R ICA 1-39%, L ICA 40-59%: no change from 2016. 03/2017 rpt 40-59% bilat ICA stenosis. 12/2017 1-39% bilat ICA stenosis.   06/2019->same. Rpt 18 mo.  .Marland KitchenCAROTID ENDARTERECTOMY  11/25/08   Left     ICA  . COLONOSCOPY N/A 02/26/2014   Recall 3 yrs: (+ polypectomy--tubular adenoma) Procedure: COLONOSCOPY;  Surgeon: NRogene Houston MD;  [facility];  Service: Endoscopy;  Laterality: N/A;  830  . COLONOSCOPY N/A 09/20/2017   Multiple adenomatous polyps: recall 1 yr.  Procedure: COLONOSCOPY;  Surgeon: RRogene Houston MD;  [facility];  Service: Endoscopy;  Laterality: N/A;  730  . COLONOSCOPY N/A 11/21/2018   Procedure: COLONOSCOPY;  Surgeon: RRogene Houston MD;  [facility];  Service: Endoscopy;  Laterality: N/A;  930  . COLONOSCOPY W/ POLYPECTOMY  09/20/2017; 11/21/18   2019 Adenomatous polyp: recall 3-5 yrs (Dr. RLaural Golden.  11/2018-adenomatous polyp->recall 3 yrs  . I & D EXTREMITY Left 05/16/2019   Procedure: IRRIGATION AND DEBRIDEMENT OF LEFT INDEX, MIDDLE,FOURTH FINGERS;  Surgeon: GRoseanne Kaufman MD;  [place] [facility]  Service: Orthopedics;  Laterality: Left;  . NAILBED REPAIR Left 05/16/2019   Procedure: Reconstruction  of bone and nail bed Left Index finger;  Surgeon: Roseanne Kaufman, MD;  Location: [facility];  Service: Orthopedics;  Laterality: Left;  . OPEN REDUCTION INTERNAL FIXATION (ORIF) HAND Left 05/16/2019   Procedure: Reconstruction and Open Reduction and Internal fixation and Rotation flap Left Middle finger;  Surgeon: Roseanne Kaufman, MD;  Location: [place];  Service: Orthopedics;  Laterality: Left;  . POLYPECTOMY  09/20/2017   Procedure: POLYPECTOMY;  Surgeon: Rogene Houston, MD;  [facility];  Service: Endoscopy;;  Ascending colon, Transverse colon, Hepatic flexure, Cecum  . POLYPECTOMY  11/21/2018   Procedure: POLYPECTOMY;  Surgeon: Rogene Houston, MD;  [facility];  Service: Endoscopy;;  . TONSILLECTOMY  remote  . TONSILLECTOMY    . TRANSTHORACIC ECHOCARDIOGRAM  03/2012   2013 --EF 55%, normal LV syst fxn, impaired diast relaxation, mild/mod aortic  regurg.  Marland Kitchen UMBILICAL HERNIA REPAIR     Family History  Problem Relation Age of Onset  . Coronary artery disease Mother   . Stroke Mother   . Diabetes Mother   . Hypertension Mother   . Parkinson's disease Mother   . Stroke Sister   . Cancer Brother   . Heart disease Brother   . Colon cancer Neg Hx    Social History   Socioeconomic History  . Marital status: Married    Spouse name: Not on file  . Number of children: Not on file  . Years of education: Not on file  . Highest education level: Not on file  Occupational History  . Not on file  Tobacco Use  . Smoking status: Current Every Day Smoker    Packs/day: 0.25    Years: 60.00    Pack years: 15.00    Types: Cigarettes  . Smokeless tobacco: Never Used  Vaping Use  . Vaping Use: Never used  Substance and Sexual Activity  . Alcohol use: No    Alcohol/week: 0.0 standard drinks  . Drug use: No  . Sexual activity: Not on file  Other Topics Concern  . Not on file  Social History Narrative   Married, 2 children.   Orig from [place].   Retired from [employer].   Current smoker; 1 ppd (x 50 yrs).  No hx alc, no drugs.   Active person, no formal exercise.   Social Determinants of Health   Financial Resource Strain: Low Risk   . Difficulty of Paying Living Expenses: Not hard at all  Food Insecurity: No Food Insecurity  . Worried About Charity fundraiser in the Last Year: Never true  . Ran Out of Food in the Last Year: Never true  Transportation Needs: No Transportation Needs  . Lack of Transportation (Medical): No  . Lack of Transportation (Non-Medical): No  Physical Activity: Inactive  . Days of Exercise per Week: 0 days  . Minutes of Exercise per Session: 0 min  Stress: No Stress Concern Present  . Feeling of Stress : Not at all  Social Connections: Moderately Isolated  . Frequency of Communication with Friends and Family: More than three times a week  . Frequency of Social Gatherings with Friends  and Family: More than three times a week  . Attends Religious Services: Never  . Active Member of Clubs or Organizations: No  . Attends Archivist Meetings: Never  . Marital Status: Married    Tobacco Counseling Ready to quit: Not Answered Counseling given: Not Answered   Clinical Intake:  Pre-visit preparation completed:  Yes  Pain : No/denies pain     Nutritional Status: BMI of 19-24  Normal Nutritional Risks: None Diabetes: No  How often do you need to have someone help you when you read instructions, pamphlets, or other written materials from your doctor or pharmacy?: 1 - Never  Diabetic?No  Interpreter Needed?: No  Information entered by :: Caroleen Hamman LPN   Activities of Daily Living In your present state of health, do you have any difficulty performing the following activities: 10/27/2020 04/21/2020  Hearing? Y Y  Comment hearing aids -  Vision? N N  Difficulty concentrating or making decisions? N N  Walking or climbing stairs? N N  Dressing or bathing? N N  Doing errands, shopping? N N  Preparing Food and eating ? N -  Using the Toilet? N -  In the past six months, have you accidently leaked urine? N -  Do you have problems with loss of bowel control? N -  Managing your Medications? N -  Managing your Finances? N -  Housekeeping or managing your Housekeeping? N -  Some recent data might be hidden    Patient Care Team: Tammi Sou, MD as PCP - General (Family Medicine) Rogene Houston, MD as Consulting Physician (Gastroenterology) Nickel, Sharmon Leyden, NP (Inactive) as Nurse Practitioner (Vascular Surgery) Minus Breeding, MD as Consulting Physician (Cardiology)  Indicate any recent Medical Services you may have received from other than [facility] providers in the past year (date may be approximate).     Assessment:   This is a routine wellness examination for [place].  Hearing/Vision screen  Hearing Screening   _0  _1  _2  _3   _4  _5  _6  _7  _8   Right ear:           Left ear:           Comments: Has hearing aids but does not always wear them  Vision Screening Comments: Wears glasses [facility]  Dietary issues and exercise activities discussed: Current Exercise Habits: Home exercise routine;The patient does not participate in regular exercise at present, Exercise limited by: None identified  Goals    . Patient Stated     Maintain current health.       Depression Screen PHQ 2/9 Scores 10/27/2020 04/21/2020 09/26/2018 03/25/2018 09/14/2017 07/24/2016 07/24/2016  PHQ - 2 Score 0 0 0 0 1 0 0    Fall Risk Fall Risk  10/27/2020 04/21/2020 09/26/2018 03/25/2018 09/14/2017  Falls in the past year? 0 0 0 No No  Number falls in past yr: 0 0 - - -  Injury with Fall? 0 0 - - -  Follow up Falls prevention discussed Falls evaluation completed - - -    FALL RISK PREVENTION PERTAINING TO THE HOME:  Any stairs in or around the home? Yes  If so, are there any without handrails? No  Home free of loose throw rugs in walkways, pet beds, electrical cords, etc? Yes  Adequate lighting in your home to reduce risk of falls? Yes   ASSISTIVE DEVICES UTILIZED TO PREVENT FALLS:  Life alert? No  Use of a cane, walker or w/c? No  Grab bars in the bathroom? Yes  Shower chair or bench in shower? No  Elevated toilet seat or a handicapped toilet? No   TIMED UP AND GO:  Was the test performed? No . Phone visit   Cognitive Function:Unable to complete 6CIT due to difficulty hearing over the phone.  Patient's wife assisted with questions today.  Patient & wife denies any issues with  memory loss. MMSE - Mini Mental State Exam 03/25/2018  Orientation to time 5  Orientation to Place 5  Registration 3  Attention/ Calculation 5  Recall 3  Language- name 2 objects 2  Language- repeat 1  Language- follow 3 step command 3  Language- read & follow direction 1  Write a sentence 1  Copy design 1  Total  score 30        Immunizations Immunization History  Administered Date(s) Administered  . Fluad Quad(high Dose 65+) 07/01/2019, 05/28/2020  . Influenza Split 06/11/2012  . Influenza, High Dose Seasonal PF 06/21/2016, 06/15/2017, 06/03/2018  . Influenza,inj,Quad PF,6+ Mos 05/23/2013, 07/08/2014, 06/17/2015  . PFIZER(Purple Top)SARS-COV-2 Vaccination 12/18/2019, 01/09/2020  . Pneumococcal Conjugate-13 01/04/2016  . Pneumococcal Polysaccharide-23 09/12/2011  . Tdap 11/22/2017, 05/16/2019  . Zoster Recombinat (Shingrix) 11/22/2017, 02/12/2018    TDAP status: Up to date  Flu Vaccine status: Up to date  Pneumococcal vaccine status: Up to date  Covid-19 vaccine status: Information provided on how to obtain vaccines.  completed two doses-booster due  Qualifies for Shingles Vaccine? No   Zostavax completed No   Shingrix Completed?: Yes  Screening Tests Health Maintenance  Topic Date Due  . Hepatitis C Screening  Never done  . COVID-19 Vaccine (3 - Booster for Pfizer series) 07/10/2020  . COLONOSCOPY (Pts 45-18yr Insurance coverage will need to be confirmed)  11/21/2023  . TETANUS/TDAP  05/15/2029  . INFLUENZA VACCINE  Completed  . PNA vac Low Risk Adult  Completed    Health Maintenance  Health Maintenance Due  Topic Date Due  . Hepatitis C Screening  Never done  . COVID-19 Vaccine (3 - Booster for Pfizer series) 07/10/2020    Colorectal cancer screening: No longer required.   Lung Cancer Screening: (Low Dose CT Chest recommended if Age 817-80years, 30 pack-year currently smoking OR have quit w/in 15years.) does qualify.   Lung Cancer Screening Referral: To be discussed with PCP  Additional Screening:  Hepatitis C Screening: does not qualify  Vision Screening: Recommended annual ophthalmology exams for early detection of glaucoma and other disorders of the eye. Is the patient up to date with their annual eye exam?  Yes  Who is the provider or what is the name of  the office in which the patient attends annual eye exams? [facility]   Dental Screening: Recommended annual dental exams for proper oral hygiene  Community Resource Referral / Chronic Care Management: CRR required this visit?  No   CCM required this visit?  No      Plan:     I have personally reviewed and noted the following in the patient's chart:   . Medical and social history . Use of alcohol, tobacco or illicit drugs  . Current medications and supplements . Functional ability and status . Nutritional status . Physical activity . Advanced directives . List of other physicians . Hospitalizations, surgeries, and ER visits in previous 12 months . Vitals . Screenings to include cognitive, depression, and falls . Referrals and appointments  In addition, I have reviewed and discussed with patient certain preventive protocols, quality metrics, and best practice recommendations. A written personalized care plan for preventive services as well as general preventive health recommendations were provided to patient.   Due to this being a telephonic visit, the after visit summary with patients personalized plan was offered to patient via mail or my-chart. Patient would like to access on my-chart.   MJana Half  Lillie Fragmin, LPN   2/99/2426  Nurse Health Advisor  Nurse Notes: None

## 2020-10-27 ENCOUNTER — Ambulatory Visit (INDEPENDENT_AMBULATORY_CARE_PROVIDER_SITE_OTHER): Payer: Medicare PPO

## 2020-10-27 VITALS — Ht 68.0 in | Wt 138.0 lb

## 2020-10-27 DIAGNOSIS — Z0000 Encounter for general adult medical examination without abnormal findings: Secondary | ICD-10-CM | POA: Diagnosis not present

## 2020-10-27 NOTE — Patient Instructions (Signed)
Jeffrey [name] , Thank you for taking time to come for your Medicare Wellness Visit. I appreciate your ongoing commitment to your health goals. Please review the following plan we discussed and let me know if I can assist you in the future.   Screening recommendations/referrals: Colonoscopy: Completed 11/21/2018-Please follow the recommendations from the GI doctor for follow up. Recommended yearly ophthalmology/optometry visit for glaucoma screening and checkup Recommended yearly dental visit for hygiene and checkup  Vaccinations: Influenza vaccine: Up to date Pneumococcal vaccine: Completed vacines Tdap vaccine: Up to date-Due-05/15/2029 Shingles vaccine: Completed vaccines  Covid-19:Booster due-May obtain vaccine at your local pharmacy.  Advanced directives: Declined information today.  Conditions/risks identified: See problem list  Next appointment: Follow up in one year for your annual wellness visit.   Preventive Care 79 Years and Older, Male Preventive care refers to lifestyle choices and visits with your health care provider that can promote health and wellness. What does preventive care include?  A yearly physical exam. This is also called an annual well check.  Dental exams once or twice a year.  Routine eye exams. Ask your health care provider how often you should have your eyes checked.  Personal lifestyle choices, including:  Daily care of your teeth and gums.  Regular physical activity.  Eating a healthy diet.  Avoiding tobacco and drug use.  Limiting alcohol use.  Practicing safe sex.  Taking low doses of aspirin every day.  Taking vitamin and mineral supplements as recommended by your health care provider. What happens during an annual well check? The services and screenings done by your health care provider during your annual well check will depend on your age, overall health, lifestyle risk factors, and family history of disease. Counseling  Your health  care provider may ask you questions about your:  Alcohol use.  Tobacco use.  Drug use.  Emotional well-being.  Home and relationship well-being.  Sexual activity.  Eating habits.  History of falls.  Memory and ability to understand (cognition).  Work and work Statistician. Screening  You may have the following tests or measurements:  Height, weight, and BMI.  Blood pressure.  Lipid and cholesterol levels. These may be checked every 5 years, or more frequently if you are over 53 years old.  Skin check.  Lung cancer screening. You may have this screening every year starting at age 44 if you have a 30-pack-year history of smoking and currently smoke or have quit within the past 15 years.  Fecal occult blood test (FOBT) of the stool. You may have this test every year starting at age 5.  Flexible sigmoidoscopy or colonoscopy. You may have a sigmoidoscopy every 5 years or a colonoscopy every 10 years starting at age 59.  Prostate cancer screening. Recommendations will vary depending on your family history and other risks.  Hepatitis C blood test.  Hepatitis B blood test.  Sexually transmitted disease (STD) testing.  Diabetes screening. This is done by checking your blood sugar (glucose) after you have not eaten for a while (fasting). You may have this done every 1-3 years.  Abdominal aortic aneurysm (AAA) screening. You may need this if you are a current or former smoker.  Osteoporosis. You may be screened starting at age 66 if you are at high risk. Talk with your health care provider about your test results, treatment options, and if necessary, the need for more tests. Vaccines  Your health care provider may recommend certain vaccines, such as:  Influenza vaccine. This is recommended  every year.  Tetanus, diphtheria, and acellular pertussis (Tdap, Td) vaccine. You may need a Td booster every 10 years.  Zoster vaccine. You may need this after age  19.  Pneumococcal 13-valent conjugate (PCV13) vaccine. One dose is recommended after age 86.  Pneumococcal polysaccharide (PPSV23) vaccine. One dose is recommended after age 70. Talk to your health care provider about which screenings and vaccines you need and how often you need them. This information is not intended to replace advice given to you by your health care provider. Make sure you discuss any questions you have with your health care provider. Document Released: 09/24/2015 Document Revised: 05/17/2016 Document Reviewed: 06/29/2015 Elsevier Interactive Patient Education  2017 [place] Prevention in the Home Falls can cause injuries. They can happen to people of all ages. There are many things you can do to make your home safe and to help prevent falls. What can I do on the outside of my home?  Regularly fix the edges of walkways and driveways and fix any cracks.  Remove anything that might make you trip as you walk through a door, such as a raised step or threshold.  Trim any bushes or trees on the path to your home.  Use bright outdoor lighting.  Clear any walking paths of anything that might make someone trip, such as rocks or tools.  Regularly check to see if handrails are loose or broken. Make sure that both sides of any steps have handrails.  Any raised decks and porches should have guardrails on the edges.  Have any leaves, snow, or ice cleared regularly.  Use sand or salt on walking paths during winter.  Clean up any spills in your garage right away. This includes oil or grease spills. What can I do in the bathroom?  Use night lights.  Install grab bars by the toilet and in the tub and shower. Do not use towel bars as grab bars.  Use non-skid mats or decals in the tub or shower.  If you need to sit down in the shower, use a plastic, non-slip stool.  Keep the floor dry. Clean up any water that spills on the floor as soon as it happens.  Remove  soap buildup in the tub or shower regularly.  Attach bath mats securely with double-sided non-slip rug tape.  Do not have throw rugs and other things on the floor that can make you trip. What can I do in the bedroom?  Use night lights.  Make sure that you have a light by your bed that is easy to reach.  Do not use any sheets or blankets that are too big for your bed. They should not hang down onto the floor.  Have a firm chair that has side arms. You can use this for support while you get dressed.  Do not have throw rugs and other things on the floor that can make you trip. What can I do in the kitchen?  Clean up any spills right away.  Avoid walking on wet floors.  Keep items that you use a lot in easy-to-reach places.  If you need to reach something above you, use a strong step stool that has a grab bar.  Keep electrical cords out of the way.  Do not use floor polish or wax that makes floors slippery. If you must use wax, use non-skid floor wax.  Do not have throw rugs and other things on the floor that can make you trip. What  can I do with my stairs?  Do not leave any items on the stairs.  Make sure that there are handrails on both sides of the stairs and use them. Fix handrails that are broken or loose. Make sure that handrails are as long as the stairways.  Check any carpeting to make sure that it is firmly attached to the stairs. Fix any carpet that is loose or worn.  Avoid having throw rugs at the top or bottom of the stairs. If you do have throw rugs, attach them to the floor with carpet tape.  Make sure that you have a light switch at the top of the stairs and the bottom of the stairs. If you do not have them, ask someone to add them for you. What else can I do to help prevent falls?  Wear shoes that:  Do not have high heels.  Have rubber bottoms.  Are comfortable and fit you well.  Are closed at the toe. Do not wear sandals.  If you use a  stepladder:  Make sure that it is fully opened. Do not climb a closed stepladder.  Make sure that both sides of the stepladder are locked into place.  Ask someone to hold it for you, if possible.  Clearly mark and make sure that you can see:  Any grab bars or handrails.  First and last steps.  Where the edge of each step is.  Use tools that help you move around (mobility aids) if they are needed. These include:  Canes.  Walkers.  Scooters.  Crutches.  Turn on the lights when you go into a dark area. Replace any light bulbs as soon as they burn out.  Set up your furniture so you have a clear path. Avoid moving your furniture around.  If any of your floors are uneven, fix them.  If there are any pets around you, be aware of where they are.  Review your medicines with your doctor. Some medicines can make you feel dizzy. This can increase your chance of falling. Ask your doctor what other things that you can do to help prevent falls. This information is not intended to replace advice given to you by your health care provider. Make sure you discuss any questions you have with your health care provider. Document Released: 06/24/2009 Document Revised: 02/03/2016 Document Reviewed: 10/02/2014 Elsevier Interactive Patient Education  2017 [employer].

## 2020-11-06 ENCOUNTER — Other Ambulatory Visit: Payer: Self-pay | Admitting: Family Medicine

## 2020-12-23 ENCOUNTER — Other Ambulatory Visit: Payer: Self-pay

## 2020-12-23 ENCOUNTER — Ambulatory Visit
Admission: EM | Admit: 2020-12-23 | Discharge: 2020-12-23 | Disposition: A | Discharge status: Home / Self Care | Payer: Medicare PPO | Attending: Internal Medicine | Admitting: Internal Medicine

## 2020-12-23 ENCOUNTER — Encounter: Payer: Self-pay | Admitting: Emergency Medicine

## 2020-12-23 DIAGNOSIS — L03113 Cellulitis of right upper limb: Secondary | ICD-10-CM

## 2020-12-23 DIAGNOSIS — H6123 Impacted cerumen, bilateral: Secondary | ICD-10-CM | POA: Diagnosis not present

## 2020-12-23 DIAGNOSIS — S40269A Insect bite (nonvenomous) of unspecified shoulder, initial encounter: Secondary | ICD-10-CM

## 2020-12-23 DIAGNOSIS — W57XXXA Bitten or stung by nonvenomous insect and other nonvenomous arthropods, initial encounter: Secondary | ICD-10-CM

## 2020-12-23 MED ORDER — DOXYCYCLINE HYCLATE 100 MG PO CAPS: 100.0000 mg | ORAL_CAPSULE | Freq: Two times a day (BID) | ORAL | 0 refills | Status: DC

## 2020-12-23 NOTE — ED Provider Notes (Signed)
[facility]    CSN: [id number] Arrival date & time: 12/23/20  1832      History   Chief Complaint Chief Complaint  Patient presents with  . Insect Bite    HPI Jeffrey [name] is a 80 y.o. male who presents today due to having a tick bite yesterday on his R upper arm and now is swollen and red. 2- L ear feels stopped up x  1 year and PCP could not get it washed out.     Past Medical History:  Diagnosis Date  . Adenomatous colon polyp    Dr. Laural Golden (28 polyps on 1st endo, 3 on 2nd endo a year later.  8 on colonoscopy 02/26/14--recall 3 yrs.  Multiple adenomatous polyps on 09/20/17 TCS--recall 1 yr.. 11/2018 +aden pol->recall 3 yrs.  . Aortic insufficiency    echo 03/25/12-EF>55%, mild-mod Aortic regurg, sclerotic aortic valve,   . BPH (benign prostatic hyperplasia)    Dr. Lum Babe (pt is asymptomatic)  . Carotid artery occlusion    carotid dopplers 02/06/12-patent left carotid endarterectomy; right internal carotid 40-59%.  Repeat dopplers 03/2017, 40-59% stenosed bilat int ca's. Rpt 2019 and 2020->1-39% ica sten. 08/2020 R 50-69%, L mild->rpt 1 yr.  . Chronic renal insufficiency, stage II (mild)    Borderline stage II/III--CrCl @ 60.  No RAS on 2003 angiogram (done for + renal artery dopplers--deemed eventually to have been a false positive).  Dr. Gwenlyn Found.  . DDD (degenerative disc disease), lumbar 02/2013   with spondylosis and moderate spinal stenosis+ left L5 and S1 nerve root impingement  . Ectatic abdominal aorta ([place]) 08/08/2016   2.8 cm at widest point: repeat aortic u/s 08/2021.  Marland Kitchen Essential tremor   . Fatty liver 2006   Noted on abd u/s and renal u/s 2006/2007  . History of hyperkalemia 02/2017   mild; recommended pt cut his ARB in half.  . History of MI (myocardial infarction)    Large fixed inferior defect on myocardial perfusion imaging 03/2017.  Marland Kitchen Hyperlipidemia    Dr. Percival Spanish started statin 03/2017.    Marland Kitchen Hypertension   . PAD (peripheral artery disease) ([place])     carotids: carotid endarterectomy 2010.  Dr. Percival Spanish started pt on statin for CV risk reduction 03/2017  . Plantar wart of left foot 12/21/2012  . Prediabetes 2019   A1c 5.9%  . SOB (shortness of breath)    MET test 03/25/12-mildly abnormal  . Solitary pulmonary nodule 03/14/2013   41m RUL 03/08/13--stable on repeat 07/2013, 03/2015, and 01/2016.  No additional imaging is required.  . Tobacco dependence    ongoing as of 02/2016  . Vitamin B12 deficiency without anemia 2016   Intrinsic factor NEG: home vit B12 injections started 11/17/14    Patient Active Problem List   Diagnosis Date Noted  . Laceration of finger of left hand 05/29/2019  . Open fracture of distal phalanx of finger 05/29/2019  . Pain of left hand 05/29/2019  . Hx of colonic polyps 10/23/2018  . Chronic renal insufficiency, stage II (mild)   . History of colonic polyps 03/12/2017  . Essential tremor   . Vitamin B12 deficiency 12/10/2014  . Peripheral neuropathy 11/22/2014  . Occlusion and stenosis of carotid artery without mention of cerebral infarction-Left 01/07/2014  . Hyperlipidemia 11/24/2013  . Cerumen impaction 11/24/2013  . Adenomatous colon polyp   . Dyspnea 04/01/2013  . Labile hypertension 03/14/2013  . Solitary pulmonary nodule 03/14/2013  . Tobacco dependence 03/14/2013  . Left leg pain  02/10/2013  . Essential hypertension, benign 02/10/2013  . Health maintenance examination 12/23/2012    Past Surgical History:  Procedure Laterality Date  . AMPUTATION FINGER Left 05/16/2019   Procedure: Revision Amutation of Left  fourth finger;  Surgeon: Roseanne Kaufman, MD;  Location: [place];  Service: Orthopedics;  Laterality: Left;  . APPENDECTOMY  remote  . Arm surgery  July 2010   Left arm cyst/Lipoma  . CARDIOVASCULAR STRESS TEST  03/2017   Low risk study (likely artifact seen, but no reversible ischemia, EF 59%, no wall motion abnormality.  . carotid dopplers  02/2015; 02/2016;12/2017;06/2019   2017:  R ICA  1-39%, L ICA 40-59%: no change from 2016. 03/2017 rpt 40-59% bilat ICA stenosis. 12/2017 1-39% bilat ICA stenosis.  06/2019->same. Rpt 18 mo.  Marland Kitchen CAROTID ENDARTERECTOMY  11/25/08   Left     ICA  . COLONOSCOPY N/A 02/26/2014   Recall 3 yrs: (+ polypectomy--tubular adenoma) Procedure: COLONOSCOPY;  Surgeon: Rogene Houston, MD;  [facility];  Service: Endoscopy;  Laterality: N/A;  830  . COLONOSCOPY N/A 09/20/2017   Multiple adenomatous polyps: recall 1 yr.  Procedure: COLONOSCOPY;  Surgeon: Rogene Houston, MD;  [facility];  Service: Endoscopy;  Laterality: N/A;  730  . COLONOSCOPY N/A 11/21/2018   Procedure: COLONOSCOPY;  Surgeon: Rogene Houston, MD;  [facility];  Service: Endoscopy;  Laterality: N/A;  930  . COLONOSCOPY W/ POLYPECTOMY  09/20/2017; 11/21/18   2019 Adenomatous polyp: recall 3-5 yrs (Dr. Laural Golden).  11/2018-adenomatous polyp->recall 3 yrs  . I & D EXTREMITY Left 05/16/2019   Procedure: IRRIGATION AND DEBRIDEMENT OF LEFT INDEX, MIDDLE,FOURTH FINGERS;  Surgeon: Roseanne Kaufman, MD;  Location: [place];  Service: Orthopedics;  Laterality: Left;  . NAILBED REPAIR Left 05/16/2019   Procedure: Reconstruction of bone and nail bed Left Index finger;  Surgeon: Roseanne Kaufman, MD;  Location: [place];  Service: Orthopedics;  Laterality: Left;  . OPEN REDUCTION INTERNAL FIXATION (ORIF) HAND Left 05/16/2019   Procedure: Reconstruction and Open Reduction and Internal fixation and Rotation flap Left Middle finger;  Surgeon: Roseanne Kaufman, MD;  Location: [place];  Service: Orthopedics;  Laterality: Left;  . POLYPECTOMY  09/20/2017   Procedure: POLYPECTOMY;  Surgeon: Rogene Houston, MD;  [facility];  Service: Endoscopy;;  Ascending colon, Transverse colon, Hepatic flexure, Cecum  . POLYPECTOMY  11/21/2018   Procedure: POLYPECTOMY;  Surgeon: Rogene Houston, MD;  [facility];  Service: Endoscopy;;  . TONSILLECTOMY  remote  . TONSILLECTOMY    .  TRANSTHORACIC ECHOCARDIOGRAM  03/2012   2013 --EF 55%, normal LV syst fxn, impaired diast relaxation, mild/mod aortic regurg.  Marland Kitchen UMBILICAL HERNIA REPAIR         Home Medications    Prior to Admission medications   Medication Sig Start Date End Date Taking? Authorizing Provider  acetaminophen (TYLENOL) 500 MG tablet Take 1,000 mg by mouth every 6 (six) hours as needed for moderate pain.  Patient not taking: No sig reported    [provider]  aspirin EC 81 MG tablet Take 1 tablet (81 mg total) by mouth at bedtime. 11/28/18   [name], Mechele Dawley, MD  atorvastatin (LIPITOR) 80 MG tablet TAKE 1 TABLET(80 MG) BY MOUTH DAILY 11/09/20   McGowen, Adrian Blackwater, MD  cyanocobalamin (,VITAMIN B-12,) 1000 MCG/ML injection Inject 1,000 mcg into the muscle every 30 (thirty) days.     [provider]  ezetimibe (ZETIA) 10 MG  tablet Take 1 tablet (10 mg total) by mouth daily. 09/13/20   McGowen, Adrian Blackwater, MD  felodipine (PLENDIL) 10 MG 24 hr tablet TAKE 1 TABLET(10 MG) BY MOUTH DAILY 08/06/20   McGowen, Adrian Blackwater, MD  hydrochlorothiazide (HYDRODIURIL) 25 MG tablet 1/2 tab po qd 08/06/20   McGowen, Adrian Blackwater, MD  sodium polystyrene (KAYEXALATE) powder 15 grams po once daily 08/06/20   McGowen, Adrian Blackwater, MD    Family History Family History  Problem Relation Age of Onset  . Coronary artery disease Mother   . Stroke Mother   . Diabetes Mother   . Hypertension Mother   . Parkinson's disease Mother   . Stroke Sister   . Cancer Brother   . Heart disease Brother   . Colon cancer Neg Hx     Social History Social History   Tobacco Use  . Smoking status: Current Every Day Smoker    Packs/day: 0.25    Years: 60.00    Pack years: 15.00    Types: Cigarettes  . Smokeless tobacco: Never Used  Vaping Use  . Vaping Use: Never used  Substance Use Topics  . Alcohol use: No    Alcohol/week: 0.0 standard drinks  . Drug use: No     Allergies   Metoprolol, Other, and Clonidine  derivatives   Review of Systems Review of Systems  Constitutional: Positive for chills and fever.  HENT: Positive for hearing loss. Negative for congestion, ear discharge and ear pain.   Eyes: Negative for discharge.  Musculoskeletal: Negative for arthralgias, joint swelling and myalgias.  Skin: Positive for color change and rash.  Neurological: Negative for numbness.  Hematological: Negative for adenopathy.     Physical Exam Triage Vital Signs ED Triage Vitals  Enc Vitals Group     BP 12/23/20 1902 (!) 192/81     Pulse Rate 12/23/20 1902 (!) 109     Resp 12/23/20 1902 17     Temp 12/23/20 1902 98 F (36.7 C)     Temp Source 12/23/20 1902 Oral     SpO2 12/23/20 1902 97 %     Weight --      Height --      Head Circumference --      Peak Flow --      Pain Score 12/23/20 1859 0     Pain Loc --      Pain Edu? --      Excl. in [place]? --    No data found.  Updated Vital Signs BP (!) 192/81 (BP Location: Right Arm)   Pulse (!) 109   Temp 98 F (36.7 C) (Oral)   Resp 17   SpO2 97%   Visual Acuity Right Eye Distance:   Left Eye Distance:   Bilateral Distance:    Right Eye Near:   Left Eye Near:    Bilateral Near:     Physical Exam Vitals and nursing note reviewed.  Constitutional:      General: He is not in acute distress.    Appearance: Normal appearance. He is not toxic-appearing.     Comments: Hard of hearing  HENT:     Right Ear: There is impacted cerumen.     Left Ear: There is impacted cerumen.  Eyes:     General: No scleral icterus.    Conjunctiva/sclera: Conjunctivae normal.  Pulmonary:     Effort: Pulmonary effort is normal.  Musculoskeletal:        General: Normal range of motion.  Cervical back: Neck supple.  Skin:    General: Skin is warm and dry.     Findings: No rash.     Comments: Has bite site on R upper arm which has some bruising, and large area of erythema around it, which is warm, but no streaking to axilla.  Neurological:      Mental Status: He is alert and oriented to person, place, and time.     Gait: Gait normal.  Psychiatric:        Mood and Affect: Mood normal.        Behavior: Behavior normal.        Thought Content: Thought content normal.        Judgment: Judgment normal.     UC Treatments / Results  Labs (all labs ordered are listed, but only abnormal results are displayed) Labs Reviewed - No data to display  EKG   Radiology No results found.  Procedures Procedures (including critical care time)  Medications Ordered in UC Medications - No data to display  Initial Impression / Assessment and Plan / UC Course  I have reviewed the triage vital signs and the nursing notes. Tick bite with cellulitis and bilateral cerumen impaction which resolved after lavage. I placed him on Doxy as noted.    Final Clinical Impressions(s) / UC Diagnoses   Final diagnoses:  None   Discharge Instructions   None    ED Prescriptions    None     PDMP not reviewed this encounter.   Shelby Mattocks, [place] 12/23/20 1947

## 2020-12-23 NOTE — ED Triage Notes (Addendum)
Tick bite to RT upper arm yesterday  that is swollen and red.  LT ear is stopped up as well.

## 2021-02-22 ENCOUNTER — Other Ambulatory Visit: Payer: Self-pay | Admitting: Family Medicine

## 2021-02-22 ENCOUNTER — Other Ambulatory Visit: Payer: Self-pay

## 2021-02-23 ENCOUNTER — Encounter: Payer: Self-pay | Admitting: Family Medicine

## 2021-02-23 ENCOUNTER — Other Ambulatory Visit: Payer: Self-pay

## 2021-02-23 ENCOUNTER — Ambulatory Visit: Payer: Medicare PPO | Admitting: Family Medicine

## 2021-02-23 VITALS — BP 122/67 | HR 61 | Temp 98.0°F | Resp 16 | Ht 68.0 in | Wt 137.0 lb

## 2021-02-23 DIAGNOSIS — E7800 Pure hypercholesterolemia, unspecified: Secondary | ICD-10-CM

## 2021-02-23 DIAGNOSIS — E538 Deficiency of other specified B group vitamins: Secondary | ICD-10-CM

## 2021-02-23 DIAGNOSIS — F17200 Nicotine dependence, unspecified, uncomplicated: Secondary | ICD-10-CM

## 2021-02-23 DIAGNOSIS — N183 Chronic kidney disease, stage 3 unspecified: Secondary | ICD-10-CM

## 2021-02-23 DIAGNOSIS — I10 Essential (primary) hypertension: Secondary | ICD-10-CM

## 2021-02-23 DIAGNOSIS — R7303 Prediabetes: Secondary | ICD-10-CM

## 2021-02-23 MED ORDER — CYANOCOBALAMIN 1000 MCG/ML IJ SOLN
1000.0000 ug | Freq: Once | INTRAMUSCULAR | Status: AC
  Administered 2021-02-23: 1000 ug via INTRAMUSCULAR

## 2021-02-23 NOTE — Addendum Note (Signed)
Addended by: Deveron Furlong D on: 02/23/2021 03:57 PM   Modules accepted: Orders

## 2021-02-23 NOTE — Progress Notes (Signed)
OFFICE VISIT  02/23/2021  CC:  Chief Complaint  Patient presents with   Follow-up    RCI; pt is fasting   HPI:    Patient is a 80 y.o. Caucasian male who presents for 7 mo f/u HTN, HLD, CRI III, and prediabetes. A/P as of last visit: "1) Chronic fatigue; mild.  Deconditioned, ongoing tob abuse, lack of outside stimulation (he and family never leave home for anything AT ALL since pandemic started.  Hopefully he'll perk up some with their upcoming trip to the mountains! CBC today.   2) Tob dependence: encouraged to quit, says he has no plans to though.   3) HTN: stable. Cont hctz and felodipine. Lytes/cr today.   4) HLD: tolerating statin. Continue. FLP and hepatic panel today.   5) Prediabetes: diet is fair. He is not sedentary but no formal exercise. Fasting gluc +hba1c today.   6) Carotid artery dz: due for carotid doppler monitoring-->ordered and will be arranged at [facility] radiology.   7) Raynaud's phenomenon-->obs.   8)CRI III: avoids NSAIDs. He has never been good at hydrating. Lytes/cr today."  INTERIM HX: Feeling well. He has given up on quitting smoking.  HTN: no home bp monitoring.  Compliant with plendil 29m qd and 1/2 hctz 251mqd.  HLD: LDL was up to 135 last visit so we added zetia 1089md.  He did not return for f/u lipid and hepatic panel testing.  No probs with meds.  Prediab: he drinks sugar colas, many per day.  NO WATER!  ROS as above, plus--> no fevers, no CP, no SOB, no wheezing, no cough, no dizziness, no HAs, no rashes, no melena/hematochezia.  No polyuria or polydipsia.  No myalgias or arthralgias.  No focal weakness, paresthesias, or tremors.  No acute vision or hearing abnormalities.  No dysuria or unusual/new urinary urgency or frequency.  No recent changes in lower legs. No n/v/d or abd pain.  No palpitations.     Past Medical History:  Diagnosis Date   Adenomatous colon polyp    Dr. RehLaural Golden8 polyps on 1st endo, 3 on 2nd endo a year  later.  8 on colonoscopy 02/26/14--recall 3 yrs.  Multiple adenomatous polyps on 09/20/17 TCS--recall 1 yr.. 11/2018 +aden pol->recall 3 yrs.   Aortic insufficiency    echo 03/25/12-EF>55%, mild-mod Aortic regurg, sclerotic aortic valve,    BPH (benign prostatic hyperplasia)    Dr. JaiLum Babet is asymptomatic)   Carotid artery occlusion    carotid dopplers 02/06/12-patent left carotid endarterectomy; right internal carotid 40-59%.  Repeat dopplers 03/2017, 40-59% stenosed bilat int ca's. Rpt 2019 and 2020->1-39% ica sten. 08/2020 R 50-69%, L mild->rpt 1 yr.   Chronic renal insufficiency, stage II (mild)    Borderline stage II/III--CrCl @ 60.  No RAS on 2003 angiogram (done for + renal artery dopplers--deemed eventually to have been a false positive).  Dr. BerGwenlyn Found DDD (degenerative disc disease), lumbar 02/2013   with spondylosis and moderate spinal stenosis+ left L5 and S1 nerve root impingement   Ectatic abdominal aorta (HCC) 08/08/2016   2.8 cm at widest point: repeat aortic u/s 08/2021.   Essential tremor    Fatty liver 2006   Noted on abd u/s and renal u/s 2006/2007   History of hyperkalemia 02/2017   mild; recommended pt cut his ARB in half.   History of MI (myocardial infarction)    Large fixed inferior defect on myocardial perfusion imaging 03/2017.   Hyperlipidemia    Dr. HocPercival Spanisharted  statin 03/2017.     Hypertension    PAD (peripheral artery disease) ([place])    carotids: carotid endarterectomy 2010.  Dr. Percival Spanish started pt on statin for CV risk reduction 03/2017   Plantar wart of left foot 12/21/2012   Prediabetes 2019   A1c 5.9%   SOB (shortness of breath)    MET test 03/25/12-mildly abnormal   Solitary pulmonary nodule 03/14/2013   36m RUL 03/08/13--stable on repeat 07/2013, 03/2015, and 01/2016.  No additional imaging is required.   Tobacco dependence    ongoing as of 02/2016   Vitamin B12 deficiency without anemia 2016   Intrinsic factor NEG: home vit B12 injections started 11/17/14     Past Surgical History:  Procedure Laterality Date   AMPUTATION FINGER Left 05/16/2019   Procedure: Revision Amutation of Left  fourth finger;  Surgeon: GRoseanne Kaufman MD;  Location: [facility]  Service: Orthopedics;  Laterality: Left;   APPENDECTOMY  remote   Arm surgery  July 2010   Left arm cyst/Lipoma   CARDIOVASCULAR STRESS TEST  03/2017   Low risk study (likely artifact seen, but no reversible ischemia, EF 59%, no wall motion abnormality.   carotid dopplers  02/2015; 02/2016;12/2017;06/2019   2017:  R ICA 1-39%, L ICA 40-59%: no change from 2016. 03/2017 rpt 40-59% bilat ICA stenosis. 12/2017 1-39% bilat ICA stenosis.  06/2019->same. Rpt 18 mo.   CAROTID ENDARTERECTOMY  11/25/08   Left     ICA   COLONOSCOPY N/A 02/26/2014   Recall 3 yrs: (+ polypectomy--tubular adenoma) Procedure: COLONOSCOPY;  Surgeon: NRogene Houston MD;  [facility];  Service: Endoscopy;  Laterality: N/A;  830   COLONOSCOPY N/A 09/20/2017   Multiple adenomatous polyps: recall 1 yr.  Procedure: COLONOSCOPY;  Surgeon: RRogene Houston MD;  [facility];  Service: Endoscopy;  Laterality: N/A;  730   COLONOSCOPY N/A 11/21/2018   Procedure: COLONOSCOPY;  Surgeon: RRogene Houston MD;  [facility];  Service: Endoscopy;  Laterality: N/A;  930   COLONOSCOPY W/ POLYPECTOMY  09/20/2017; 11/21/18   2019 Adenomatous polyp: recall 3-5 yrs (Dr. RLaural Golden.  11/2018-adenomatous polyp->recall 3 yrs   I & D EXTREMITY Left 05/16/2019   Procedure: IRRIGATION AND DEBRIDEMENT OF LEFT INDEX, MIDDLE,FOURTH FINGERS;  Surgeon: GRoseanne Kaufman MD;  [place] [facility]  Service: Orthopedics;  Laterality: Left;   NAILBED REPAIR Left 05/16/2019   Procedure: Reconstruction of bone and nail bed Left Index finger;  Surgeon: GRoseanne Kaufman MD;  [facility]  Service: Orthopedics;  Laterality: Left;   OPEN REDUCTION INTERNAL FIXATION (ORIF) HAND Left 05/16/2019   Procedure: Reconstruction and Open Reduction and Internal  fixation and Rotation flap Left Middle finger;  Surgeon: GRoseanne Kaufman MD;  Location[facility]  Service: Orthopedics;  Laterality: Left;   POLYPECTOMY  09/20/2017   Procedure: POLYPECTOMY;  Surgeon: RRogene Houston MD;  [facility];  Service: Endoscopy;;  Ascending colon, Transverse colon, Hepatic flexure, Cecum   POLYPECTOMY  11/21/2018   Procedure: POLYPECTOMY;  Surgeon: RRogene Houston MD;  [facility];  Service: Endoscopy;;   TONSILLECTOMY  remote   TONSILLECTOMY     TRANSTHORACIC ECHOCARDIOGRAM  03/2012   2013 --EF 55%, normal LV syst fxn, impaired diast relaxation, mild/mod aortic regurg.   UMBILICAL HERNIA REPAIR      Outpatient Medications Prior to Visit  Medication Sig Dispense Refill   aspirin EC 81 MG tablet Take 1 tablet (81 mg total) by mouth  at bedtime.     atorvastatin (LIPITOR) 80 MG tablet TAKE 1 TABLET(80 MG) BY MOUTH DAILY 30 tablet 0   cyanocobalamin (,VITAMIN B-12,) 1000 MCG/ML injection Inject 1,000 mcg into the muscle every 30 (thirty) days.      ezetimibe (ZETIA) 10 MG tablet Take 1 tablet (10 mg total) by mouth daily. 30 tablet 2   felodipine (PLENDIL) 10 MG 24 hr tablet TAKE 1 TABLET(10 MG) BY MOUTH DAILY 30 tablet 5   hydrochlorothiazide (HYDRODIURIL) 25 MG tablet 1/2 tab po qd 45 tablet 3   acetaminophen (TYLENOL) 500 MG tablet Take 1,000 mg by mouth every 6 (six) hours as needed for moderate pain.  (Patient not taking: No sig reported)     doxycycline (VIBRAMYCIN) 100 MG capsule Take 1 capsule (100 mg total) by mouth 2 (two) times daily. (Patient not taking: Reported on 02/23/2021) 20 capsule 0   sodium polystyrene (KAYEXALATE) powder 15 grams po once daily (Patient not taking: Reported on 02/23/2021) 454 g 0   No facility-administered medications prior to visit.    Allergies  Allergen Reactions   Metoprolol Other (See Comments)    Fatigue    Other    Clonidine Derivatives Rash    ROS As per HPI  PE: Vitals with BMI  02/23/2021 12/23/2020 12/23/2020  Height _0  - -  Weight 137 lbs - -  BMI 18.84 - -  Systolic 166 063 016  Diastolic 67 86 81  Pulse 61 56 109   Gen: Alert, well appearing.  Patient is oriented to person, place, time, and situation. AFFECT: pleasant, lucid thought and speech. CV: RRR, soft systolic murmur, no diastolic murmur, no r/g.   LUNGS: CTA bilat, nonlabored resps, good aeration in all lung fields. EXT: no clubbing or cyanosis.  no edema.    LABS:  Lab Results  Component Value Date   TSH 3.96 12/10/2017   Lab Results  Component Value Date   WBC 7.1 08/04/2020   HGB 15.1 08/04/2020   HCT 44.8 08/04/2020   MCV 88.6 08/04/2020   PLT 219.0 08/04/2020   Lab Results  Component Value Date   CREATININE 1.17 09/08/2020   BUN 15 09/08/2020   NA 140 09/08/2020   K 4.4 09/08/2020   CL 104 09/08/2020   CO2 30 09/08/2020   Lab Results  Component Value Date   ALT 6 09/08/2020   AST 9 09/08/2020   ALKPHOS 81 09/08/2020   BILITOT 0.6 09/08/2020   Lab Results  Component Value Date   CHOL 203 (H) 09/08/2020   Lab Results  Component Value Date   HDL 43.40 09/08/2020   Lab Results  Component Value Date   LDLCALC 135 (H) 09/08/2020   Lab Results  Component Value Date   TRIG 125.0 09/08/2020   Lab Results  Component Value Date   CHOLHDL 5 09/08/2020   Lab Results  Component Value Date   PSA 0.85 12/23/2012   Lab Results  Component Value Date   HGBA1C 6.0 08/04/2020   IMPRESSION AND PLAN:  1) HTN: stable. Cont plendil 10 qd and 12.5 hctz qd. Lytes/cr today.  2) HLD: tolerating high dose statin + zetia. FLP and hepatic panel today. LDL goal around 100.  3) CRI III: avoids NSAIDs but DOES NOT hydrate well. Lytes/cr today.  4) Prediabetes: eats diet high in simple carbs. Hba1c and fasting glucose today.  5) Tobacco dependence: encouraged cessation but he states he has given up trying.  An After Visit Summary was  printed and given to the  patient.  FOLLOW UP: Return in about 6 months (around 08/25/2021) for routine chronic illness f/u.  Signed:  Crissie Sickles, MD           02/23/2021

## 2021-02-24 LAB — COMPREHENSIVE METABOLIC PANEL
ALT: 8 U/L (ref 0–53)
AST: 12 U/L (ref 0–37)
Albumin: 4.2 g/dL (ref 3.5–5.2)
Alkaline Phosphatase: 84 U/L (ref 39–117)
BUN: 13 mg/dL (ref 6–23)
CO2: 29 mEq/L (ref 19–32)
Calcium: 9.8 mg/dL (ref 8.4–10.5)
Chloride: 102 mEq/L (ref 96–112)
Creatinine, Ser: 1.41 mg/dL (ref 0.40–1.50)
GFR: 47.94 mL/min — ABNORMAL LOW (ref >60.00)
Glucose, Bld: 95 mg/dL (ref 70–99)
Potassium: 4.6 mEq/L (ref 3.5–5.1)
Sodium: 139 mEq/L (ref 135–145)
Total Bilirubin: 0.8 mg/dL (ref 0.2–1.2)
Total Protein: 6.7 g/dL (ref 6.0–8.3)

## 2021-02-24 LAB — LIPID PANEL
Cholesterol: 143 mg/dL (ref 0–200)
HDL: 47.6 mg/dL (ref >39.00)
LDL Cholesterol: 76 mg/dL (ref 0–99)
NonHDL: 95.75
Total CHOL/HDL Ratio: 3
Triglycerides: 100 mg/dL (ref 0.0–149.0)
VLDL: 20 mg/dL (ref 0.0–40.0)

## 2021-02-24 LAB — HEMOGLOBIN A1C: Hgb A1c MFr Bld: 6.2 % (ref 4.6–6.5)

## 2021-03-22 ENCOUNTER — Other Ambulatory Visit: Payer: Self-pay | Admitting: Family Medicine

## 2021-03-22 ENCOUNTER — Other Ambulatory Visit: Payer: Self-pay

## 2021-04-25 ENCOUNTER — Other Ambulatory Visit: Payer: Self-pay | Admitting: Family Medicine

## 2021-06-07 ENCOUNTER — Telehealth: Payer: Self-pay

## 2021-06-07 NOTE — Telephone Encounter (Signed)
Pt and wife are coming on Friday for flu shots and b12. They are not due for f/u until dec. Last b12 check was 03/08/20. Please advise if needing labs for b12 before getting shot.

## 2021-06-10 ENCOUNTER — Other Ambulatory Visit: Payer: Self-pay

## 2021-06-10 ENCOUNTER — Ambulatory Visit (INDEPENDENT_AMBULATORY_CARE_PROVIDER_SITE_OTHER): Payer: Medicare PPO

## 2021-06-10 ENCOUNTER — Encounter: Payer: Self-pay | Admitting: Family Medicine

## 2021-06-10 DIAGNOSIS — Z23 Encounter for immunization: Secondary | ICD-10-CM | POA: Diagnosis not present

## 2021-06-10 MED ORDER — CYANOCOBALAMIN 1000 MCG/ML IJ SOLN
1000.0000 ug | Freq: Once | INTRAMUSCULAR | Status: AC
  Administered 2021-06-10: 1000 ug via INTRAMUSCULAR

## 2021-08-31 ENCOUNTER — Ambulatory Visit (INDEPENDENT_AMBULATORY_CARE_PROVIDER_SITE_OTHER): Payer: Medicare PPO

## 2021-08-31 ENCOUNTER — Other Ambulatory Visit: Payer: Self-pay

## 2021-08-31 DIAGNOSIS — E538 Deficiency of other specified B group vitamins: Secondary | ICD-10-CM

## 2021-08-31 MED ORDER — CYANOCOBALAMIN 1000 MCG/ML IJ SOLN
1000.0000 ug | Freq: Once | INTRAMUSCULAR | Status: AC
  Administered 2021-08-31: 11:00:00 1000 ug via INTRAMUSCULAR

## 2021-08-31 NOTE — Progress Notes (Signed)
Jeffrey [name] is a 80 y.o. male presents to the office today for monthly Vitamn B12 injections, per physician's orders.   Vitamin B12 1076mcg IM was administered right deltoid today. Patient tolerated injection.   Please sign off in PCP absence

## 2021-11-02 ENCOUNTER — Other Ambulatory Visit: Payer: Self-pay

## 2021-11-02 ENCOUNTER — Ambulatory Visit (INDEPENDENT_AMBULATORY_CARE_PROVIDER_SITE_OTHER): Payer: Medicare PPO

## 2021-11-02 DIAGNOSIS — Z0000 Encounter for general adult medical examination without abnormal findings: Secondary | ICD-10-CM | POA: Diagnosis not present

## 2021-11-02 NOTE — Progress Notes (Signed)
Virtual Visit via Telephone Note  I connected with  Jeffrey [name] on 11/02/21 at  8:45 AM EST by telephone and verified that I am speaking with the correct person using two identifiers.  Medicare Annual Wellness visit completed telephonically due to Covid-19 pandemic.   Persons participating in this call: This Health Coach and this patient.   Location: Patient: Home Provider: Office   I discussed the limitations, risks, security and privacy concerns of performing an evaluation and management service by telephone and the availability of in person appointments. The patient expressed understanding and agreed to proceed.  Unable to perform video visit due to video visit attempted and failed and/or patient does not have video capability.   Some vital signs may be absent or patient reported.   Willette Brace, LPN   Subjective:   PETAR MUCCI is a 81 y.o. male who presents for Medicare Annual/Subsequent preventive examination.  Review of Systems     Cardiac Risk Factors include: advanced age (>83mn, >>64women);hypertension;dyslipidemia;male gender;smoking/ tobacco exposure     Objective:    There were no vitals filed for this visit. There is no height or weight on file to calculate BMI.  Advanced Directives 11/02/2021 10/27/2020 05/16/2019 11/21/2018 11/21/2018 03/25/2018 12/21/2017  Does Patient Have a Medical Advance Directive? No No No No No No No  Type of Advance Directive - - - - - - -  Copy of Healthcare Power of Attorney in Chart? - - - - - - -  Would patient like information on creating a medical advance directive? No - Patient declined No - Patient declined No - Guardian declined No - Patient declined No - Patient declined Yes (MAU/Ambulatory/Procedural Areas - Information given) Yes (MAU/Ambulatory/Procedural Areas - Information given)  Pre-existing out of facility DNR order (yellow form or pink MOST form) - - - - - - -    Current Medications (verified) Outpatient Encounter  Medications as of 11/02/2021  Medication Sig   aspirin EC 81 MG tablet Take 1 tablet (81 mg total) by mouth at bedtime.   atorvastatin (LIPITOR) 80 MG tablet TAKE 1 TABLET(80 MG) BY MOUTH DAILY   cyanocobalamin (,VITAMIN B-12,) 1000 MCG/ML injection Inject 1,000 mcg into the muscle every 30 (thirty) days.    ezetimibe (ZETIA) 10 MG tablet TAKE 1 TABLET(10 MG) BY MOUTH DAILY   felodipine (PLENDIL) 10 MG 24 hr tablet TAKE 1 TABLET(10 MG) BY MOUTH DAILY   hydrochlorothiazide (HYDRODIURIL) 25 MG tablet 1/2 tab po qd   [DISCONTINUED] acetaminophen (TYLENOL) 500 MG tablet Take 1,000 mg by mouth every 6 (six) hours as needed for moderate pain.  (Patient not taking: No sig reported)   No facility-administered encounter medications on file as of 11/02/2021.    Allergies (verified) Metoprolol, Other, and Clonidine derivatives   History: Past Medical History:  Diagnosis Date   Adenomatous colon polyp    Dr. RLaural Golden(28 polyps on 1st endo, 3 on 2nd endo a year later.  8 on colonoscopy 02/26/14--recall 3 yrs.  Multiple adenomatous polyps on 09/20/17 TCS--recall 1 yr.. 11/2018 +aden pol->recall 3 yrs.   Aortic insufficiency    echo 03/25/12-EF>55%, mild-mod Aortic regurg, sclerotic aortic valve,    BPH (benign prostatic hyperplasia)    Dr. JLum Babe(pt is asymptomatic)   Carotid artery occlusion    carotid dopplers 02/06/12-patent left carotid endarterectomy; right internal carotid 40-59%.  Repeat dopplers 03/2017, 40-59% stenosed bilat int ca's. Rpt 2019 and 2020->1-39% ica sten. 08/2020 R 50-69%, L mild->rpt 1 yr.  Chronic renal insufficiency, stage II (mild)    Borderline stage II/III--CrCl @ 60.  No RAS on 2003 angiogram (done for + renal artery dopplers--deemed eventually to have been a false positive).  Dr. Gwenlyn Found.   DDD (degenerative disc disease), lumbar 02/2013   with spondylosis and moderate spinal stenosis+ left L5 and S1 nerve root impingement   Ectatic abdominal aorta (HCC) 08/08/2016   2.8 cm at  widest point: repeat aortic u/s 08/2021.   Essential tremor    Fatty liver 2006   Noted on abd u/s and renal u/s 2006/2007   History of hyperkalemia 02/2017   mild; recommended pt cut his ARB in half.   History of MI (myocardial infarction)    Large fixed inferior defect on myocardial perfusion imaging 03/2017.   Hyperlipidemia    Dr. Percival Spanish started statin 03/2017.     Hypertension    PAD (peripheral artery disease) ([place])    carotids: carotid endarterectomy 2010.  Dr. Percival Spanish started pt on statin for CV risk reduction 03/2017   Plantar wart of left foot 12/21/2012   Prediabetes 2019   A1c 5.9%   SOB (shortness of breath)    MET test 03/25/12-mildly abnormal   Solitary pulmonary nodule 03/14/2013   23m RUL 03/08/13--stable on repeat 07/2013, 03/2015, and 01/2016.  No additional imaging is required.   Tobacco dependence    ongoing as of 02/2016   Vitamin B12 deficiency without anemia 2016   Intrinsic factor NEG: home vit B12 injections started 11/17/14   Past Surgical History:  Procedure Laterality Date   AMPUTATION FINGER Left 05/16/2019   Procedure: Revision Amutation of Left  fourth finger;  Surgeon: GRoseanne Kaufman MD;  [place] [facility]  Service: Orthopedics;  Laterality: Left;   APPENDECTOMY  remote   Arm surgery  July 2010   Left arm cyst/Lipoma   CARDIOVASCULAR STRESS TEST  03/2017   Low risk study (likely artifact seen, but no reversible ischemia, EF 59%, no wall motion abnormality.   carotid dopplers  02/2015; 02/2016;12/2017;06/2019   2017:  R ICA 1-39%, L ICA 40-59%: no change from 2016. 03/2017 rpt 40-59% bilat ICA stenosis. 12/2017 1-39% bilat ICA stenosis.  06/2019->same. Rpt 18 mo.   CAROTID ENDARTERECTOMY  11/25/08   Left     ICA   COLONOSCOPY N/A 02/26/2014   Recall 3 yrs: (+ polypectomy--tubular adenoma) Procedure: COLONOSCOPY;  Surgeon: NRogene Houston MD;  [facility];  Service: Endoscopy;  Laterality: N/A;  830   COLONOSCOPY N/A 09/20/2017   Multiple  adenomatous polyps: recall 1 yr.  Procedure: COLONOSCOPY;  Surgeon: RRogene Houston MD;  [facility];  Service: Endoscopy;  Laterality: N/A;  730   COLONOSCOPY N/A 11/21/2018   Procedure: COLONOSCOPY;  Surgeon: RRogene Houston MD;  [facility];  Service: Endoscopy;  Laterality: N/A;  930   COLONOSCOPY W/ POLYPECTOMY  09/20/2017; 11/21/18   2019 Adenomatous polyp: recall 3-5 yrs (Dr. RLaural Golden.  11/2018-adenomatous polyp->recall 3 yrs   I & D EXTREMITY Left 05/16/2019   Procedure: IRRIGATION AND DEBRIDEMENT OF LEFT INDEX, MIDDLE,FOURTH FINGERS;  Surgeon: GRoseanne Kaufman MD;  [place] [facility]  Service: Orthopedics;  Laterality: Left;   NAILBED REPAIR Left 05/16/2019   Procedure: Reconstruction of bone and nail bed Left Index finger;  Surgeon: GRoseanne Kaufman MD;  Location[place] [facility]  Service: Orthopedics;  Laterality: Left;   OPEN REDUCTION INTERNAL FIXATION (ORIF) HAND Left 05/16/2019   Procedure: Reconstruction and Open Reduction and Internal fixation and  Rotation flap Left Middle finger;  Surgeon: Roseanne Kaufman, MD;  Location: [facility];  Service: Orthopedics;  Laterality: Left;   POLYPECTOMY  09/20/2017   Procedure: POLYPECTOMY;  Surgeon: Rogene Houston, MD;  [facility];  Service: Endoscopy;;  Ascending colon, Transverse colon, Hepatic flexure, Cecum   POLYPECTOMY  11/21/2018   Procedure: POLYPECTOMY;  Surgeon: Rogene Houston, MD;  [facility];  Service: Endoscopy;;   TONSILLECTOMY  remote   TONSILLECTOMY     TRANSTHORACIC ECHOCARDIOGRAM  03/2012   2013 --EF 55%, normal LV syst fxn, impaired diast relaxation, mild/mod aortic regurg.   UMBILICAL HERNIA REPAIR     Family History  Problem Relation Age of Onset   Coronary artery disease Mother    Stroke Mother    Diabetes Mother    Hypertension Mother    Parkinson's disease Mother    Stroke Sister    Cancer Brother    Heart disease Brother    Colon cancer Neg Hx    Social History    Socioeconomic History   Marital status: Married    Spouse name: Not on file   Number of children: Not on file   Years of education: Not on file   Highest education level: Not on file  Occupational History   Not on file  Tobacco Use   Smoking status: Every Day    Packs/day: 1.00    Years: 60.00    Pack years: 60.00    Types: Cigarettes   Smokeless tobacco: Never  Vaping Use   Vaping Use: Never used  Substance and Sexual Activity   Alcohol use: No    Alcohol/week: 0.0 standard drinks   Drug use: No   Sexual activity: Not on file  Other Topics Concern   Not on file  Social History Narrative   Married, 2 children.   Orig from [place].   Retired from [employer].   Current smoker; 1 ppd (x 50 yrs).  No hx alc, no drugs.   Active person, no formal exercise.   Social Determinants of Health   Financial Resource Strain: Low Risk    Difficulty of Paying Living Expenses: Not hard at all  Food Insecurity: No Food Insecurity   Worried About Charity fundraiser in the Last Year: Never true   [place] in the Last Year: Never true  Transportation Needs: No Transportation Needs   Lack of Transportation (Medical): No   Lack of Transportation (Non-Medical): No  Physical Activity: Inactive   Days of Exercise per Week: 0 days   Minutes of Exercise per Session: 0 min  Stress: No Stress Concern Present   Feeling of Stress : Not at all  Social Connections: Moderately Integrated   Frequency of Communication with Friends and Family: More than three times a week   Frequency of Social Gatherings with Friends and Family: More than three times a week   Attends Religious Services: 1 to 4 times per year   Active Member of [employer] or Organizations: No   Attends Music therapist: Never   Marital Status: Married    Tobacco Counseling Ready to quit: Not Answered Counseling given: Not Answered   Clinical Intake:  Pre-visit preparation completed: Yes         BMI - recorded: 20.84 Nutritional Status: BMI of 19-24  Normal Nutritional Risks: None Diabetes: No  How often do you need to have someone help you when you read instructions, pamphlets, or other  written materials from your doctor or pharmacy?: 1 - Never  Diabetic?No  Interpreter Needed?: No  Information entered by :: Charlott Rakes, LPN   Activities of Daily Living In your present state of health, do you have any difficulty performing the following activities: 11/02/2021  Hearing? Y  Comment wears hering aids  Vision? N  Difficulty concentrating or making decisions? N  Walking or climbing stairs? N  Dressing or bathing? N  Doing errands, shopping? N  Preparing Food and eating ? N  Using the Toilet? N  In the past six months, have you accidently leaked urine? N  Do you have problems with loss of bowel control? N  Managing your Medications? N  Managing your Finances? N  Housekeeping or managing your Housekeeping? N  Some recent data might be hidden    Patient Care Team: Tammi Sou, MD as PCP - General (Family Medicine) Rogene Houston, MD as Consulting Physician (Gastroenterology) Nickel, Sharmon Leyden, NP (Inactive) as Nurse Practitioner (Vascular Surgery) Minus Breeding, MD as Consulting Physician (Cardiology)  Indicate any recent Medical Services you may have received from other than [facility] providers in the past year (date may be approximate).     Assessment:   This is a routine wellness examination for [place].  Hearing/Vision screen Hearing Screening - Comments:: Pt wears hearing aids  Vision Screening - Comments:: Pt follows up with provider for annual eye exams   Dietary issues and exercise activities discussed: Current Exercise Habits: The patient does not participate in regular exercise at present   Goals Addressed             This Visit's Progress    Patient Stated       None at this time        Depression Screen PHQ 2/9 Scores 11/02/2021  10/27/2020 04/21/2020 09/26/2018 03/25/2018 09/14/2017 07/24/2016  PHQ - 2 Score 0 0 0 0 0 1 0    Fall Risk Fall Risk  11/02/2021 10/27/2020 04/21/2020 09/26/2018 03/25/2018  Falls in the past year? 0 0 0 0 No  Number falls in past yr: 0 0 0 - -  Injury with Fall? 0 0 0 - -  Risk for fall due to : Impaired vision - - - -  Follow up Falls prevention discussed Falls prevention discussed Falls evaluation completed - -    FALL RISK PREVENTION PERTAINING TO THE HOME:  Any stairs in or around the home? Yes  If so, are there any without handrails? No  Home free of loose throw rugs in walkways, pet beds, electrical cords, etc? Yes  Adequate lighting in your home to reduce risk of falls? Yes   ASSISTIVE DEVICES UTILIZED TO PREVENT FALLS:  Life alert? No  Use of a cane, walker or w/c? No  Grab bars in the bathroom? Yes  Shower chair or bench in shower? No  Elevated toilet seat or a handicapped toilet? No   TIMED UP AND GO:  Was the test performed? No .   Cognitive Function: MMSE - Mini Mental State Exam 03/25/2018  Orientation to time 5  Orientation to Place 5  Registration 3  Attention/ Calculation 5  Recall 3  Language- name 2 objects 2  Language- repeat 1  Language- follow 3 step command 3  Language- read & follow direction 1  Write a sentence 1  Copy design 1  Total score 30        Immunizations Immunization History  Administered Date(s) Administered   Fluad Quad(high  Dose 65+) 07/01/2019, 05/28/2020, 06/10/2021   Influenza Split 06/11/2012   Influenza, High Dose Seasonal PF 06/21/2016, 06/15/2017, 06/03/2018   Influenza,inj,Quad PF,6+ Mos 05/23/2013, 07/08/2014, 06/17/2015   PFIZER(Purple Top)SARS-COV-2 Vaccination 12/18/2019, 01/09/2020   Pneumococcal Conjugate-13 01/04/2016   Pneumococcal Polysaccharide-23 09/12/2011   Tdap 11/22/2017, 05/16/2019   Zoster Recombinat (Shingrix) 11/22/2017, 02/12/2018    TDAP status: Up to date  Flu Vaccine status: Up to  date  Pneumococcal vaccine status: Up to date  Covid-19 vaccine status: Completed vaccines  Qualifies for Shingles Vaccine? Yes   Zostavax completed Yes   Shingrix Completed?: Yes  Screening Tests Health Maintenance  Topic Date Due   Hepatitis C Screening  Never done   COVID-19 Vaccine (3 - Booster for Pfizer series) 03/05/2020   COLONOSCOPY (Pts 45-15yr Insurance coverage will need to be confirmed)  11/21/2023   TETANUS/TDAP  05/15/2029   Pneumonia Vaccine 81 Years old  Completed   INFLUENZA VACCINE  Completed   Zoster Vaccines- Shingrix  Completed   HPV VACCINES  Aged Out    Health Maintenance  Health Maintenance Due  Topic Date Due   Hepatitis C Screening  Never done   COVID-19 Vaccine (3 - Booster for [facility]) 03/05/2020    Colorectal cancer screening: Type of screening: Colonoscopy. Completed 11/21/18. Repeat every 5 years   Additional Screening:  Hepatitis C Screening: does qualify  Vision Screening: Recommended annual ophthalmology exams for early detection of glaucoma and other disorders of the eye. Is the patient up to date with their annual eye exam?  Yes  Who is the provider or what is the name of the office in which the patient attends annual eye exams? Unsure of providers name  If pt is not established with a provider, would they like to be referred to a provider to establish care? No .   Dental Screening: Recommended annual dental exams for proper oral hygiene  Community Resource Referral / Chronic Care Management: CRR required this visit?  No   CCM required this visit?  No      Plan:     I have personally reviewed and noted the following in the patients chart:   Medical and social history Use of alcohol, tobacco or illicit drugs  Current medications and supplements including opioid prescriptions. Patient is not currently taking opioid prescriptions. Functional ability and status Nutritional status Physical activity Advanced  directives List of other physicians Hospitalizations, surgeries, and ER visits in previous 12 months Vitals Screenings to include cognitive, depression, and falls Referrals and appointments  In addition, I have reviewed and discussed with patient certain preventive protocols, quality metrics, and best practice recommendations. A written personalized care plan for preventive services as well as general preventive health recommendations were provided to patient.     TWillette Brace LPN   26/28/3662  Nurse Notes: pt wife stated he has been shaking more in his hands. He is  holding his other hand to calm the shaking down. Pt wife stated she will call for appt for evaluation

## 2021-11-02 NOTE — Patient Instructions (Signed)
Jeffrey [name] , Thank you for taking time to come for your Medicare Wellness Visit. I appreciate your ongoing commitment to your health goals. Please review the following plan we discussed and let me know if I can assist you in the future.   Screening recommendations/referrals: Colonoscopy: Done 11/21/18 repeat every 5 years  Recommended yearly ophthalmology/optometry visit for glaucoma screening and checkup Recommended yearly dental visit for hygiene and checkup  Vaccinations: Influenza vaccine: Done 06/10/21 repeat every year  Pneumococcal vaccine: Up to date Tdap vaccine: Done 05/16/19 repeat every 10 years  Shingles vaccine: Completed 3/14, 02/12/18   Covid-19: Completed 4/8, 01/09/20  Advanced directives: Advance directive discussed with you today. Even though you declined this today please call our office should you change your mind and we can give you the proper paperwork for you to fill out.  Conditions/risks identified: None at this time  Next appointment: Follow up in one year for your annual wellness visit.   Preventive Care 26 Years and Older, Male Preventive care refers to lifestyle choices and visits with your health care provider that can promote health and wellness. What does preventive care include? A yearly physical exam. This is also called an annual well check. Dental exams once or twice a year. Routine eye exams. Ask your health care provider how often you should have your eyes checked. Personal lifestyle choices, including: Daily care of your teeth and gums. Regular physical activity. Eating a healthy diet. Avoiding tobacco and drug use. Limiting alcohol use. Practicing safe sex. Taking low doses of aspirin every day. Taking vitamin and mineral supplements as recommended by your health care provider. What happens during an annual well check? The services and screenings done by your health care provider during your annual well check will depend on your age, overall  health, lifestyle risk factors, and family history of disease. Counseling  Your health care provider may ask you questions about your: Alcohol use. Tobacco use. Drug use. Emotional well-being. Home and relationship well-being. Sexual activity. Eating habits. History of falls. Memory and ability to understand (cognition). Work and work Statistician. Screening  You may have the following tests or measurements: Height, weight, and BMI. Blood pressure. Lipid and cholesterol levels. These may be checked every 5 years, or more frequently if you are over 42 years old. Skin check. Lung cancer screening. You may have this screening every year starting at age 77 if you have a 30-pack-year history of smoking and currently smoke or have quit within the past 15 years. Fecal occult blood test (FOBT) of the stool. You may have this test every year starting at age 74. Flexible sigmoidoscopy or colonoscopy. You may have a sigmoidoscopy every 5 years or a colonoscopy every 10 years starting at age 70. Prostate cancer screening. Recommendations will vary depending on your family history and other risks. Hepatitis C blood test. Hepatitis B blood test. Sexually transmitted disease (STD) testing. Diabetes screening. This is done by checking your blood sugar (glucose) after you have not eaten for a while (fasting). You may have this done every 1-3 years. Abdominal aortic aneurysm (AAA) screening. You may need this if you are a current or former smoker. Osteoporosis. You may be screened starting at age 81 if you are at high risk. Talk with your health care provider about your test results, treatment options, and if necessary, the need for more tests. Vaccines  Your health care provider may recommend certain vaccines, such as: Influenza vaccine. This is recommended every year. Tetanus, diphtheria,  and acellular pertussis (Tdap, Td) vaccine. You may need a Td booster every 10 years. Zoster vaccine. You may  need this after age 18. Pneumococcal 13-valent conjugate (PCV13) vaccine. One dose is recommended after age 24. Pneumococcal polysaccharide (PPSV23) vaccine. One dose is recommended after age 88. Talk to your health care provider about which screenings and vaccines you need and how often you need them. This information is not intended to replace advice given to you by your health care provider. Make sure you discuss any questions you have with your health care provider. Document Released: 09/24/2015 Document Revised: 05/17/2016 Document Reviewed: 06/29/2015 Elsevier Interactive Patient Education  2017 [employer] Prevention in the Home Falls can cause injuries. They can happen to people of all ages. There are many things you can do to make your home safe and to help prevent falls. What can I do on the outside of my home? Regularly fix the edges of walkways and driveways and fix any cracks. Remove anything that might make you trip as you walk through a door, such as a raised step or threshold. Trim any bushes or trees on the path to your home. Use bright outdoor lighting. Clear any walking paths of anything that might make someone trip, such as rocks or tools. Regularly check to see if handrails are loose or broken. Make sure that both sides of any steps have handrails. Any raised decks and porches should have guardrails on the edges. Have any leaves, snow, or ice cleared regularly. Use sand or salt on walking paths during winter. Clean up any spills in your garage right away. This includes oil or grease spills. What can I do in the bathroom? Use night lights. Install grab bars by the toilet and in the tub and shower. Do not use towel bars as grab bars. Use non-skid mats or decals in the tub or shower. If you need to sit down in the shower, use a plastic, non-slip stool. Keep the floor dry. Clean up any water that spills on the floor as soon as it happens. Remove soap buildup in  the tub or shower regularly. Attach bath mats securely with double-sided non-slip rug tape. Do not have throw rugs and other things on the floor that can make you trip. What can I do in the bedroom? Use night lights. Make sure that you have a light by your bed that is easy to reach. Do not use any sheets or blankets that are too big for your bed. They should not hang down onto the floor. Have a firm chair that has side arms. You can use this for support while you get dressed. Do not have throw rugs and other things on the floor that can make you trip. What can I do in the kitchen? Clean up any spills right away. Avoid walking on wet floors. Keep items that you use a lot in easy-to-reach places. If you need to reach something above you, use a strong step stool that has a grab bar. Keep electrical cords out of the way. Do not use floor polish or wax that makes floors slippery. If you must use wax, use non-skid floor wax. Do not have throw rugs and other things on the floor that can make you trip. What can I do with my stairs? Do not leave any items on the stairs. Make sure that there are handrails on both sides of the stairs and use them. Fix handrails that are broken or loose. Make sure  that handrails are as long as the stairways. Check any carpeting to make sure that it is firmly attached to the stairs. Fix any carpet that is loose or worn. Avoid having throw rugs at the top or bottom of the stairs. If you do have throw rugs, attach them to the floor with carpet tape. Make sure that you have a light switch at the top of the stairs and the bottom of the stairs. If you do not have them, ask someone to add them for you. What else can I do to help prevent falls? Wear shoes that: Do not have high heels. Have rubber bottoms. Are comfortable and fit you well. Are closed at the toe. Do not wear sandals. If you use a stepladder: Make sure that it is fully opened. Do not climb a closed  stepladder. Make sure that both sides of the stepladder are locked into place. Ask someone to hold it for you, if possible. Clearly mark and make sure that you can see: Any grab bars or handrails. First and last steps. Where the edge of each step is. Use tools that help you move around (mobility aids) if they are needed. These include: Canes. Walkers. Scooters. Crutches. Turn on the lights when you go into a dark area. Replace any light bulbs as soon as they burn out. Set up your furniture so you have a clear path. Avoid moving your furniture around. If any of your floors are uneven, fix them. If there are any pets around you, be aware of where they are. Review your medicines with your doctor. Some medicines can make you feel dizzy. This can increase your chance of falling. Ask your doctor what other things that you can do to help prevent falls. This information is not intended to replace advice given to you by your health care provider. Make sure you discuss any questions you have with your health care provider. Document Released: 06/24/2009 Document Revised: 02/03/2016 Document Reviewed: 10/02/2014 Elsevier Interactive Patient Education  2017 [employer].

## 2021-11-10 ENCOUNTER — Other Ambulatory Visit: Payer: Self-pay

## 2021-11-10 ENCOUNTER — Ambulatory Visit: Payer: Medicare PPO

## 2021-11-10 ENCOUNTER — Encounter: Payer: Self-pay | Admitting: Family Medicine

## 2021-11-10 ENCOUNTER — Ambulatory Visit: Payer: Medicare PPO | Admitting: Family Medicine

## 2021-11-10 VITALS — BP 160/90 | HR 59 | Temp 98.3°F | Wt 147.8 lb

## 2021-11-10 DIAGNOSIS — E7800 Pure hypercholesterolemia, unspecified: Secondary | ICD-10-CM | POA: Diagnosis not present

## 2021-11-10 DIAGNOSIS — G4710 Hypersomnia, unspecified: Secondary | ICD-10-CM

## 2021-11-10 DIAGNOSIS — I10 Essential (primary) hypertension: Secondary | ICD-10-CM | POA: Diagnosis not present

## 2021-11-10 DIAGNOSIS — R6889 Other general symptoms and signs: Secondary | ICD-10-CM

## 2021-11-10 DIAGNOSIS — E538 Deficiency of other specified B group vitamins: Secondary | ICD-10-CM | POA: Diagnosis not present

## 2021-11-10 DIAGNOSIS — R5382 Chronic fatigue, unspecified: Secondary | ICD-10-CM | POA: Diagnosis not present

## 2021-11-10 DIAGNOSIS — R7303 Prediabetes: Secondary | ICD-10-CM

## 2021-11-10 DIAGNOSIS — R251 Tremor, unspecified: Secondary | ICD-10-CM | POA: Diagnosis not present

## 2021-11-10 LAB — COMPREHENSIVE METABOLIC PANEL
ALT: 11 U/L (ref 0–53)
AST: 14 U/L (ref 0–37)
Albumin: 4.2 g/dL (ref 3.5–5.2)
Alkaline Phosphatase: 93 U/L (ref 39–117)
BUN: 14 mg/dL (ref 6–23)
CO2: 34 mEq/L — ABNORMAL HIGH (ref 19–32)
Calcium: 9.6 mg/dL (ref 8.4–10.5)
Chloride: 100 mEq/L (ref 96–112)
Creatinine, Ser: 1.23 mg/dL (ref 0.40–1.50)
GFR: 56.2 mL/min — ABNORMAL LOW (ref >60.00)
Glucose, Bld: 94 mg/dL (ref 70–99)
Potassium: 3.9 mEq/L (ref 3.5–5.1)
Sodium: 140 mEq/L (ref 135–145)
Total Bilirubin: 0.7 mg/dL (ref 0.2–1.2)
Total Protein: 6.8 g/dL (ref 6.0–8.3)

## 2021-11-10 LAB — CBC WITH DIFFERENTIAL/PLATELET
Basophils Absolute: 0.1 10*3/uL (ref 0.0–0.1)
Basophils Relative: 1 % (ref 0.0–3.0)
Eosinophils Absolute: 0.2 10*3/uL (ref 0.0–0.7)
Eosinophils Relative: 3.7 % (ref 0.0–5.0)
HCT: 41.5 % (ref 39.0–52.0)
Hemoglobin: 14.2 g/dL (ref 13.0–17.0)
Lymphocytes Relative: 17.7 % (ref 12.0–46.0)
Lymphs Abs: 1.1 10*3/uL (ref 0.7–4.0)
MCHC: 34.2 g/dL (ref 30.0–36.0)
MCV: 86.3 fl (ref 78.0–100.0)
Monocytes Absolute: 0.5 10*3/uL (ref 0.1–1.0)
Monocytes Relative: 7.8 % (ref 3.0–12.0)
Neutro Abs: 4.4 10*3/uL (ref 1.4–7.7)
Neutrophils Relative %: 69.8 % (ref 43.0–77.0)
Platelets: 188 10*3/uL (ref 150.0–400.0)
RBC: 4.81 Mil/uL (ref 4.22–5.81)
RDW: 14.4 % (ref 11.5–15.5)
WBC: 6.3 10*3/uL (ref 4.0–10.5)

## 2021-11-10 LAB — LIPID PANEL
Cholesterol: 139 mg/dL (ref 0–200)
HDL: 45.6 mg/dL (ref >39.00)
LDL Cholesterol: 71 mg/dL (ref 0–99)
NonHDL: 93.13
Total CHOL/HDL Ratio: 3
Triglycerides: 109 mg/dL (ref 0.0–149.0)
VLDL: 21.8 mg/dL (ref 0.0–40.0)

## 2021-11-10 LAB — VITAMIN B12: Vitamin B-12: >1504 pg/mL — ABNORMAL HIGH (ref 211–911)

## 2021-11-10 LAB — TSH: TSH: 3.41 u[IU]/mL (ref 0.35–5.50)

## 2021-11-10 LAB — HEMOGLOBIN A1C: Hgb A1c MFr Bld: 6 % (ref 4.6–6.5)

## 2021-11-10 MED ORDER — CYANOCOBALAMIN 1000 MCG/ML IJ SOLN
1000.0000 ug | Freq: Once | INTRAMUSCULAR | Status: AC
  Administered 2021-11-10: 1000 ug via INTRAMUSCULAR

## 2021-11-10 MED ORDER — EZETIMIBE 10 MG PO TABS: ORAL_TABLET | ORAL | 1 refills | Status: DC

## 2021-11-10 MED ORDER — HYDROCHLOROTHIAZIDE 25 MG PO TABS: ORAL_TABLET | ORAL | 1 refills | Status: DC

## 2021-11-10 MED ORDER — OMEPRAZOLE 40 MG PO CPDR: 40.0000 mg | DELAYED_RELEASE_CAPSULE | Freq: Every day | ORAL | 3 refills | Status: DC

## 2021-11-10 MED ORDER — FELODIPINE ER 10 MG PO TB24: ORAL_TABLET | ORAL | 1 refills | Status: DC

## 2021-11-10 NOTE — Progress Notes (Signed)
OFFICE VISIT  11/10/2021  CC:  Chief Complaint  Patient presents with   Shaking    Hands and arms, both thumbs shake bad. Occurring over 3 months. Pt is fasting    Patient is a 81 y.o. male who presents for hands shaking. I last saw him June 2022 for f/u HTN, HLD, CRI III, and prediabetes. A/P as of that visit: "1) HTN: stable. Cont plendil 10 qd and 12.5 hctz qd. Lytes/cr today.   2) HLD: tolerating high dose statin + zetia. FLP and hepatic panel today. LDL goal around 100.   3) CRI III: avoids NSAIDs but DOES NOT hydrate well. Lytes/cr today.   4) Prediabetes: eats diet high in simple carbs. Hba1c and fasting glucose today.   5) Tobacco dependence: encouraged cessation but he states he has given up trying"  INTERIM HX: Bilateral hand tremor gradually getting worse.  Has trouble holding utensils still in order to eat. Chronic sleepiness lately, worn out more easily.  Says he never feels like he gets good rest. Says he is bored but denies depressed mood.  No anxiety. He continues to smoke cigarettes. Says he is taking all chronic medications. No home blood pressure monitoring lately.  Was normal consistently at home so he stopped checking it.  Denies any problem falling or feeling like he is unbalanced.  No dizziness, vertigo, hearing changes, or vision changes.  No focal weakness. No headaches or pain anywhere. He says his mother had Parkinson's disease.  ROS as above, plus--> no fevers, no CP, no SOB, no wheezing, no cough, no dizziness, no HAs, no rashes, no melena/hematochezia.  No polyuria or polydipsia.  No myalgias or arthralgias.   No dysuria or unusual/new urinary urgency or frequency.  No recent changes in lower legs. No n/v/d or abd pain.  No palpitations.   +GERD regularly.  Past Medical History:  Diagnosis Date   Adenomatous colon polyp    Dr. Laural Golden (28 polyps on 1st endo, 3 on 2nd endo a year later.  8 on colonoscopy 02/26/14--recall 3 yrs.  Multiple  adenomatous polyps on 09/20/17 TCS--recall 1 yr.. 11/2018 +aden pol->recall 3 yrs.   Aortic insufficiency    echo 03/25/12-EF>55%, mild-mod Aortic regurg, sclerotic aortic valve,    BPH (benign prostatic hyperplasia)    Dr. Lum Babe (pt is asymptomatic)   Carotid artery occlusion    carotid dopplers 02/06/12-patent left carotid endarterectomy; right internal carotid 40-59%.  Repeat dopplers 03/2017, 40-59% stenosed bilat int ca's. Rpt 2019 and 2020->1-39% ica sten. 08/2020 R 50-69%, L mild->rpt 1 yr.   Chronic renal insufficiency, stage II (mild)    Borderline stage II/III--CrCl @ 60.  No RAS on 2003 angiogram (done for + renal artery dopplers--deemed eventually to have been a false positive).  Dr. Gwenlyn Found.   DDD (degenerative disc disease), lumbar 02/2013   with spondylosis and moderate spinal stenosis+ left L5 and S1 nerve root impingement   Ectatic abdominal aorta (HCC) 08/08/2016   2.8 cm at widest point: repeat aortic u/s 08/2021.   Essential tremor    Fatty liver 2006   Noted on abd u/s and renal u/s 2006/2007   History of hyperkalemia 02/2017   mild; recommended pt cut his ARB in half.   History of MI (myocardial infarction)    Large fixed inferior defect on myocardial perfusion imaging 03/2017.   Hyperlipidemia    Dr. Percival Spanish started statin 03/2017.     Hypertension    PAD (peripheral artery disease) (HCC)    carotids: carotid  endarterectomy 2010.  Dr. Percival Spanish started pt on statin for CV risk reduction 03/2017   Plantar wart of left foot 12/21/2012   Prediabetes 2019   A1c 5.9%   SOB (shortness of breath)    MET test 03/25/12-mildly abnormal   Solitary pulmonary nodule 03/14/2013   81m RUL 03/08/13--stable on repeat 07/2013, 03/2015, and 01/2016.  No additional imaging is required.   Tobacco dependence    ongoing as of 02/2016   Vitamin B12 deficiency without anemia 2016   Intrinsic factor NEG: home vit B12 injections started 11/17/14    Past Surgical History:  Procedure Laterality Date    AMPUTATION FINGER Left 05/16/2019   Procedure: Revision Amutation of Left  fourth finger;  Surgeon: GRoseanne Kaufman MD;  [facility] [facility]  Service: Orthopedics;  Laterality: Left;   APPENDECTOMY  remote   Arm surgery  July 2010   Left arm cyst/Lipoma   CARDIOVASCULAR STRESS TEST  03/2017   Low risk study (likely artifact seen, but no reversible ischemia, EF 59%, no wall motion abnormality.   carotid dopplers  02/2015; 02/2016;12/2017;06/2019   2017:  R ICA 1-39%, L ICA 40-59%: no change from 2016. 03/2017 rpt 40-59% bilat ICA stenosis. 12/2017 1-39% bilat ICA stenosis.  06/2019->same. Rpt 18 mo.   CAROTID ENDARTERECTOMY  11/25/08   Left     ICA   COLONOSCOPY N/A 02/26/2014   Recall 3 yrs: (+ polypectomy--tubular adenoma) Procedure: COLONOSCOPY;  Surgeon: NRogene Houston MD;  [facility];  Service: Endoscopy;  Laterality: N/A;  830   COLONOSCOPY N/A 09/20/2017   Multiple adenomatous polyps: recall 1 yr.  Procedure: COLONOSCOPY;  Surgeon: RRogene Houston MD;  [facility];  Service: Endoscopy;  Laterality: N/A;  730   COLONOSCOPY N/A 11/21/2018   Procedure: COLONOSCOPY;  Surgeon: RRogene Houston MD;  [facility];  Service: Endoscopy;  Laterality: N/A;  930   COLONOSCOPY W/ POLYPECTOMY  09/20/2017; 11/21/18   2019 Adenomatous polyp: recall 3-5 yrs (Dr. RLaural Golden.  11/2018-adenomatous polyp->recall 3 yrs   I & D EXTREMITY Left 05/16/2019   Procedure: IRRIGATION AND DEBRIDEMENT OF LEFT INDEX, MIDDLE,FOURTH FINGERS;  Surgeon: GRoseanne Kaufman MD;  [place] [facility]  Service: Orthopedics;  Laterality: Left;   NAILBED REPAIR Left 05/16/2019   Procedure: Reconstruction of bone and nail bed Left Index finger;  Surgeon: GRoseanne Kaufman MD;  [facility]  Service: Orthopedics;  Laterality: Left;   OPEN REDUCTION INTERNAL FIXATION (ORIF) HAND Left 05/16/2019   Procedure: Reconstruction and Open Reduction and Internal fixation and Rotation flap Left Middle finger;  Surgeon:  GRoseanne Kaufman MD;  [facility]  Service: Orthopedics;  Laterality: Left;   POLYPECTOMY  09/20/2017   Procedure: POLYPECTOMY;  Surgeon: RRogene Houston MD;  [facility];  Service: Endoscopy;;  Ascending colon, Transverse colon, Hepatic flexure, Cecum   POLYPECTOMY  11/21/2018   Procedure: POLYPECTOMY;  Surgeon: RRogene Houston MD;  [facility];  Service: Endoscopy;;   TONSILLECTOMY  remote   TONSILLECTOMY     TRANSTHORACIC ECHOCARDIOGRAM  03/2012   2013 --EF 55%, normal LV syst fxn, impaired diast relaxation, mild/mod aortic regurg.   UMBILICAL HERNIA REPAIR      Outpatient Medications Prior to Visit  Medication Sig Dispense Refill   aspirin EC 81 MG tablet Take 1 tablet (81 mg total) by mouth at bedtime.     atorvastatin (LIPITOR) 80 MG tablet TAKE 1 TABLET(80 MG) BY MOUTH DAILY 90 tablet  1   cyanocobalamin (,VITAMIN B-12,) 1000 MCG/ML injection Inject 1,000 mcg into the muscle every 30 (thirty) days.      ezetimibe (ZETIA) 10 MG tablet TAKE 1 TABLET(10 MG) BY MOUTH DAILY 90 tablet 1   felodipine (PLENDIL) 10 MG 24 hr tablet TAKE 1 TABLET(10 MG) BY MOUTH DAILY 30 tablet 5   hydrochlorothiazide (HYDRODIURIL) 25 MG tablet 1/2 tab po qd 45 tablet 3   No facility-administered medications prior to visit.    Allergies  Allergen Reactions   Metoprolol Other (See Comments)    Fatigue    Other    Clonidine Derivatives Rash    ROS As per HPI  PE: Vitals with BMI 11/10/2021 02/23/2021 12/23/2020  Height - 5' 8"  -  Weight 147 lbs 13 oz 137 lbs -  BMI - 13.24 -  Systolic 401 027 253  Diastolic 90 67 86  Pulse 59 61 56     Physical Exam  Gen: Alert, well appearing.  Patient is oriented to person, place, time, and situation. AFFECT: pleasant, lucid thought and speech. GUY:QIHK: no injection, icteris, swelling, or exudate.  EOMI, PERRLA. Mouth: lips without lesion/swelling.  Oral mucosa pink and moist. Oropharynx without erythema, exudate, or  swelling.  CV: RRR, 7-4/2 systolic ejection murmur, no r/g.   LUNGS: CTA bilat, nonlabored resps, good aeration in all lung fields. ABD: soft, NT/ND EXT: no clubbing or cyanosis.  no edema.  Neuro: CN 2-12 intact bilaterally, strength 5/5 in proximal and distal upper extremities and lower extremities bilaterally.    Very mild bilat hand tremors when hands rested on lap, no change when arms outstretched.  No intention tremor.  No rigidity, no bradykinesia, no postural instability.  No ataxia.  Upper extremity and lower extremity DTRs symmetric.  No pronator drift.   LABS:  Last CBC Lab Results  Component Value Date   WBC 7.1 08/04/2020   HGB 15.1 08/04/2020   HCT 44.8 08/04/2020   MCV 88.6 08/04/2020   MCH 29.4 05/16/2019   RDW 14.7 08/04/2020   PLT 219.0 59/56/3875   Last metabolic panel Lab Results  Component Value Date   GLUCOSE 95 02/23/2021   NA 139 02/23/2021   K 4.6 02/23/2021   CL 102 02/23/2021   CO2 29 02/23/2021   BUN 13 02/23/2021   CREATININE 1.41 02/23/2021   GFRNONAA 51 (L) 05/16/2019   CALCIUM 9.8 02/23/2021   PROT 6.7 02/23/2021   ALBUMIN 4.2 02/23/2021   BILITOT 0.8 02/23/2021   ALKPHOS 84 02/23/2021   AST 12 02/23/2021   ALT 8 02/23/2021   ANIONGAP 10 05/16/2019   Last lipids Lab Results  Component Value Date   CHOL 143 02/23/2021   HDL 47.60 02/23/2021   LDLCALC 76 02/23/2021   TRIG 100.0 02/23/2021   CHOLHDL 3 02/23/2021   Last hemoglobin A1c Lab Results  Component Value Date   HGBA1C 6.2 02/23/2021   Lab Results  Component Value Date   VITAMINB12 370 03/08/2020   Lab Results  Component Value Date   TSH 3.96 12/10/2017   Lab Results  Component Value Date   ESRSEDRATE 22 11/12/2014   IMPRESSION AND PLAN:  #1 tremor, gradually worsening.  Suspect familial/essential tremor. Will ask neurology to see. Hesitate to start beta-blocker or primidone for this due to patient already having significant problems with fatigue.  #2  hypertension.  BP elevated here today but monitoring at home has always been normal.  No changes today--continue Plendil 10 mg a day and HCTZ  12.5 mg a day Electrolytes and creatinine today.  #3 chronic renal insufficiency stage III. Checking electrolytes and creatinine today  #4 hyperlipidemia.  Tolerating atorvastatin 80 mg a day and Zetia 10 mg a day. Fasting lipid panel and hepatic panel today.  #5 GERD: Start omeprazole 40 mg a day  #6 prediabetes. Check hemoglobin A1c and fasting glucose today.  #7 chronic fatigue/excessive daytime sleepiness. Denies snoring and any witnessed apneic periods.  Denies depression. Monitoring CBC and TSH today as well as B12 level.  8.  Vitamin B12 deficiency. He only comes in for his B12 injections a few times a year, most recent 08/2021. 1000 mcg IM today  An After Visit Summary was printed and given to the patient.  FOLLOW UP: No follow-ups on file.  Signed:  Crissie Sickles, MD           11/10/2021

## 2021-11-11 ENCOUNTER — Encounter: Payer: Self-pay | Admitting: Neurology

## 2021-11-17 NOTE — Progress Notes (Signed)
? ?Assessment/Plan:  ? ? Essential Tremr ?No evidence of Parkinsons Disease.  Reassurance provided. ?-Discussed with patient that medications for tremor are very limited in him.  Pt saw primary care is worried about a beta-blocker because of chronic issues with fatigue.  He is also bradycardic here in the office today.  Patient is on Plendil, which interacts with primidone.  The only other first-line drug is topiramate, and in order for that 1 to be considered first-line, has to be on fairly high dosages, which is generally not well-tolerated in this age group because of cognitive change.  Gabapentin could be an option as a second line medication.  After some discussion, the patient stated that he really does not want medication for tremor right now.  He just wanted to make sure he did not have Parkinson's disease.  Told patient that if he wanted medication in the future, his primary care could probably start with gabapentin and see how he did. ?Patient education was provided today. ?Patient to follow-up on an as-needed basis. ? ?Subjective:  ? ?Jeffrey [name] was seen today in the movement disorders clinic for neurologic consultation at the request of McGowen, Adrian Blackwater, MD.  The consultation is for the evaluation of bilateral upper extremity tremor, felt likely to be essential tremor.  This patient is accompanied in the office by his spouse who supplements the history. ? ?Tremor: Yes.    ? How long has it been going on? 6-8 months ago ? At rest or with activation?  activation ? When is it noted the most?  eating, using tools ? Fam hx of tremor?  Yes.  , mother with Parkinsons Disease  ? Located where?  Bilateral UE; he is R hand dominant ? Affected by caffeine:  No. ? Affected by alcohol:  doesn't drink ? Affected by stress:  No. ? Affected by fatigue:  No. ? Spills soup if on spoon:  Yes.   ? Spills glass of liquid if full:  may or may not ? ? ?Other Specific Symptoms:  ?Voice: no change ?Postural symptoms:   No. ? Falls?  No. ?Bradykinesia symptoms: shuffling gait ?Loss of smell:  No. ?Loss of taste:  No. ?Difficulty Swallowing:  No. ?N/V:  No. ?Lightheaded:  No. ? Syncope: No. ?Diplopia:  No. ?Dyskinesia:  No. ? ?Neuroimaging of the brain has not previously been performed (not since 2014) ? ? ?ALLERGIES:   ?Allergies  ?Allergen Reactions  ? Metoprolol Other (See Comments)  ?  Fatigue ?  ? Other   ? Clonidine Derivatives Rash  ? ? ?CURRENT MEDICATIONS:  ?Current Outpatient Medications  ?Medication Instructions  ? aspirin EC 81 mg, Oral, Daily at bedtime  ? atorvastatin (LIPITOR) 80 MG tablet TAKE 1 TABLET(80 MG) BY MOUTH DAILY  ? cyanocobalamin ((VITAMIN B-12)) 1,000 mcg, Intramuscular, Every 30 days  ? ezetimibe (ZETIA) 10 MG tablet TAKE 1 TABLET(10 MG) BY MOUTH DAILY  ? felodipine (PLENDIL) 10 MG 24 hr tablet TAKE 1 TABLET(10 MG) BY MOUTH DAILY  ? hydrochlorothiazide (HYDRODIURIL) 25 MG tablet 1/2 tab po qd  ? omeprazole (PRILOSEC) 40 mg, Oral, Daily  ? ? ?Objective:  ? ?PHYSICAL EXAMINATION:   ? ?VITALS:   ?Vitals:  ? 11/21/21 1001  ?BP: (!) 147/89  ?Pulse: (!) 58  ?SpO2: 100%  ?Weight: 141 lb 12.8 oz (64.3 kg)  ?Height: '5\' 8"'$  (1.727 m)  ? ? ?GEN:  The patient appears stated age and is in NAD. ?HEENT:  Normocephalic, atraumatic.  The mucous membranes  are moist. The superficial temporal arteries are without ropiness or tenderness. ?CV: Bradycardic.  Regular ?Lungs:  CTAB ?Neck/HEME:  There are no carotid bruits bilaterally. ? ?Neurological examination: ? ?Orientation: The patient is alert and oriented x3.  ?Cranial nerves: There is good facial symmetry.  Extraocular muscles are intact. The visual fields are full to confrontational testing. The speech is fluent and clear. Soft palate rises symmetrically and there is no tongue deviation. Hearing is decreased to conversational tone. ?Sensation: Sensation is intact to light touch throughout (facial, trunk, extremities). Vibration is intact at the bilateral big toe. There  is no extinction with double simultaneous stimulation.  ?Motor: Strength is 5/5 in the bilateral upper and lower extremities.   Shoulder shrug is equal and symmetric.  There is no pronator drift. ?Deep tendon reflexes: Deep tendon reflexes are 2-/4 at the bilateral biceps, triceps, brachioradialis, patella and achilles. Plantar responses are downgoing bilaterally. ? ?Movement examination: ?Tone: There is normal tone in the bilateral upper extremities.  The tone in the lower extremities is normal.  ?Abnormal movements: There is no rest tremor.  There is no postural tremor.  There is very little intention tremor.  He does have some mild difficulty with Archimedes spirals.  He does have difficulty pouring water from 1 glass to another, but does not spill it much. ?Coordination:  There is no decremation with RAM's, with any form of RAMS, including alternating supination and pronation of the forearm, hand opening and closing, finger taps, heel taps and toe taps. ?Gait and Station: The patient has no difficulty arising out of a deep-seated chair without the use of the hands. The patient's stride length is good.   ?I have reviewed and interpreted the following labs independently ?  Chemistry   ?   ?Component Value Date/Time  ? NA 140 11/10/2021 1005  ? K 3.9 11/10/2021 1005  ? CL 100 11/10/2021 1005  ? CO2 34 (H) 11/10/2021 1005  ? BUN 14 11/10/2021 1005  ? CREATININE 1.23 11/10/2021 1005  ? CREATININE 1.19 07/04/2013 1115  ?    ?Component Value Date/Time  ? CALCIUM 9.6 11/10/2021 1005  ? ALKPHOS 93 11/10/2021 1005  ? AST 14 11/10/2021 1005  ? ALT 11 11/10/2021 1005  ? BILITOT 0.7 11/10/2021 1005  ?  ? ? ?Lab Results  ?Component Value Date  ? TSH 3.41 11/10/2021  ? ?Lab Results  ?Component Value Date  ? WBC 6.3 11/10/2021  ? HGB 14.2 11/10/2021  ? HCT 41.5 11/10/2021  ? MCV 86.3 11/10/2021  ? PLT 188.0 11/10/2021  ? ? ? ? ?Total time spent on today's visit was 47 minutes, including both face-to-face time and  nonface-to-face time.  Time included that spent on review of records (prior notes available to me/labs/imaging if pertinent), discussing treatment and goals, answering patient's questions and coordinating care. ? ?Cc:  McGowen, Adrian Blackwater, MD ? ?

## 2021-11-21 ENCOUNTER — Ambulatory Visit: Payer: Medicare PPO | Admitting: Neurology

## 2021-11-21 ENCOUNTER — Encounter: Payer: Self-pay | Admitting: Neurology

## 2021-11-21 ENCOUNTER — Other Ambulatory Visit: Payer: Self-pay

## 2021-11-21 VITALS — BP 147/89 | HR 58 | Ht 68.0 in | Wt 141.8 lb

## 2021-11-21 DIAGNOSIS — R001 Bradycardia, unspecified: Secondary | ICD-10-CM

## 2021-11-21 DIAGNOSIS — G250 Essential tremor: Secondary | ICD-10-CM

## 2021-11-21 DIAGNOSIS — R5383 Other fatigue: Secondary | ICD-10-CM | POA: Diagnosis not present

## 2021-11-21 NOTE — Patient Instructions (Signed)
Good to see you today! ? ?We discussed that you have essential tremor and you decided to hold off on medications for now. ? ?Essential Tremor ?A tremor is trembling or shaking that a person cannot control. Most tremors affect the hands or arms. Tremors can also affect the head, vocal cords, legs, and other parts of the body. Essential tremor is a tremor without a known cause. Usually, it occurs while a person is trying to perform an action. It tends to get worse gradually as a person ages. ?What are the causes? ?The cause of this condition is not known. ?What increases the risk? ?You are more likely to develop this condition if: ?You have a family member with essential tremor. ?You are age 79 or older. ?You take certain medicines. ?What are the signs or symptoms? ?The main sign of a tremor is a rhythmic shaking of certain parts of your body that is uncontrolled and unintentional. You may: ?Have difficulty eating with a spoon or fork. ?Have difficulty writing. ?Nod your head up and down or side to side. ?Have a quivering voice. ?The shaking may: ?Get worse over time. ?Come and go. ?Be more noticeable on one side of your body. ?Get worse due to stress, fatigue, caffeine, and extreme heat or cold. ?How is this diagnosed? ?This condition may be diagnosed based on: ?Your symptoms and medical history. ?A physical exam. ?There is no single test to diagnose an essential tremor. However, your health care provider may order tests to rule out other causes of your condition. These may include: ?Blood and urine tests. ?Imaging studies of your brain, such as CT scan and MRI. ?A test that measures involuntary muscle movement (electromyogram). ?How is this treated? ?Treatment for essential tremor depends on the severity of the condition. ?Some tremors may go away without treatment. ?Mild tremors may not need treatment if they do not affect your day-to-day life. ?Severe tremors may need to be treated using one or more of the  following options: ?Medicines. ?Lifestyle changes. ?Occupational or physical therapy. ?Follow these instructions at home: ?Lifestyle ? ?Do not use any products that contain nicotine or tobacco, such as cigarettes and e-cigarettes. If you need help quitting, ask your health care provider. ?Limit your caffeine intake as told by your health care provider. ?Try to get 8 hours of sleep each night. ?Find ways to manage your stress that fits your lifestyle and personality. Consider trying meditation or yoga. ?Try to anticipate stressful situations and allow extra time to manage them. ?If you are struggling emotionally with the effects of your tremor, consider working with a mental health provider. ?General instructions ?Take over-the-counter and prescription medicines only as told by your health care provider. ?Avoid extreme heat and extreme cold. ?Keep all follow-up visits as told by your health care provider. This is important. Visits may include physical therapy visits. ?Contact a health care provider if: ?You experience any changes in the location or intensity of your tremors. ?You start having a tremor after starting a new medicine. ?You have tremor with other symptoms, such as: ?Numbness. ?Tingling. ?Pain. ?Weakness. ?Your tremor gets worse. ?Your tremor interferes with your daily life. ?You feel down, blue, or sad for at least 2 weeks in a row. ?Worrying about your tremor and what other people think about you interferes with your everyday life functions, including relationships, work, or school. ?Summary ?Essential tremor is a tremor without a known cause. Usually, it occurs when you are trying to perform an action. ?You are more likely  to develop this condition if you have a family member with essential tremor. ?The main sign of a tremor is a rhythmic shaking of certain parts of your body that is uncontrolled and unintentional. ?Treatment for essential tremor depends on the severity of the condition. ?This  information is not intended to replace advice given to you by your health care provider. Make sure you discuss any questions you have with your health care provider. ?Document Revised: 05/19/2020 Document Reviewed: 05/21/2020 ?Elsevier Patient Education ? [place]. ? ?

## 2022-02-23 ENCOUNTER — Ambulatory Visit: Payer: Medicare PPO | Admitting: Family Medicine

## 2022-02-23 ENCOUNTER — Encounter: Payer: Self-pay | Admitting: Family Medicine

## 2022-02-23 VITALS — BP 113/57 | HR 51 | Temp 98.5°F | Ht 68.0 in | Wt 138.8 lb

## 2022-02-23 DIAGNOSIS — R55 Syncope and collapse: Secondary | ICD-10-CM

## 2022-02-23 DIAGNOSIS — I951 Orthostatic hypotension: Secondary | ICD-10-CM

## 2022-02-23 DIAGNOSIS — I6523 Occlusion and stenosis of bilateral carotid arteries: Secondary | ICD-10-CM

## 2022-02-23 DIAGNOSIS — R0989 Other specified symptoms and signs involving the circulatory and respiratory systems: Secondary | ICD-10-CM | POA: Diagnosis not present

## 2022-02-23 NOTE — Progress Notes (Signed)
OFFICE VISIT  02/23/2022  CC:  Chief Complaint  Patient presents with   Fall    Pt fell yesterday; no bruises or scrapes. Minor headache but not enough to bother him.   CC: Patient is a 81 y.o. male who presents accompanied by his wife Izora Gala for syncope.  HPI: Night before last he got up in the middle the night (2 AM) and went to the bathroom.  He remembers going to the bathroom and walking down the hall to his kitchen and starting to make himself a cup of coffee.  Apparently, he will commonly do this in the middle the night.  The next thing he knew he woke up on the floor.  Wife heard a loud noise and went to the kitchen and found him on the floor a bit dazed.  He was alert and quickly oriented and speaking clearly.  No loss of bowel or bladder.  No tremulous or convulsive activity.  Blood pressure check not long after this was 106/52, no heart rate recalled by patient or wife. Prior to this he had not been having any physical symptoms at all.  Including, no dizziness, no headaches, no nausea, no vision abnormalities, no focal weakness, no lower extremity swelling, no palpitations, no chest pain, no melena, no hematochezia.  He drinks a lot of coffee throughout his day but not much other liquids. No recent changes in medicines.  Since this happened he has not taken any of his medications Since the incident he has felt completely fine other than mild headache.  No bruising or swelling or abrasions.  Past Medical History:  Diagnosis Date   Adenomatous colon polyp    Dr. Laural Golden (28 polyps on 1st endo, 3 on 2nd endo a year later.  8 on colonoscopy 02/26/14--recall 3 yrs.  Multiple adenomatous polyps on 09/20/17 TCS--recall 1 yr.. 11/2018 +aden pol->recall 3 yrs.   Aortic insufficiency    echo 03/25/12-EF>55%, mild-mod Aortic regurg, sclerotic aortic valve,    BPH (benign prostatic hyperplasia)    Dr. Lum Babe (pt is asymptomatic)   Carotid artery occlusion    carotid dopplers 02/06/12-patent  left carotid endarterectomy; right internal carotid 40-59%.  Repeat dopplers 03/2017, 40-59% stenosed bilat int ca's. Rpt 2019 and 2020->1-39% ica sten. 08/2020 R 50-69%, L mild->rpt 1 yr.   Chronic renal insufficiency, stage II (mild)    Borderline stage II/III--CrCl @ 60.  No RAS on 2003 angiogram (done for + renal artery dopplers--deemed eventually to have been a false positive).  Dr. Gwenlyn Found.   DDD (degenerative disc disease), lumbar 02/2013   with spondylosis and moderate spinal stenosis+ left L5 and S1 nerve root impingement   Ectatic abdominal aorta (HCC) 08/08/2016   2.8 cm at widest point: repeat aortic u/s 08/2021.   Essential tremor    Fatty liver 2006   Noted on abd u/s and renal u/s 2006/2007   History of hyperkalemia 02/2017   mild; recommended pt cut his ARB in half.   History of MI (myocardial infarction)    Large fixed inferior defect on myocardial perfusion imaging 03/2017.   Hyperlipidemia    Dr. Percival Spanish started statin 03/2017.     Hypertension    PAD (peripheral artery disease) ([place])    carotids: carotid endarterectomy 2010.  Dr. Percival Spanish started pt on statin for CV risk reduction 03/2017   Plantar wart of left foot 12/21/2012   Prediabetes 2019   A1c 5.9%   SOB (shortness of breath)    MET test 03/25/12-mildly abnormal  Solitary pulmonary nodule 03/14/2013   39m RUL 03/08/13--stable on repeat 07/2013, 03/2015, and 01/2016.  No additional imaging is required.   Tobacco dependence    ongoing as of 02/2016   Vitamin B12 deficiency without anemia 2016   Intrinsic factor NEG: home vit B12 injections started 11/17/14    Past Surgical History:  Procedure Laterality Date   AMPUTATION FINGER Left 05/16/2019   Procedure: Revision Amutation of Left  fourth finger;  Surgeon: GRoseanne Kaufman MD;  [facility] [facility]  Service: Orthopedics;  Laterality: Left;   APPENDECTOMY  remote   Arm surgery  July 2010   Left arm cyst/Lipoma   CARDIOVASCULAR STRESS TEST  03/2017   Low risk study  (likely artifact seen, but no reversible ischemia, EF 59%, no wall motion abnormality.   carotid dopplers  02/2015; 02/2016;12/2017;06/2019   2017:  R ICA 1-39%, L ICA 40-59%: no change from 2016. 03/2017 rpt 40-59% bilat ICA stenosis. 12/2017 1-39% bilat ICA stenosis.  06/2019->same. Rpt 18 mo.   CAROTID ENDARTERECTOMY  11/25/08   Left     ICA   COLONOSCOPY N/A 02/26/2014   Recall 3 yrs: (+ polypectomy--tubular adenoma) Procedure: COLONOSCOPY;  Surgeon: NRogene Houston MD;  [facility];  Service: Endoscopy;  Laterality: N/A;  830   COLONOSCOPY N/A 09/20/2017   Multiple adenomatous polyps: recall 1 yr.  Procedure: COLONOSCOPY;  Surgeon: RRogene Houston MD;  [facility];  Service: Endoscopy;  Laterality: N/A;  730   COLONOSCOPY N/A 11/21/2018   Procedure: COLONOSCOPY;  Surgeon: RRogene Houston MD;  [facility];  Service: Endoscopy;  Laterality: N/A;  930   COLONOSCOPY W/ POLYPECTOMY  09/20/2017; 11/21/18   2019 Adenomatous polyp: recall 3-5 yrs (Dr. RLaural Golden.  11/2018-adenomatous polyp->recall 3 yrs   I & D EXTREMITY Left 05/16/2019   Procedure: IRRIGATION AND DEBRIDEMENT OF LEFT INDEX, MIDDLE,FOURTH FINGERS;  Surgeon: GRoseanne Kaufman MD;  [facility] [facility]  Service: Orthopedics;  Laterality: Left;   NAILBED REPAIR Left 05/16/2019   Procedure: Reconstruction of bone and nail bed Left Index finger;  Surgeon: GRoseanne Kaufman MD;  [place] [facility]  Service: Orthopedics;  Laterality: Left;   OPEN REDUCTION INTERNAL FIXATION (ORIF) HAND Left 05/16/2019   Procedure: Reconstruction and Open Reduction and Internal fixation and Rotation flap Left Middle finger;  Surgeon: GRoseanne Kaufman MD;  [place] [facility]  Service: Orthopedics;  Laterality: Left;   POLYPECTOMY  09/20/2017   Procedure: POLYPECTOMY;  Surgeon: RRogene Houston MD;  [facility];  Service: Endoscopy;;  Ascending colon, Transverse colon, Hepatic flexure, Cecum   POLYPECTOMY  11/21/2018   Procedure:  POLYPECTOMY;  Surgeon: RRogene Houston MD;  [facility];  Service: Endoscopy;;   TONSILLECTOMY  remote   TONSILLECTOMY     TRANSTHORACIC ECHOCARDIOGRAM  03/2012   2013 --EF 55%, normal LV syst fxn, impaired diast relaxation, mild/mod aortic regurg.   UMBILICAL HERNIA REPAIR      Outpatient Medications Prior to Visit  Medication Sig Dispense Refill   aspirin EC 81 MG tablet Take 1 tablet (81 mg total) by mouth at bedtime.     atorvastatin (LIPITOR) 80 MG tablet TAKE 1 TABLET(80 MG) BY MOUTH DAILY 90 tablet 1   cyanocobalamin (,VITAMIN B-12,) 1000 MCG/ML injection Inject 1,000 mcg into the muscle every 30 (thirty) days.      ezetimibe (ZETIA) 10 MG tablet TAKE 1 TABLET(10 MG) BY MOUTH DAILY 90 tablet 1   felodipine (PLENDIL) 10 MG  24 hr tablet TAKE 1 TABLET(10 MG) BY MOUTH DAILY 90 tablet 1   hydrochlorothiazide (HYDRODIURIL) 25 MG tablet 1/2 tab po qd 45 tablet 1   omeprazole (PRILOSEC) 40 MG capsule Take 1 capsule (40 mg total) by mouth daily. 30 capsule 3   No facility-administered medications prior to visit.    Allergies  Allergen Reactions   Metoprolol Other (See Comments)    Fatigue    Other    Clonidine Derivatives Rash    ROS As per HPI  PE:    02/23/2022    3:19 PM 11/21/2021   10:01 AM 11/10/2021    9:32 AM  Vitals with BMI  Height _0  _1    Weight 138 lbs 13 oz 141 lbs 13 oz 147 lbs 13 oz  BMI 09.32 35.57   Systolic 322 025 427  Diastolic 57 89 90  Pulse 51 58 59   Orthostatics: Supine 100/70, 51.  Upright 110/70, 51.  Standing 106/64, 98.  Physical Exam  Gen: Alert, well appearing.  Patient is oriented to person, place, time, and situation. AFFECT: pleasant, lucid thought and speech. Head: No bruising, hematoma, or abrasion. CWC:BJSE: no injection, icteris, swelling, or exudate.  EOMI, PERRLA. Mouth: lips without lesion/swelling.  Oral mucosa pink and moist. Oropharynx without erythema, exudate, or swelling.  Neck: bilat carotid  bruits CV: RRR, no m/r/g.   LUNGS: CTA bilat, nonlabored resps, good aeration in all lung fields. ABD: soft, NT/ND EXT: no clubbing or cyanosis.  no edema.  Neuro: CN 2-12 intact bilaterally, strength 5/5 in proximal and distal upper extremities and lower extremities bilaterally.   He has just a trace of tremor in each hand, right more noticeable than left.Marland Kitchen  FNF nl bilat.  No ataxia. No pronator drift.  LABS:  Last metabolic panel Lab Results  Component Value Date   GLUCOSE 94 11/10/2021   NA 140 11/10/2021   K 3.9 11/10/2021   CL 100 11/10/2021   CO2 34 (H) 11/10/2021   BUN 14 11/10/2021   CREATININE 1.23 11/10/2021   GFRNONAA 51 (L) 05/16/2019   CALCIUM 9.6 11/10/2021   PROT 6.8 11/10/2021   ALBUMIN 4.2 11/10/2021   BILITOT 0.7 11/10/2021   ALKPHOS 93 11/10/2021   AST 14 11/10/2021   ALT 11 11/10/2021   ANIONGAP 10 05/16/2019   IMPRESSION AND PLAN:  #1 syncope, orthostatic hypotension.  This is in the context of being on antihypertensive medication. He does have history of carotid artery disease, most recent Dopplers 08/2020 showed right ICA 50 to 69% and left ICA milder.  Will recheck carotid Dopplers. Check CBC and be met today. May ultimately need repeat echo (hx mild/mod AI, most recent echo 2013) as well as outpatient rhythm monitoring. Stay off antihypertensives for now, encouraged improved hydration habits. Monitor blood pressure and heart rate at home. Okay to resume aspirin, atorvastatin, and Zetia.  An After Visit Summary was printed and given to the patient.  FOLLOW UP: Return in about 1 week (around 03/02/2022) for blood pressure.  Signed:  Crissie Sickles, MD           02/23/2022

## 2022-02-24 LAB — CBC WITH DIFFERENTIAL/PLATELET
Basophils Absolute: 0.1 10*3/uL (ref 0.0–0.1)
Basophils Relative: 1 % (ref 0.0–3.0)
Eosinophils Absolute: 0.3 10*3/uL (ref 0.0–0.7)
Eosinophils Relative: 3.8 % (ref 0.0–5.0)
HCT: 40.5 % (ref 39.0–52.0)
Hemoglobin: 13.8 g/dL (ref 13.0–17.0)
Lymphocytes Relative: 20.7 % (ref 12.0–46.0)
Lymphs Abs: 1.4 10*3/uL (ref 0.7–4.0)
MCHC: 34 g/dL (ref 30.0–36.0)
MCV: 86.6 fl (ref 78.0–100.0)
Monocytes Absolute: 0.6 10*3/uL (ref 0.1–1.0)
Monocytes Relative: 8.8 % (ref 3.0–12.0)
Neutro Abs: 4.4 10*3/uL (ref 1.4–7.7)
Neutrophils Relative %: 65.7 % (ref 43.0–77.0)
Platelets: 191 10*3/uL (ref 150.0–400.0)
RBC: 4.67 Mil/uL (ref 4.22–5.81)
RDW: 14.3 % (ref 11.5–15.5)
WBC: 6.8 10*3/uL (ref 4.0–10.5)

## 2022-02-24 LAB — COMPREHENSIVE METABOLIC PANEL
ALT: 9 U/L (ref 0–53)
AST: 12 U/L (ref 0–37)
Albumin: 4 g/dL (ref 3.5–5.2)
Alkaline Phosphatase: 89 U/L (ref 39–117)
BUN: 15 mg/dL (ref 6–23)
CO2: 32 mEq/L (ref 19–32)
Calcium: 9.6 mg/dL (ref 8.4–10.5)
Chloride: 98 mEq/L (ref 96–112)
Creatinine, Ser: 1.26 mg/dL (ref 0.40–1.50)
GFR: 54.49 mL/min — ABNORMAL LOW (ref >60.00)
Glucose, Bld: 90 mg/dL (ref 70–99)
Potassium: 4.4 mEq/L (ref 3.5–5.1)
Sodium: 137 mEq/L (ref 135–145)
Total Bilirubin: 0.6 mg/dL (ref 0.2–1.2)
Total Protein: 6.7 g/dL (ref 6.0–8.3)

## 2022-03-01 ENCOUNTER — Ambulatory Visit (HOSPITAL_COMMUNITY)
Admission: RE | Admit: 2022-03-01 | Discharge: 2022-03-01 | Disposition: A | Discharge status: Home / Self Care | Payer: Medicare PPO | Source: Ambulatory Visit | Attending: Family Medicine | Admitting: Family Medicine

## 2022-03-01 DIAGNOSIS — I6523 Occlusion and stenosis of bilateral carotid arteries: Secondary | ICD-10-CM | POA: Diagnosis not present

## 2022-03-01 DIAGNOSIS — R55 Syncope and collapse: Secondary | ICD-10-CM | POA: Insufficient documentation

## 2022-03-01 DIAGNOSIS — R0989 Other specified symptoms and signs involving the circulatory and respiratory systems: Secondary | ICD-10-CM | POA: Insufficient documentation

## 2022-03-01 DIAGNOSIS — I779 Disorder of arteries and arterioles, unspecified: Secondary | ICD-10-CM | POA: Diagnosis not present

## 2022-03-02 ENCOUNTER — Encounter: Payer: Self-pay | Admitting: Family Medicine

## 2022-03-02 ENCOUNTER — Ambulatory Visit: Payer: Medicare PPO | Admitting: Family Medicine

## 2022-03-02 VITALS — BP 123/62 | HR 67 | Temp 97.7°F | Ht 68.0 in | Wt 143.6 lb

## 2022-03-02 DIAGNOSIS — R9431 Abnormal electrocardiogram [ECG] [EKG]: Secondary | ICD-10-CM | POA: Diagnosis not present

## 2022-03-02 DIAGNOSIS — R001 Bradycardia, unspecified: Secondary | ICD-10-CM

## 2022-03-02 DIAGNOSIS — R55 Syncope and collapse: Secondary | ICD-10-CM | POA: Diagnosis not present

## 2022-03-03 ENCOUNTER — Other Ambulatory Visit: Payer: Self-pay

## 2022-03-03 MED ORDER — OMEPRAZOLE 40 MG PO CPDR: 40.0000 mg | DELAYED_RELEASE_CAPSULE | Freq: Every day | ORAL | 3 refills | Status: DC

## 2022-03-28 ENCOUNTER — Encounter: Payer: Self-pay | Admitting: Cardiology

## 2022-03-28 NOTE — Progress Notes (Unsigned)
Cardiology Office Note  Date: 03/29/2022   ID: Jeffrey [name], DOB 08-11-1941, MRN [id number]  PCP:  Tammi Sou, MD  Cardiologist:  Rozann Lesches, MD Electrophysiologist:  None   Chief Complaint  Patient presents with   History of syncope    History of Present Illness: Jeffrey [name] is a 81 y.o. male for cardiology consultation by Dr. Anitra Lauth for evaluation of bradycardia and orthostatic hypotension.  He is here today with his wife.  He describes an episode of syncope that occurred 1 morning back in June when he got up to make himself some coffee in the kitchen.  He remembers starting to make the coffee and then waking up on the floor thereafter.  Reports no definite sense that anything was amiss, no palpitations or chest pain.  Since that time he was taken off both Plendil and HCTZ and has had no further symptoms.  I reviewed his records, he was evaluated by Dr. Percival Spanish back in 2018 and treated medically for an abnormal Lexiscan Myoview at that point.  He had a fixed inferior defect suggestive of attenuation versus infarct scar and normal LVEF.  Also known vascular disease including carotid artery disease with left CEA.  Remote echocardiogram from 2013 indicated mild to moderate aortic regurgitation.  He did not have any significant orthostatic blood pressure change today, heart rate remained in the 50s to 60s throughout.  I personally reviewed his ECG which shows sinus rhythm with ventricular bigeminy.  I reviewed the remainder of his medications which are noted below.  He has continued on aspirin along with Lipitor and Zetia, LDL 71 by last check.  I also reviewed his carotid Dopplers from June.  Past Medical History:  Diagnosis Date   Adenomatous colon polyp    Dr. Laural Golden (28 polyps on 1st endo, 3 on 2nd endo a year later.  8 on colonoscopy 02/26/14--recall 3 yrs.  Multiple adenomatous polyps on 09/20/17 TCS--recall 1 yr.. 11/2018 +aden pol->recall 3 yrs.   Aortic  insufficiency    Mild to moderate 2013   BPH (benign prostatic hyperplasia)    Dr. Lum Babe (pt is asymptomatic)   Carotid artery disease ([place])    Left CEA   Chronic renal insufficiency, stage II (mild)    DDD (degenerative disc disease), lumbar 02/2013   with spondylosis and moderate spinal stenosis+ left L5 and S1 nerve root impingement   Ectatic abdominal aorta ([place]) 08/08/2016   2.8 cm at widest point: repeat aortic u/s 08/2021.   Essential tremor    Fatty liver 2006   Noted on abd u/s and renal u/s 2006/2007   History of hyperkalemia 02/2017   mild; recommended pt cut his ARB in half.   History of MI (myocardial infarction)    Large fixed inferior defect on myocardial perfusion imaging 03/2017.   Hyperlipidemia    Dr. Percival Spanish started statin 03/2017.     Hypertension    PAD (peripheral artery disease) ([place])    carotids: carotid endarterectomy 2010.  Dr. Percival Spanish started pt on statin for CV risk reduction 03/2017   Plantar wart of left foot 12/21/2012   Prediabetes 2019   A1c 5.9%   Solitary pulmonary nodule 03/14/2013   85m RUL 03/08/13--stable on repeat 07/2013, 03/2015, and 01/2016.  No additional imaging is required.   Tobacco dependence    ongoing as of 02/2016   Vitamin B12 deficiency without anemia 2016   Intrinsic factor NEG: home vit B12 injections started 11/17/14    Past  Surgical History:  Procedure Laterality Date   AMPUTATION FINGER Left 05/16/2019   Procedure: Revision Amutation of Left  fourth finger;  Surgeon: Roseanne Kaufman, MD;  Location: [place];  Service: Orthopedics;  Laterality: Left;   APPENDECTOMY  remote   Arm surgery  July 2010   Left arm cyst/Lipoma   CARDIOVASCULAR STRESS TEST  03/2017   Low risk study (likely artifact seen, but no reversible ischemia, EF 59%, no wall motion abnormality.   carotid dopplers  02/2015; 02/2016;12/2017;06/2019   2017:  R ICA 1-39%, L ICA 40-59%: no change from 2016. 03/2017 rpt 40-59% bilat ICA stenosis. 12/2017 1-39% bilat ICA  stenosis.  06/2019->same. Rpt 18 mo.   CAROTID ENDARTERECTOMY  11/25/08   Left     ICA   COLONOSCOPY N/A 02/26/2014   Recall 3 yrs: (+ polypectomy--tubular adenoma) Procedure: COLONOSCOPY;  Surgeon: Rogene Houston, MD;  [facility];  Service: Endoscopy;  Laterality: N/A;  830   COLONOSCOPY N/A 09/20/2017   Multiple adenomatous polyps: recall 1 yr.  Procedure: COLONOSCOPY;  Surgeon: Rogene Houston, MD;  [facility];  Service: Endoscopy;  Laterality: N/A;  730   COLONOSCOPY N/A 11/21/2018   Procedure: COLONOSCOPY;  Surgeon: Rogene Houston, MD;  [facility];  Service: Endoscopy;  Laterality: N/A;  930   COLONOSCOPY W/ POLYPECTOMY  09/20/2017; 11/21/18   2019 Adenomatous polyp: recall 3-5 yrs (Dr. Laural Golden).  11/2018-adenomatous polyp->recall 3 yrs   I & D EXTREMITY Left 05/16/2019   Procedure: IRRIGATION AND DEBRIDEMENT OF LEFT INDEX, MIDDLE,FOURTH FINGERS;  Surgeon: Roseanne Kaufman, MD;  Location: [place];  Service: Orthopedics;  Laterality: Left;   NAILBED REPAIR Left 05/16/2019   Procedure: Reconstruction of bone and nail bed Left Index finger;  Surgeon: Roseanne Kaufman, MD;  Location: [place];  Service: Orthopedics;  Laterality: Left;   OPEN REDUCTION INTERNAL FIXATION (ORIF) HAND Left 05/16/2019   Procedure: Reconstruction and Open Reduction and Internal fixation and Rotation flap Left Middle finger;  Surgeon: Roseanne Kaufman, MD;  Location: [place];  Service: Orthopedics;  Laterality: Left;   POLYPECTOMY  09/20/2017   Procedure: POLYPECTOMY;  Surgeon: Rogene Houston, MD;  [facility];  Service: Endoscopy;;  Ascending colon, Transverse colon, Hepatic flexure, Cecum   POLYPECTOMY  11/21/2018   Procedure: POLYPECTOMY;  Surgeon: Rogene Houston, MD;  [facility];  Service: Endoscopy;;   TONSILLECTOMY  remote   TONSILLECTOMY     TRANSTHORACIC ECHOCARDIOGRAM  03/2012   2013 --EF 55%, normal LV syst fxn, impaired diast relaxation, mild/mod aortic  regurg.   UMBILICAL HERNIA REPAIR      Current Outpatient Medications  Medication Sig Dispense Refill   aspirin EC 81 MG tablet Take 1 tablet (81 mg total) by mouth at bedtime.     atorvastatin (LIPITOR) 80 MG tablet TAKE 1 TABLET(80 MG) BY MOUTH DAILY 90 tablet 1   cyanocobalamin (,VITAMIN B-12,) 1000 MCG/ML injection Inject 1,000 mcg into the muscle every 30 (thirty) days.      ezetimibe (ZETIA) 10 MG tablet TAKE 1 TABLET(10 MG) BY MOUTH DAILY 90 tablet 1   omeprazole (PRILOSEC) 40 MG capsule Take 1 capsule (40 mg total) by mouth daily. 30 capsule 3   No current facility-administered medications for this visit.   Allergies:  Metoprolol, Other, and Clonidine derivatives   Social History: The patient  reports that he has been smoking cigarettes. He has a 60.00 pack-year smoking history. He has never used smokeless  tobacco. He reports that he does not drink alcohol and does not use drugs.   Family History: The patient's family history includes Cancer in his brother; Coronary artery disease in his mother; Diabetes in his mother; Heart disease in his brother; Hypertension in his mother; Parkinson's disease in his mother; Stroke in his mother and sister.   ROS: No palpitations, no orthopnea or PND.  Physical Exam: VS:  BP 134/68 (BP Location: Right Arm, Cuff Size: Normal)   Pulse (!) 55   Ht '5\' 9"'$  (1.753 m)   Wt 141 lb (64 kg)   BMI 20.82 kg/m , BMI Body mass index is 20.82 kg/m.  Wt Readings from Last 3 Encounters:  03/29/22 141 lb (64 kg)  03/02/22 143 lb 9.6 oz (65.1 kg)  02/23/22 138 lb 12.8 oz (63 kg)    General: Patient appears comfortable at rest. HEENT: Conjunctiva and lids normal, oropharynx clear. Neck: Supple, no elevated JVP, right carotid bruit, no thyromegaly. Lungs: Clear to auscultation, nonlabored breathing at rest. Cardiac: Regular rate and rhythm with occasional ectopy, no S3, 1/6 systolic murmur. Abdomen: Soft, nontender, bowel sounds present. Extremities: No  pitting edema, distal pulses 1-2+. Skin: Warm and dry. Musculoskeletal: No kyphosis. Neuropsychiatric: Alert and oriented x3, affect grossly appropriate.  ECG:  An ECG dated 03/02/2022 was personally reviewed today and demonstrated:  Sinus bradycardia with anterolateral ST-T wave abnormalities, rule out old infarct pattern.  Recent Labwork: 11/10/2021: TSH 3.41 02/23/2022: ALT 9; AST 12; BUN 15; Creatinine, Ser 1.26; Hemoglobin 13.8; Platelets 191.0; Potassium 4.4; Sodium 137     Component Value Date/Time   CHOL 139 11/10/2021 1005   TRIG 109.0 11/10/2021 1005   HDL 45.60 11/10/2021 1005   CHOLHDL 3 11/10/2021 1005   VLDL 21.8 11/10/2021 1005   LDLCALC 71 11/10/2021 1005    Other Studies Reviewed Today:  Lexiscan Myoview 03/13/2017: Nuclear stress EF: 59%. No T wave inversion was noted during stress. There was no ST segment deviation noted during stress. Defect 1: There is a large defect of moderate severity present in the basal inferoseptal, mid inferoseptal, mid inferior, apical inferior and apical lateral location.   Mostly fixed large size, moderate intensity inferior and inferoseptal perfusion defect (SDS 2) which is likely artifact. No significant reversible ischemia. LVEF 59% with normal wall motion. This is a low risk study.  Carotid Doppler 03/01/2022: IMPRESSION: Right:   Heterogeneous and partially calcified plaque at the right carotid bifurcation, with discordant results regarding degree of stenosis by established duplex criteria. Peak velocity suggests 50%-69% stenosis, with the ICA/ CCA ratio suggesting a lesser degree of stenosis. Additionally, note that the flow velocities of the right ICA were obtained from an area distal to the maximum narrowing due to the presence of anterior wall plaque with shadowing and may be underestimating the percentage of ICA stenosis. If establishing a more accurate degree of stenosis is required, cerebral angiogram should be  considered, or as a second best test, CTA.   Left:   Surgical changes of left carotid endarterectomy, with evidence of developing fibro-intimal hyperplasia. Note that established duplex criteria have not been validated in the setting of prior carotid endarterectomy, however, the velocities indicate less than 50% luminal narrowing.  Assessment and Plan:  1.  Single episode of syncope as described above.  Possibly related to orthostatic hypotension, he has had no further symptoms since discontinuation of Plendil and HCTZ by PCP.  He was not orthostatic today on evaluation.  Does appear to be bradycardic at  rest, although in ventricular bigeminy by ECG.  Current degree of carotid artery disease would not result in syncope.  Plan is to obtain a ZIO XT to investigate heart rate variability and assess for any potential arrhythmia.  Also better quantify PVCs.  2.  Vascular disease with mixed hyperlipidemia.  He remains on aspirin along with Lipitor and Zetia.  LDL 71.  Carotid Dopplers noted above with prior history of left CEA.  He does not report any progressive claudication.  3.  Presumed ischemic heart disease based on Myoview from 2018 showing large region of inferior/inferoseptal scar but no active ischemia and normal LVEF.  Does not describe any definite ischemia.  If increased PVC burden, will check echocardiogram to reevaluate LVEF.  Medication Adjustments/Labs and Tests Ordered: Current medicines are reviewed at length with the patient today.  Concerns regarding medicines are outlined above.   Tests Ordered: Orders Placed This Encounter  Procedures   EKG 12-Lead    Medication Changes: No orders of the defined types were placed in this encounter.   Disposition:  Follow up  test results.  Signed, Satira Sark, MD, [facility] 03/29/2022 11:15 AM    [facility] at [place], [place], [place] [address] Phone: [phone]; Fax: [phone]

## 2022-03-29 ENCOUNTER — Other Ambulatory Visit: Payer: Self-pay | Admitting: Cardiology

## 2022-03-29 ENCOUNTER — Ambulatory Visit: Payer: Medicare PPO | Admitting: Cardiology

## 2022-03-29 ENCOUNTER — Ambulatory Visit (INDEPENDENT_AMBULATORY_CARE_PROVIDER_SITE_OTHER): Payer: Medicare PPO

## 2022-03-29 ENCOUNTER — Encounter: Payer: Self-pay | Admitting: Cardiology

## 2022-03-29 VITALS — BP 134/68 | HR 55 | Ht 69.0 in | Wt 141.0 lb

## 2022-03-29 DIAGNOSIS — R001 Bradycardia, unspecified: Secondary | ICD-10-CM

## 2022-03-29 DIAGNOSIS — I6523 Occlusion and stenosis of bilateral carotid arteries: Secondary | ICD-10-CM

## 2022-03-29 DIAGNOSIS — I493 Ventricular premature depolarization: Secondary | ICD-10-CM

## 2022-03-29 DIAGNOSIS — Z87898 Personal history of other specified conditions: Secondary | ICD-10-CM | POA: Diagnosis not present

## 2022-03-29 DIAGNOSIS — I259 Chronic ischemic heart disease, unspecified: Secondary | ICD-10-CM

## 2022-03-29 NOTE — Patient Instructions (Addendum)
Medication Instructions:  Your physician recommends that you continue on your current medications as directed. Please refer to the Current Medication list given to you today.  Labwork: none  Testing/Procedures: Your physician has recommended that you wear a Zio monitor.   This monitor is a medical device that records the heart's electrical activity. Doctors most often use these monitors to diagnose arrhythmias. Arrhythmias are problems with the speed or rhythm of the heartbeat. The monitor is a small device applied to your chest. You can wear one while you do your normal daily activities. While wearing this monitor if you have any symptoms to push the button and record what you felt. Once you have worn this monitor for the period of time provider prescribed (for 7 days), you will return the monitor device in the postage paid box. Once it is returned they will download the data collected and provide [place] with a report which the provider will then review and we will call you with those results. Important tips:  Avoid showering during the first 24 hours of wearing the monitor. Avoid excessive sweating to help maximize wear time. Do not submerge the device, no hot tubs, and no swimming pools. Keep any lotions or oils away from the patch. After 24 hours you may shower with the patch on. Take brief showers with your back facing the shower head.  Do not remove patch once it has been placed because that will interrupt data and decrease adhesive wear time. Push the button when you have any symptoms and write down what you were feeling. Once you have completed wearing your monitor, remove and place into box which has postage paid and place in your outgoing mailbox.  If for some reason you have misplaced your box then call our office and we can provide another box and/or mail it off for you.  Follow-Up: Your physician recommends that you schedule a follow-up appointment in: pending  Any Other Special  Instructions Will Be Listed Below (If Applicable).  If you need a refill on your cardiac medications before your next appointment, please call your pharmacy.

## 2022-04-10 ENCOUNTER — Encounter: Payer: Self-pay | Admitting: Family Medicine

## 2022-04-10 ENCOUNTER — Ambulatory Visit: Payer: Medicare PPO | Admitting: Family Medicine

## 2022-04-10 VITALS — BP 118/60 | HR 51 | Temp 98.3°F | Ht 69.0 in | Wt 143.8 lb

## 2022-04-10 DIAGNOSIS — Z87898 Personal history of other specified conditions: Secondary | ICD-10-CM | POA: Diagnosis not present

## 2022-04-10 DIAGNOSIS — M79605 Pain in left leg: Secondary | ICD-10-CM

## 2022-04-10 DIAGNOSIS — E538 Deficiency of other specified B group vitamins: Secondary | ICD-10-CM

## 2022-04-10 DIAGNOSIS — I493 Ventricular premature depolarization: Secondary | ICD-10-CM | POA: Diagnosis not present

## 2022-04-10 MED ORDER — CYANOCOBALAMIN 1000 MCG/ML IJ SOLN
1000.0000 ug | Freq: Once | INTRAMUSCULAR | Status: AC
  Administered 2022-04-10: 1000 ug via INTRAMUSCULAR

## 2022-04-10 NOTE — Progress Notes (Signed)
OFFICE VISIT  04/10/2022  CC:  Chief Complaint  Patient presents with   Leg pain    Left; calf to hamstring are. Pain occurring for 1 week today. Pt has not taken any otc meds besides normal regimen of aspirin.    Patient is a 81 y.o. male who presents for left leg pain.  HPI: 1 week ago acute onset of pain in back of left leg from mid hamstring down through popliteal foss and into proximal 1/2 of calf.  No swelling or redness or tenderness.  Present only when standing and when trying to walk. Worse with leg flexion at the knee. No low back pain, no paresthesias, no leg weakness, no buttocks pain.  No hx of DVT, no recent prolonged immobilization, no recent surgeries.  ROS as above, plus--> no fevers, no CP, no SOB, no wheezing, no cough, no dizziness, no HAs, no rashes, no melena/hematochezia.  No polyuria or polydipsia.  No myalgias or arthralgias.  No focal weakness, paresthesias, or tremors.  No acute vision or hearing abnormalities.  No dysuria or unusual/new urinary urgency or frequency.  No n/v/d or abd pain.  No palpitations.     Past Medical History:  Diagnosis Date   Adenomatous colon polyp    Dr. Laural Golden (28 polyps on 1st endo, 3 on 2nd endo a year later.  8 on colonoscopy 02/26/14--recall 3 yrs.  Multiple adenomatous polyps on 09/20/17 TCS--recall 1 yr.. 11/2018 +aden pol->recall 3 yrs.   Aortic insufficiency    Mild to moderate 2013   BPH (benign prostatic hyperplasia)    Dr. Lum Babe (pt is asymptomatic)   Carotid artery disease ([place])    Left CEA   Chronic renal insufficiency, stage II (mild)    DDD (degenerative disc disease), lumbar 02/2013   with spondylosis and moderate spinal stenosis+ left L5 and S1 nerve root impingement   Ectatic abdominal aorta ([place]) 08/08/2016   2.8 cm at widest point: repeat aortic u/s 08/2021.   Essential tremor    Fatty liver 2006   Noted on abd u/s and renal u/s 2006/2007   History of hyperkalemia 02/2017   mild; recommended pt cut his  ARB in half.   History of MI (myocardial infarction)    Large fixed inferior defect on myocardial perfusion imaging 03/2017.   Hyperlipidemia    Dr. Percival Spanish started statin 03/2017.     Hypertension    PAD (peripheral artery disease) ([place])    carotids: carotid endarterectomy 2010.  Dr. Percival Spanish started pt on statin for CV risk reduction 03/2017   Plantar wart of left foot 12/21/2012   Prediabetes 2019   A1c 5.9%   Solitary pulmonary nodule 03/14/2013   19m RUL 03/08/13--stable on repeat 07/2013, 03/2015, and 01/2016.  No additional imaging is required.   Tobacco dependence    ongoing as of 02/2016   Vitamin B12 deficiency without anemia 2016   Intrinsic factor NEG: home vit B12 injections started 11/17/14    Past Surgical History:  Procedure Laterality Date   AMPUTATION FINGER Left 05/16/2019   Procedure: Revision Amutation of Left  fourth finger;  Surgeon: GRoseanne Kaufman MD;  Location: [place]  Service: Orthopedics;  Laterality: Left;   APPENDECTOMY  remote   Arm surgery  July 2010   Left arm cyst/Lipoma   CARDIOVASCULAR STRESS TEST  03/2017   Low risk study (likely artifact seen, but no reversible ischemia, EF 59%, no wall motion abnormality.   carotid dopplers  02/2015; 02/2016;12/2017;06/2019   2017:  R  ICA 1-39%, L ICA 40-59%: no change from 2016. 03/2017 rpt 40-59% bilat ICA stenosis. 12/2017 1-39% bilat ICA stenosis.  06/2019->same. Rpt 18 mo.   CAROTID ENDARTERECTOMY  11/25/08   Left     ICA   COLONOSCOPY N/A 02/26/2014   Recall 3 yrs: (+ polypectomy--tubular adenoma) Procedure: COLONOSCOPY;  Surgeon: Rogene Houston, MD;  [facility];  Service: Endoscopy;  Laterality: N/A;  830   COLONOSCOPY N/A 09/20/2017   Multiple adenomatous polyps: recall 1 yr.  Procedure: COLONOSCOPY;  Surgeon: Rogene Houston, MD;  [facility];  Service: Endoscopy;  Laterality: N/A;  730   COLONOSCOPY N/A 11/21/2018   Procedure: COLONOSCOPY;  Surgeon: Rogene Houston, MD;  [facility];  Service: Endoscopy;  Laterality: N/A;  930   COLONOSCOPY W/ POLYPECTOMY  09/20/2017; 11/21/18   2019 Adenomatous polyp: recall 3-5 yrs (Dr. Laural Golden).  11/2018-adenomatous polyp->recall 3 yrs   I & D EXTREMITY Left 05/16/2019   Procedure: IRRIGATION AND DEBRIDEMENT OF LEFT INDEX, MIDDLE,FOURTH FINGERS;  Surgeon: Roseanne Kaufman, MD;  Location: [place];  Service: Orthopedics;  Laterality: Left;   NAILBED REPAIR Left 05/16/2019   Procedure: Reconstruction of bone and nail bed Left Index finger;  Surgeon: Roseanne Kaufman, MD;  Location: [place];  Service: Orthopedics;  Laterality: Left;   OPEN REDUCTION INTERNAL FIXATION (ORIF) HAND Left 05/16/2019   Procedure: Reconstruction and Open Reduction and Internal fixation and Rotation flap Left Middle finger;  Surgeon: Roseanne Kaufman, MD;  Location: [place];  Service: Orthopedics;  Laterality: Left;   POLYPECTOMY  09/20/2017   Procedure: POLYPECTOMY;  Surgeon: Rogene Houston, MD;  [facility];  Service: Endoscopy;;  Ascending colon, Transverse colon, Hepatic flexure, Cecum   POLYPECTOMY  11/21/2018   Procedure: POLYPECTOMY;  Surgeon: Rogene Houston, MD;  [facility];  Service: Endoscopy;;   TONSILLECTOMY  remote   TONSILLECTOMY     TRANSTHORACIC ECHOCARDIOGRAM  03/2012   2013 --EF 55%, normal LV syst fxn, impaired diast relaxation, mild/mod aortic regurg.   UMBILICAL HERNIA REPAIR      Outpatient Medications Prior to Visit  Medication Sig Dispense Refill   aspirin EC 81 MG tablet Take 1 tablet (81 mg total) by mouth at bedtime.     atorvastatin (LIPITOR) 80 MG tablet TAKE 1 TABLET(80 MG) BY MOUTH DAILY 90 tablet 1   cyanocobalamin (,VITAMIN B-12,) 1000 MCG/ML injection Inject 1,000 mcg into the muscle every 30 (thirty) days.      ezetimibe (ZETIA) 10 MG tablet TAKE 1 TABLET(10 MG) BY MOUTH DAILY 90 tablet 1   omeprazole (PRILOSEC) 40 MG capsule Take 1 capsule (40 mg total) by mouth daily. 30 capsule 3   No facility-administered  medications prior to visit.    Allergies  Allergen Reactions   Metoprolol Other (See Comments)    Fatigue    Other    Clonidine Derivatives Rash    ROS As per HPI  PE:    04/10/2022    3:22 PM 03/29/2022   10:54 AM 03/29/2022   10:52 AM  Vitals with BMI  Height '5\' 9"'$     Weight 143 lbs 13 oz    BMI 30.86    Systolic 578 469 629  Diastolic 60 68 72  Pulse 51 55 60     Physical Exam  Gen: Alert, well appearing.  Patient is oriented to person, place, time, and situation.a AFFECT: pleasant, lucid thought and speech. Legs: neither side with any  erythema, swelling, or TTP.  No streaking or cord palpable on L.  Supine SLR NEG bilat. 5/5 strength prox/dist.  No DTR in patellar or achilles bilat. No sensory deficit.  LABS:  Last CBC Lab Results  Component Value Date   WBC 6.8 02/23/2022   HGB 13.8 02/23/2022   HCT 40.5 02/23/2022   MCV 86.6 02/23/2022   MCH 29.4 05/16/2019   RDW 14.3 02/23/2022   PLT 191.0 80/22/3361   Last metabolic panel Lab Results  Component Value Date   GLUCOSE 90 02/23/2022   NA 137 02/23/2022   K 4.4 02/23/2022   CL 98 02/23/2022   CO2 32 02/23/2022   BUN 15 02/23/2022   CREATININE 1.26 02/23/2022   GFRNONAA 51 (L) 05/16/2019   CALCIUM 9.6 02/23/2022   PROT 6.7 02/23/2022   ALBUMIN 4.0 02/23/2022   BILITOT 0.6 02/23/2022   ALKPHOS 89 02/23/2022   AST 12 02/23/2022   ALT 9 02/23/2022   ANIONGAP 10 05/16/2019    IMPRESSION AND PLAN:  Acute left posterior leg pain, unknown etiology, unknown prognosis. Need to rule out DVT--we ordered and scheduled a left lower extremity venous Doppler ultrasound for [facility] radiology tomorrow. If this is negative then will consider obtaining lumbar MRI versus referral to orthopedics or neurosurgery for further evaluation for possible spinal nerve root compression.  An After Visit Summary was printed and given to the patient.  FOLLOW UP: No follow-ups on file.  Signed:  Crissie Sickles, MD            04/10/2022

## 2022-04-10 NOTE — Patient Instructions (Signed)
[facility] at [facility] [address] [place],  [place]  [address] Main: [phone]

## 2022-04-11 ENCOUNTER — Telehealth: Payer: Self-pay

## 2022-04-11 ENCOUNTER — Ambulatory Visit (HOSPITAL_COMMUNITY)
Admission: RE | Admit: 2022-04-11 | Discharge: 2022-04-11 | Disposition: A | Discharge status: Home / Self Care | Payer: Medicare PPO | Source: Ambulatory Visit | Attending: Family Medicine | Admitting: Family Medicine

## 2022-04-11 DIAGNOSIS — M79662 Pain in left lower leg: Secondary | ICD-10-CM | POA: Diagnosis not present

## 2022-04-11 DIAGNOSIS — M79605 Pain in left leg: Secondary | ICD-10-CM | POA: Diagnosis not present

## 2022-04-11 HISTORY — PX: OTHER SURGICAL HISTORY: SHX169

## 2022-04-11 MED ORDER — MELOXICAM 15 MG PO TABS: 15.0000 mg | ORAL_TABLET | Freq: Every day | ORAL | 0 refills | Status: DC

## 2022-04-11 NOTE — Telephone Encounter (Signed)
-----   Message from Tammi Sou, MD sent at 04/11/2022 12:27 PM EDT ----- Notify patient that his ultrasound showed that he DOES NOT have a blood clot in his leg.  I am going to treat this as muscular pain at this time. Please do prescription for meloxicam 15 mg, 1 tab p.o. daily x10 days, #10, no refill. I recommend he stop his aspirin while taking this medication. Follow-up in office in 2 weeks.

## 2022-04-13 ENCOUNTER — Ambulatory Visit (HOSPITAL_COMMUNITY)
Admission: RE | Admit: 2022-04-13 | Discharge: 2022-04-13 | Disposition: A | Discharge status: Home / Self Care | Payer: Medicare PPO | Source: Ambulatory Visit | Attending: Family Medicine | Admitting: Family Medicine

## 2022-04-13 ENCOUNTER — Telehealth: Payer: Self-pay

## 2022-04-13 DIAGNOSIS — M79605 Pain in left leg: Secondary | ICD-10-CM

## 2022-04-13 DIAGNOSIS — M4716 Other spondylosis with myelopathy, lumbar region: Secondary | ICD-10-CM

## 2022-04-13 DIAGNOSIS — M545 Low back pain, unspecified: Secondary | ICD-10-CM | POA: Diagnosis not present

## 2022-04-13 NOTE — Telephone Encounter (Signed)
Okay:  x-rays of his leg as well as his low back have been ordered for [facility] radiology.

## 2022-04-13 NOTE — Telephone Encounter (Signed)
Patient requesting to get Xray of leg. Still not any better.  Please call wife Izora Gala or daughter Kenney Houseman [phone], both on [place].

## 2022-04-13 NOTE — Telephone Encounter (Signed)
Pt's recent DVT imaging was negative for blood clot. Mentioned in note: "If this is negative then will consider obtaining lumbar MRI versus referral to orthopedics or neurosurgery for further evaluation for possible spinal nerve root compression"  Please review and advise

## 2022-04-13 NOTE — Telephone Encounter (Signed)
Pt's daughter advised imaging order. Mychart message with imaging info

## 2022-04-14 ENCOUNTER — Other Ambulatory Visit: Payer: Self-pay | Admitting: Family Medicine

## 2022-04-14 DIAGNOSIS — M5416 Radiculopathy, lumbar region: Secondary | ICD-10-CM

## 2022-04-14 NOTE — Telephone Encounter (Signed)
Daughter advised imaging ordered.

## 2022-04-14 NOTE — Telephone Encounter (Signed)
Please advise if MRI needed

## 2022-04-14 NOTE — Telephone Encounter (Signed)
Patient's daughter calling to request order for MRI on her dad, IF Dr. Anitra Lauth thinks he should have one.  Please call Kenney Houseman [phone]

## 2022-04-14 NOTE — Telephone Encounter (Signed)
Okay. MRI lumbar spine ordered for [facility] radiology.   Radiology department will call patient to schedule.

## 2022-04-17 ENCOUNTER — Telehealth: Payer: Self-pay | Admitting: *Deleted

## 2022-04-17 ENCOUNTER — Encounter: Payer: Self-pay | Admitting: Family Medicine

## 2022-04-17 DIAGNOSIS — I259 Chronic ischemic heart disease, unspecified: Secondary | ICD-10-CM

## 2022-04-17 DIAGNOSIS — I493 Ventricular premature depolarization: Secondary | ICD-10-CM

## 2022-04-17 NOTE — Telephone Encounter (Signed)
-----   Message from Satira Sark, MD sent at 04/15/2022  1:52 PM EDT ----- Results reviewed.  Overall heart rhythm was normal, he does tend to have a slow heart rate at baseline with average heart rate 57 bpm.  Also atrial and ventricular ectopy with frequent PVCs representing approximately 11% total beats.  Would not necessarily start any new medications at this time, we will continue with observation.  Please go ahead and obtain an echocardiogram to ensure normal LVEF however.

## 2022-04-17 NOTE — Telephone Encounter (Signed)
Patient's wife Izora Gala informed and verbalized understanding of plan. Copy sent to PCP

## 2022-04-18 ENCOUNTER — Telehealth: Payer: Self-pay

## 2022-04-18 DIAGNOSIS — M4716 Other spondylosis with myelopathy, lumbar region: Secondary | ICD-10-CM

## 2022-04-18 DIAGNOSIS — M79605 Pain in left leg: Secondary | ICD-10-CM

## 2022-04-18 NOTE — Telephone Encounter (Signed)
Pt's daughter advised new imaging order completed and will call pt to schedule.

## 2022-04-18 NOTE — Telephone Encounter (Signed)
-----   Message from Tammi Sou, MD sent at 04/18/2022  9:58 AM EDT ----- Ok pls re-order the MRI for a different [place] imaging location: try [facility].

## 2022-04-18 NOTE — Telephone Encounter (Signed)
The new order has been placed. Can you follow up on this if PA needed?  Pt was scheduled for MRI at [facility] for 8/21.

## 2022-04-19 ENCOUNTER — Ambulatory Visit (INDEPENDENT_AMBULATORY_CARE_PROVIDER_SITE_OTHER): Payer: Medicare PPO

## 2022-04-19 DIAGNOSIS — I259 Chronic ischemic heart disease, unspecified: Secondary | ICD-10-CM | POA: Diagnosis not present

## 2022-04-19 DIAGNOSIS — I493 Ventricular premature depolarization: Secondary | ICD-10-CM | POA: Diagnosis not present

## 2022-04-19 LAB — ECHOCARDIOGRAM COMPLETE
AR max vel: 2.17 cm2
AV Peak grad: 10.5 mmHg
AV Vena cont: 0.39 cm
Ao pk vel: 1.62 m/s
Area-P 1/2: 2.59 cm2
Calc EF: 75.2 %
MV M vel: 4.78 m/s
MV Peak grad: 91.4 mmHg
P 1/2 time: 1027 msec
S' Lateral: 1.76 cm
Single Plane A2C EF: 73.3 %
Single Plane A4C EF: 75.8 %

## 2022-04-27 ENCOUNTER — Ambulatory Visit
Admission: RE | Admit: 2022-04-27 | Discharge: 2022-04-27 | Disposition: A | Discharge status: Home / Self Care | Payer: Medicare PPO | Source: Ambulatory Visit | Attending: Family Medicine | Admitting: Family Medicine

## 2022-04-27 DIAGNOSIS — M4716 Other spondylosis with myelopathy, lumbar region: Secondary | ICD-10-CM

## 2022-04-27 DIAGNOSIS — M79605 Pain in left leg: Secondary | ICD-10-CM | POA: Diagnosis not present

## 2022-04-27 DIAGNOSIS — M545 Low back pain, unspecified: Secondary | ICD-10-CM | POA: Diagnosis not present

## 2022-04-27 DIAGNOSIS — M47816 Spondylosis without myelopathy or radiculopathy, lumbar region: Secondary | ICD-10-CM | POA: Diagnosis not present

## 2022-04-27 DIAGNOSIS — M48061 Spinal stenosis, lumbar region without neurogenic claudication: Secondary | ICD-10-CM | POA: Diagnosis not present

## 2022-04-29 ENCOUNTER — Other Ambulatory Visit: Payer: Medicare PPO

## 2022-05-01 ENCOUNTER — Other Ambulatory Visit (HOSPITAL_COMMUNITY): Payer: Medicare PPO

## 2022-05-01 ENCOUNTER — Encounter: Payer: Self-pay | Admitting: Family Medicine

## 2022-05-03 ENCOUNTER — Ambulatory Visit: Payer: Medicare PPO | Admitting: Family Medicine

## 2022-05-03 ENCOUNTER — Encounter: Payer: Self-pay | Admitting: Family Medicine

## 2022-05-03 VITALS — BP 118/60 | HR 49 | Temp 98.2°F | Ht 69.0 in | Wt 142.0 lb

## 2022-05-03 DIAGNOSIS — M79605 Pain in left leg: Secondary | ICD-10-CM

## 2022-05-03 MED ORDER — HYDROCODONE-ACETAMINOPHEN 5-325 MG PO TABS: ORAL_TABLET | ORAL | 0 refills | Status: DC

## 2022-05-03 MED ORDER — HYDROCHLOROTHIAZIDE 25 MG PO TABS
ORAL_TABLET | ORAL | 3 refills | Status: DC
Start: 2022-05-03 — End: 2022-05-08

## 2022-05-03 MED ORDER — ATORVASTATIN CALCIUM 80 MG PO TABS: ORAL_TABLET | ORAL | 3 refills | Status: DC

## 2022-05-03 NOTE — Progress Notes (Signed)
OFFICE VISIT  05/03/2022  CC:  Chief Complaint  Patient presents with   Follow-up    Left leg pain; patient states pain has not improved much. Patient took Meloxicam for 3 days but stopped due to side effects. Pt has had MRI, DVT and XR completed.    Patient is a 81 y.o. male who presents accompanied by his wife for 3 week follow up left leg pain. A/P as of last visit: "Acute left posterior leg pain, unknown etiology, unknown prognosis. Need to rule out DVT--we ordered and scheduled a left lower extremity venous Doppler ultrasound for [facility] radiology tomorrow. If this is negative then will consider obtaining lumbar MRI versus referral to orthopedics or neurosurgery for further evaluation for possible spinal nerve root compression."  INTERIM HX: --His DVT study was negative. --Left femur and tib-fib x-rays normal. --MRI of the lumbar spine showed multilevel degenerative disc disease and spondylosis with some areas of spinal stenosis. He had several areas of moderate to severe foraminal narrowing, none of which correlated with his history or exam.  His severe pain continues. He rates it 8 out of 10 intensity.  He is not doing much at all at home, it impairs his sleep. Is present when he is at rest but much worse when he is up and moving around. Meloxicam made him feel lethargic so he stopped it after a couple of days. He seems to localize his pain in the soft tissues of the distal one third of the hamstrings extending over the popliteal area to the proximal third of the calf.  Denies any shooting/sharp jabbing pain, denies paresthesias, denies absolute weakness but admits he cannot lift that leg or walk as well due to his pain. He does not have any significant back pain.   Past Medical History:  Diagnosis Date   Adenomatous colon polyp    Dr. Laural Golden (28 polyps on 1st endo, 3 on 2nd endo a year later.  8 on colonoscopy 02/26/14--recall 3 yrs.  Multiple adenomatous polyps on  09/20/17 TCS--recall 1 yr.. 11/2018 +aden pol->recall 3 yrs.   Aortic insufficiency    Mild to moderate 2013   BPH (benign prostatic hyperplasia)    Dr. Lum Babe (pt is asymptomatic)   Carotid artery disease ([place])    Left CEA   Chronic renal insufficiency, stage II (mild)    DDD (degenerative disc disease), lumbar 02/2013   with spondylosis and moderate spinal stenosis+ left L5 and S1 nerve root impingement.  04/2022 left S1 root impingement   Ectatic abdominal aorta (HCC) 08/08/2016   2.8 cm at widest point: repeat aortic u/s 08/2021.   Essential tremor    Fatty liver 2006   Noted on abd u/s and renal u/s 2006/2007   Frequent PVCs    no meds as of 04/2022 per cardiology   History of hyperkalemia 02/2017   mild; recommended pt cut his ARB in half.   History of MI (myocardial infarction)    Large fixed inferior defect on myocardial perfusion imaging 03/2017.   Hyperlipidemia    Dr. Percival Spanish started statin 03/2017.     Hypertension    PAD (peripheral artery disease) ([place])    carotids: carotid endarterectomy 2010.  Dr. Percival Spanish started pt on statin for CV risk reduction 03/2017   Plantar wart of left foot 12/21/2012   Prediabetes 2019   A1c 5.9%   Solitary pulmonary nodule 03/14/2013   79m RUL 03/08/13--stable on repeat 07/2013, 03/2015, and 01/2016.  No additional imaging is required.  Tobacco dependence    ongoing as of 02/2016   Vitamin B12 deficiency without anemia 2016   Intrinsic factor NEG: home vit B12 injections started 11/17/14    Past Surgical History:  Procedure Laterality Date   AMPUTATION FINGER Left 05/16/2019   Procedure: Revision Amutation of Left  fourth finger;  Surgeon: Roseanne Kaufman, MD;  Location: [place];  Service: Orthopedics;  Laterality: Left;   APPENDECTOMY  remote   Arm surgery  03/2009   Left arm cyst/Lipoma   CARDIOVASCULAR STRESS TEST  03/2017   Low risk study (likely artifact seen, but no reversible ischemia, EF 59%, no wall motion abnormality.   carotid  dopplers  02/2015; 02/2016;12/2017;06/2019   2017:  R ICA 1-39%, L ICA 40-59%: no change from 2016. 03/2017 rpt 40-59% bilat ICA stenosis. 12/2017 1-39% bilat ICA stenosis.  06/2019->same. Rpt 18 mo.   CAROTID ENDARTERECTOMY  11/25/2008   Left     ICA   COLONOSCOPY N/A 02/26/2014   Recall 3 yrs: (+ polypectomy--tubular adenoma) Procedure: COLONOSCOPY;  Surgeon: Rogene Houston, MD;  [facility];  Service: Endoscopy;  Laterality: N/A;  830   COLONOSCOPY N/A 09/20/2017   Multiple adenomatous polyps: recall 1 yr.  Procedure: COLONOSCOPY;  Surgeon: Rogene Houston, MD;  [facility];  Service: Endoscopy;  Laterality: N/A;  730   COLONOSCOPY N/A 11/21/2018   Procedure: COLONOSCOPY;  Surgeon: Rogene Houston, MD;  [facility];  Service: Endoscopy;  Laterality: N/A;  930   COLONOSCOPY W/ POLYPECTOMY  09/20/2017; 11/21/18   2019 Adenomatous polyp: recall 3-5 yrs (Dr. Laural Golden).  11/2018-adenomatous polyp->recall 3 yrs   I & D EXTREMITY Left 05/16/2019   Procedure: IRRIGATION AND DEBRIDEMENT OF LEFT INDEX, MIDDLE,FOURTH FINGERS;  Surgeon: Roseanne Kaufman, MD;  Location: [place];  Service: Orthopedics;  Laterality: Left;   NAILBED REPAIR Left 05/16/2019   Procedure: Reconstruction of bone and nail bed Left Index finger;  Surgeon: Roseanne Kaufman, MD;  Location: [place];  Service: Orthopedics;  Laterality: Left;   OPEN REDUCTION INTERNAL FIXATION (ORIF) HAND Left 05/16/2019   Procedure: Reconstruction and Open Reduction and Internal fixation and Rotation flap Left Middle finger;  Surgeon: Roseanne Kaufman, MD;  Location: [place];  Service: Orthopedics;  Laterality: Left;   POLYPECTOMY  09/20/2017   Procedure: POLYPECTOMY;  Surgeon: Rogene Houston, MD;  [facility];  Service: Endoscopy;;  Ascending colon, Transverse colon, Hepatic flexure, Cecum   POLYPECTOMY  11/21/2018   Procedure: POLYPECTOMY;  Surgeon: Rogene Houston, MD;  [facility];  Service:  Endoscopy;;   TONSILLECTOMY  remote   TONSILLECTOMY     TRANSTHORACIC ECHOCARDIOGRAM  03/2012   2013 --EF 55%, normal LV syst fxn, impaired diast relaxation, mild/mod aortic regurg.   UMBILICAL HERNIA REPAIR     ZIO PATCH  04/2022   mild brady, PACs, PVCs.  No meds    Outpatient Medications Prior to Visit  Medication Sig Dispense Refill   aspirin EC 81 MG tablet Take 1 tablet (81 mg total) by mouth at bedtime.     cyanocobalamin (,VITAMIN B-12,) 1000 MCG/ML injection Inject 1,000 mcg into the muscle every 30 (thirty) days.      ezetimibe (ZETIA) 10 MG tablet TAKE 1 TABLET(10 MG) BY MOUTH DAILY 90 tablet 1   Krill Oil (OMEGA-3) 500 MG CAPS      omeprazole (PRILOSEC) 40 MG capsule Take 1 capsule (40 mg total) by mouth daily. 30 capsule 3  Probiotic Product (PROBIOTIC-10 PO)      atorvastatin (LIPITOR) 80 MG tablet TAKE 1 TABLET(80 MG) BY MOUTH DAILY 90 tablet 1   meloxicam (MOBIC) 15 MG tablet Take 1 tablet (15 mg total) by mouth daily. 10 tablet 0   No facility-administered medications prior to visit.    Allergies  Allergen Reactions   Metoprolol Other (See Comments)    Fatigue    Other    Clonidine Derivatives Rash    ROS As per HPI  PE:    05/03/2022    1:14 PM 04/10/2022    3:22 PM 03/29/2022   10:54 AM  Vitals with BMI  Height '5\' 9"'$  '5\' 9"'$    Weight 142 lbs 143 lbs 13 oz   BMI 16.10 96.04   Systolic 540 981 191  Diastolic 60 60 68  Pulse 49 51 55     Physical Exam  Gen: Alert, well appearing.  Patient is oriented to person, place, time, and situation. AFFECT: pleasant, lucid thought and speech. Low back and SI joints nontender. Lower leg flexors and extensors intact 5 out of 5 bilaterally. Left leg pain is exacerbated some with resistance of hip extension and lower leg flexion. No palpable mass.  He has some vague discomfort when palpating the area of concern mentioned in HPI above.  Gluteal region symmetric and without swelling.  LABS:  Last CBC Lab  Results  Component Value Date   WBC 6.8 02/23/2022   HGB 13.8 02/23/2022   HCT 40.5 02/23/2022   MCV 86.6 02/23/2022   MCH 29.4 05/16/2019   RDW 14.3 02/23/2022   PLT 191.0 47/82/9562   Last metabolic panel Lab Results  Component Value Date   GLUCOSE 90 02/23/2022   NA 137 02/23/2022   K 4.4 02/23/2022   CL 98 02/23/2022   CO2 32 02/23/2022   BUN 15 02/23/2022   CREATININE 1.26 02/23/2022   GFRNONAA 51 (L) 05/16/2019   CALCIUM 9.6 02/23/2022   PROT 6.7 02/23/2022   ALBUMIN 4.0 02/23/2022   BILITOT 0.6 02/23/2022   ALKPHOS 89 02/23/2022   AST 12 02/23/2022   ALT 9 02/23/2022   ANIONGAP 10 05/16/2019   IMPRESSION AND PLAN:  Left leg pain, focal, posterior aspect.  Unknown etiology. Work-up thus far negative (see HPI above). I think what makes the most sense at this point is to have sports medicine see him--referral ordered today.  He did not tolerate meloxicam and we will avoid further NSAIDs at this time. For his pain we will try Vicodin 5-'3 25 1 '$ tab every 6 hours as needed, #30.  Therapeutic expectations and side effect profile of medication discussed today.  Patient's questions answered.  An After Visit Summary was printed and given to the patient.  FOLLOW UP: Return in about 2 weeks (around 05/17/2022) for f/u leg pain.  Signed:  Crissie Sickles, MD           05/03/2022

## 2022-05-04 ENCOUNTER — Ambulatory Visit: Payer: Medicare PPO | Admitting: Family Medicine

## 2022-05-08 ENCOUNTER — Ambulatory Visit: Payer: Medicare PPO | Admitting: Family Medicine

## 2022-05-08 ENCOUNTER — Encounter: Payer: Self-pay | Admitting: Family Medicine

## 2022-05-08 VITALS — BP 148/80 | Ht 69.0 in | Wt 142.0 lb

## 2022-05-08 DIAGNOSIS — M5416 Radiculopathy, lumbar region: Secondary | ICD-10-CM

## 2022-05-08 MED ORDER — GABAPENTIN 100 MG PO CAPS: 100.0000 mg | ORAL_CAPSULE | Freq: Three times a day (TID) | ORAL | 1 refills | Status: DC

## 2022-05-08 NOTE — Assessment & Plan Note (Signed)
Acutely occurring.  Symptoms were consistent with a radicular pattern.  Has several areas in the lumbar spine that nerve impingement can occur.  No significant changes at the knee or the hip. -Counseled on home exercise therapy and supportive care. -Green sport insoles.   - Could consider custom orthotics. -Gabapentin. -Could consider physical therapy or epidural.

## 2022-05-08 NOTE — Patient Instructions (Signed)
Nice to meet you Please try heat  Please try the exercise  Please start with one pill of gabapentin at night and then increase to 2 or 3 times daily   Please send me a message in MyChart with any questions or updates.  Please see me back in 4 weeks or as needed if better.   --Dr. Raeford Razor

## 2022-05-08 NOTE — Progress Notes (Signed)
Jeffrey [name] - 81 y.o. male MRN [id number]  Date of birth: 07-14-1941  SUBJECTIVE:  Including CC & ROS.  No chief complaint on file.   Jeffrey [name] is a 81 y.o. male that is presenting with left lower leg pain.  The pain has been ongoing for about 2 months.  The pain can be severe at times.  He experienced a syncopal episode about a week before his symptoms presented.  No history of similar pain.  Review of the office note from 7/31 shows a duplex was ordered. Review of the lower extremity venous duplex from 8/1 shows no DVT. Independent review of the left tibia/fibula x-ray from 8/3 shows no acute changes. Independent review of the left femur x-ray from 8/3 shows no acute changes. Independent review of the lumbar spine x-ray from 8/3 shows degenerative disc changes most prominent at at L3-4 and L1-2. Independent review of the MRI lumbar spine from 8/17 shows broad disc bulging at L2 with canal stenosis as well as disc bulging with canal stenosis at L3-4, and severe facet left-sided arthropathy at L4-5.  Review of Systems See HPI   HISTORY: Past Medical, Surgical, Social, and Family History Reviewed & Updated per EMR.   Pertinent Historical Findings include:  Past Medical History:  Diagnosis Date   Adenomatous colon polyp    Dr. Laural Golden (28 polyps on 1st endo, 3 on 2nd endo a year later.  8 on colonoscopy 02/26/14--recall 3 yrs.  Multiple adenomatous polyps on 09/20/17 TCS--recall 1 yr.. 11/2018 +aden pol->recall 3 yrs.   Aortic insufficiency    Mild to moderate 2013   BPH (benign prostatic hyperplasia)    Dr. Lum Babe (pt is asymptomatic)   Carotid artery disease ([place])    Left CEA   Chronic renal insufficiency, stage II (mild)    DDD (degenerative disc disease), lumbar 02/2013   with spondylosis and moderate spinal stenosis+ left L5 and S1 nerve root impingement.  04/2022 left S1 root impingement   Ectatic abdominal aorta (HCC) 08/08/2016   2.8 cm at widest point: repeat aortic u/s  08/2021.   Essential tremor    Fatty liver 2006   Noted on abd u/s and renal u/s 2006/2007   Frequent PVCs    no meds as of 04/2022 per cardiology   History of hyperkalemia 02/2017   mild; recommended pt cut his ARB in half.   History of MI (myocardial infarction)    Large fixed inferior defect on myocardial perfusion imaging 03/2017.   Hyperlipidemia    Dr. Percival Spanish started statin 03/2017.     Hypertension    PAD (peripheral artery disease) ([place])    carotids: carotid endarterectomy 2010.  Dr. Percival Spanish started pt on statin for CV risk reduction 03/2017   Plantar wart of left foot 12/21/2012   Prediabetes 2019   A1c 5.9%   Solitary pulmonary nodule 03/14/2013   40m RUL 03/08/13--stable on repeat 07/2013, 03/2015, and 01/2016.  No additional imaging is required.   Tobacco dependence    ongoing as of 02/2016   Vitamin B12 deficiency without anemia 2016   Intrinsic factor NEG: home vit B12 injections started 11/17/14    Past Surgical History:  Procedure Laterality Date   AMPUTATION FINGER Left 05/16/2019   Procedure: Revision Amutation of Left  fourth finger;  Surgeon: GRoseanne Kaufman MD;  Location[place]  Service: Orthopedics;  Laterality: Left;   APPENDECTOMY  remote   Arm surgery  03/2009   Left arm cyst/Lipoma   CARDIOVASCULAR STRESS  TEST  03/2017   Low risk study (likely artifact seen, but no reversible ischemia, EF 59%, no wall motion abnormality.   carotid dopplers  02/2015; 02/2016;12/2017;06/2019   2017:  R ICA 1-39%, L ICA 40-59%: no change from 2016. 03/2017 rpt 40-59% bilat ICA stenosis. 12/2017 1-39% bilat ICA stenosis.  06/2019->same. Rpt 18 mo.   CAROTID ENDARTERECTOMY  11/25/2008   Left     ICA   COLONOSCOPY N/A 02/26/2014   Recall 3 yrs: (+ polypectomy--tubular adenoma) Procedure: COLONOSCOPY;  Surgeon: Rogene Houston, MD;  [facility];  Service: Endoscopy;  Laterality: N/A;  830   COLONOSCOPY N/A 09/20/2017   Multiple adenomatous polyps: recall 1 yr.   Procedure: COLONOSCOPY;  Surgeon: Rogene Houston, MD;  [facility];  Service: Endoscopy;  Laterality: N/A;  730   COLONOSCOPY N/A 11/21/2018   Procedure: COLONOSCOPY;  Surgeon: Rogene Houston, MD;  [facility];  Service: Endoscopy;  Laterality: N/A;  930   COLONOSCOPY W/ POLYPECTOMY  09/20/2017; 11/21/18   2019 Adenomatous polyp: recall 3-5 yrs (Dr. Laural Golden).  11/2018-adenomatous polyp->recall 3 yrs   I & D EXTREMITY Left 05/16/2019   Procedure: IRRIGATION AND DEBRIDEMENT OF LEFT INDEX, MIDDLE,FOURTH FINGERS;  Surgeon: Roseanne Kaufman, MD;  Location: [place];  Service: Orthopedics;  Laterality: Left;   NAILBED REPAIR Left 05/16/2019   Procedure: Reconstruction of bone and nail bed Left Index finger;  Surgeon: Roseanne Kaufman, MD;  Location: [place];  Service: Orthopedics;  Laterality: Left;   OPEN REDUCTION INTERNAL FIXATION (ORIF) HAND Left 05/16/2019   Procedure: Reconstruction and Open Reduction and Internal fixation and Rotation flap Left Middle finger;  Surgeon: Roseanne Kaufman, MD;  Location: [place];  Service: Orthopedics;  Laterality: Left;   POLYPECTOMY  09/20/2017   Procedure: POLYPECTOMY;  Surgeon: Rogene Houston, MD;  [facility];  Service: Endoscopy;;  Ascending colon, Transverse colon, Hepatic flexure, Cecum   POLYPECTOMY  11/21/2018   Procedure: POLYPECTOMY;  Surgeon: Rogene Houston, MD;  [facility];  Service: Endoscopy;;   TONSILLECTOMY  remote   TONSILLECTOMY     TRANSTHORACIC ECHOCARDIOGRAM  03/2012   2013 --EF 55%, normal LV syst fxn, impaired diast relaxation, mild/mod aortic regurg.   UMBILICAL HERNIA REPAIR     ZIO PATCH  04/2022   mild brady, PACs, PVCs.  No meds     PHYSICAL EXAM:  VS: BP (!) 148/80 (BP Location: Left Arm, Patient Position: Sitting)   Ht '5\' 9"'$  (1.753 m)   Wt 142 lb (64.4 kg)   BMI 20.97 kg/m  Physical Exam Gen: NAD, alert, cooperative with exam, well-appearing MSK:  Neurovascularly intact        ASSESSMENT & PLAN:   Lumbar radiculopathy Acutely occurring.  Symptoms were consistent with a radicular pattern.  Has several areas in the lumbar spine that nerve impingement can occur.  No significant changes at the knee or the hip. -Counseled on home exercise therapy and supportive care. -Green sport insoles.   - Could consider custom orthotics. -Gabapentin. -Could consider physical therapy or epidural.

## 2022-05-17 ENCOUNTER — Ambulatory Visit: Payer: Medicare PPO | Admitting: Family Medicine

## 2022-05-17 ENCOUNTER — Telehealth: Payer: Self-pay | Admitting: *Deleted

## 2022-05-17 DIAGNOSIS — M5416 Radiculopathy, lumbar region: Secondary | ICD-10-CM

## 2022-05-17 NOTE — Telephone Encounter (Signed)
-----   Message from Elberta Spaniel sent at 05/17/2022 10:56 AM EDT ----- Regarding: Pt request order for Epidural Pt's daughter called says he wants to go forward with having the Epidural as suggested at his LOV  05/08/22

## 2022-05-17 NOTE — Telephone Encounter (Signed)
Epidural order placed.

## 2022-05-25 DIAGNOSIS — M5416 Radiculopathy, lumbar region: Secondary | ICD-10-CM | POA: Diagnosis not present

## 2022-05-26 ENCOUNTER — Inpatient Hospital Stay: Admission: RE | Admit: 2022-05-26 | Payer: Medicare PPO | Source: Ambulatory Visit

## 2022-05-30 ENCOUNTER — Ambulatory Visit
Admission: RE | Admit: 2022-05-30 | Discharge: 2022-05-30 | Disposition: A | Discharge status: Home / Self Care | Payer: Medicare PPO | Source: Ambulatory Visit | Attending: Family Medicine | Admitting: Family Medicine

## 2022-05-30 DIAGNOSIS — M4726 Other spondylosis with radiculopathy, lumbar region: Secondary | ICD-10-CM | POA: Diagnosis not present

## 2022-05-30 DIAGNOSIS — M5416 Radiculopathy, lumbar region: Secondary | ICD-10-CM

## 2022-05-30 MED ORDER — IOPAMIDOL (ISOVUE-M 200) INJECTION 41%
1.0000 mL | Freq: Once | INTRAMUSCULAR | Status: AC
  Administered 2022-05-30: 1 mL via EPIDURAL

## 2022-05-30 MED ORDER — METHYLPREDNISOLONE ACETATE 40 MG/ML INJ SUSP (RADIOLOG
80.0000 mg | Freq: Once | INTRAMUSCULAR | Status: AC
  Administered 2022-05-30: 80 mg via EPIDURAL

## 2022-05-30 NOTE — Discharge Instructions (Signed)

## 2022-06-06 ENCOUNTER — Other Ambulatory Visit: Payer: Self-pay

## 2022-06-06 NOTE — Telephone Encounter (Signed)
RF request for felodipine LOV:05/03/22 Next ov: f/u 2 weeks  Last written: 11/10/21-03/29/22, pt reported not taking on 03/02/22  Please fill, if appropriate. Med pending

## 2022-06-07 DIAGNOSIS — M545 Low back pain, unspecified: Secondary | ICD-10-CM | POA: Diagnosis not present

## 2022-06-07 DIAGNOSIS — M5416 Radiculopathy, lumbar region: Secondary | ICD-10-CM | POA: Diagnosis not present

## 2022-06-07 NOTE — Telephone Encounter (Signed)
Last few visits patient has not been on this medication and his blood pressure has still been normal.  He is okay to remain off of it.  I will decline the refill.

## 2022-06-09 DIAGNOSIS — M545 Low back pain, unspecified: Secondary | ICD-10-CM | POA: Diagnosis not present

## 2022-06-09 DIAGNOSIS — M5416 Radiculopathy, lumbar region: Secondary | ICD-10-CM | POA: Diagnosis not present

## 2022-06-12 ENCOUNTER — Other Ambulatory Visit: Payer: Self-pay

## 2022-06-12 DIAGNOSIS — M5416 Radiculopathy, lumbar region: Secondary | ICD-10-CM | POA: Diagnosis not present

## 2022-06-12 DIAGNOSIS — M545 Low back pain, unspecified: Secondary | ICD-10-CM | POA: Diagnosis not present

## 2022-06-12 MED ORDER — EZETIMIBE 10 MG PO TABS: ORAL_TABLET | ORAL | 0 refills | Status: DC

## 2022-06-16 DIAGNOSIS — M545 Low back pain, unspecified: Secondary | ICD-10-CM | POA: Diagnosis not present

## 2022-06-16 DIAGNOSIS — M5416 Radiculopathy, lumbar region: Secondary | ICD-10-CM | POA: Diagnosis not present

## 2022-06-20 DIAGNOSIS — M545 Low back pain, unspecified: Secondary | ICD-10-CM | POA: Diagnosis not present

## 2022-06-20 DIAGNOSIS — M5416 Radiculopathy, lumbar region: Secondary | ICD-10-CM | POA: Diagnosis not present

## 2022-06-23 DIAGNOSIS — M5416 Radiculopathy, lumbar region: Secondary | ICD-10-CM | POA: Diagnosis not present

## 2022-06-23 DIAGNOSIS — M545 Low back pain, unspecified: Secondary | ICD-10-CM | POA: Diagnosis not present

## 2022-06-28 DIAGNOSIS — M545 Low back pain, unspecified: Secondary | ICD-10-CM | POA: Diagnosis not present

## 2022-06-28 DIAGNOSIS — M5416 Radiculopathy, lumbar region: Secondary | ICD-10-CM | POA: Diagnosis not present

## 2022-06-30 DIAGNOSIS — M5416 Radiculopathy, lumbar region: Secondary | ICD-10-CM | POA: Diagnosis not present

## 2022-06-30 DIAGNOSIS — M47896 Other spondylosis, lumbar region: Secondary | ICD-10-CM | POA: Diagnosis not present

## 2022-07-06 ENCOUNTER — Ambulatory Visit: Payer: Medicare PPO | Admitting: Family Medicine

## 2022-07-06 ENCOUNTER — Encounter: Payer: Self-pay | Admitting: Family Medicine

## 2022-07-06 VITALS — BP 113/62 | HR 56 | Temp 98.0°F | Ht 69.0 in | Wt 139.2 lb

## 2022-07-06 DIAGNOSIS — N1831 Chronic kidney disease, stage 3a: Secondary | ICD-10-CM | POA: Diagnosis not present

## 2022-07-06 DIAGNOSIS — R7303 Prediabetes: Secondary | ICD-10-CM

## 2022-07-06 DIAGNOSIS — I10 Essential (primary) hypertension: Secondary | ICD-10-CM | POA: Diagnosis not present

## 2022-07-06 DIAGNOSIS — Z23 Encounter for immunization: Secondary | ICD-10-CM | POA: Diagnosis not present

## 2022-07-06 DIAGNOSIS — E538 Deficiency of other specified B group vitamins: Secondary | ICD-10-CM | POA: Diagnosis not present

## 2022-07-06 DIAGNOSIS — M79605 Pain in left leg: Secondary | ICD-10-CM | POA: Diagnosis not present

## 2022-07-06 DIAGNOSIS — M5416 Radiculopathy, lumbar region: Secondary | ICD-10-CM

## 2022-07-06 LAB — POCT GLYCOSYLATED HEMOGLOBIN (HGB A1C)
HbA1c POC (<> result, manual entry): 5.9 % (ref 4.0–5.6)
HbA1c, POC (controlled diabetic range): 5.9 % (ref 0.0–7.0)
HbA1c, POC (prediabetic range): 5.9 % (ref 5.7–6.4)
Hemoglobin A1C: 5.9 % — AB (ref 4.0–5.6)

## 2022-07-06 NOTE — Progress Notes (Signed)
OFFICE VISIT  07/06/2022  CC:  Chief Complaint  Patient presents with   Hypertension   Chronic Kidney Disease   Patient is a 81 y.o. male who presents for f/u prediabetes, CRI III, and L leg pain/radiculopathy.  INTERIM HX: States he is doing fine other than constant left leg pain--located focally from distal one third of thigh over the popliteal area to the proximal third of the calf.  PT has not helped.  A lumbar epidural injection did not help. Gabapentin has not helped.   Most recent B12 inj was 08/31/21.  ROS as above, plus--> no fevers, no CP, no SOB, no wheezing, no cough, no dizziness, no HAs, no rashes, no melena/hematochezia.  No polyuria or polydipsia.    No focal weakness, paresthesias.  Chronic right hand greater than left hand tremor, unchanged.  No acute vision or hearing abnormalities.  No dysuria or unusual/new urinary urgency or frequency.  No recent changes in lower legs. No n/v/d or abd pain.  No palpitations.    Past Medical History:  Diagnosis Date   Adenomatous colon polyp    Dr. Laural Golden (28 polyps on 1st endo, 3 on 2nd endo a year later.  8 on colonoscopy 02/26/14--recall 3 yrs.  Multiple adenomatous polyps on 09/20/17 TCS--recall 1 yr.. 11/2018 +aden pol->recall 3 yrs.   Aortic insufficiency    Mild to moderate 2013   BPH (benign prostatic hyperplasia)    Dr. Lum Babe (pt is asymptomatic)   Carotid artery disease ([place])    Left CEA   Chronic renal insufficiency, stage II (mild)    DDD (degenerative disc disease), lumbar 02/2013   with spondylosis and moderate spinal stenosis+ left L5 and S1 nerve root impingement.  04/2022 left S1 root impingement   Ectatic abdominal aorta (HCC) 08/08/2016   2.8 cm at widest point: repeat aortic u/s 08/2021.   Essential tremor    Fatty liver 2006   Noted on abd u/s and renal u/s 2006/2007   Frequent PVCs    no meds as of 04/2022 per cardiology   History of hyperkalemia 02/2017   mild; recommended pt cut his ARB in half.    History of MI (myocardial infarction)    Large fixed inferior defect on myocardial perfusion imaging 03/2017.   Hyperlipidemia    Dr. Percival Spanish started statin 03/2017.     Hypertension    PAD (peripheral artery disease) ([place])    carotids: carotid endarterectomy 2010.  Dr. Percival Spanish started pt on statin for CV risk reduction 03/2017   Plantar wart of left foot 12/21/2012   Prediabetes 2019   A1c 5.9%   Solitary pulmonary nodule 03/14/2013   17m RUL 03/08/13--stable on repeat 07/2013, 03/2015, and 01/2016.  No additional imaging is required.   Tobacco dependence    ongoing as of 02/2016   Vitamin B12 deficiency without anemia 2016   Intrinsic factor NEG: home vit B12 injections started 11/17/14    Past Surgical History:  Procedure Laterality Date   AMPUTATION FINGER Left 05/16/2019   Procedure: Revision Amutation of Left  fourth finger;  Surgeon: GRoseanne Kaufman MD;  [facility] [facility]  Service: Orthopedics;  Laterality: Left;   APPENDECTOMY  remote   Arm surgery  03/2009   Left arm cyst/Lipoma   CARDIOVASCULAR STRESS TEST  03/2017   Low risk study (likely artifact seen, but no reversible ischemia, EF 59%, no wall motion abnormality.   carotid dopplers  02/2015; 02/2016;12/2017;06/2019   2017:  R ICA 1-39%, L ICA 40-59%: no change  from 2016. 03/2017 rpt 40-59% bilat ICA stenosis. 12/2017 1-39% bilat ICA stenosis.  06/2019->same. Rpt 18 mo.   CAROTID ENDARTERECTOMY  11/25/2008   Left     ICA   COLONOSCOPY N/A 02/26/2014   Recall 3 yrs: (+ polypectomy--tubular adenoma) Procedure: COLONOSCOPY;  Surgeon: Rogene Houston, MD;  [facility];  Service: Endoscopy;  Laterality: N/A;  830   COLONOSCOPY N/A 09/20/2017   Multiple adenomatous polyps: recall 1 yr.  Procedure: COLONOSCOPY;  Surgeon: Rogene Houston, MD;  [facility];  Service: Endoscopy;  Laterality: N/A;  730   COLONOSCOPY N/A 11/21/2018   Procedure: COLONOSCOPY;  Surgeon: Rogene Houston, MD;  [facility];   Service: Endoscopy;  Laterality: N/A;  930   COLONOSCOPY W/ POLYPECTOMY  09/20/2017; 11/21/18   2019 Adenomatous polyp: recall 3-5 yrs (Dr. Laural Golden).  11/2018-adenomatous polyp->recall 3 yrs   I & D EXTREMITY Left 05/16/2019   Procedure: IRRIGATION AND DEBRIDEMENT OF LEFT INDEX, MIDDLE,FOURTH FINGERS;  Surgeon: Roseanne Kaufman, MD;  Location: [facility];  Service: Orthopedics;  Laterality: Left;   NAILBED REPAIR Left 05/16/2019   Procedure: Reconstruction of bone and nail bed Left Index finger;  Surgeon: Roseanne Kaufman, MD;  Location: [place];  Service: Orthopedics;  Laterality: Left;   OPEN REDUCTION INTERNAL FIXATION (ORIF) HAND Left 05/16/2019   Procedure: Reconstruction and Open Reduction and Internal fixation and Rotation flap Left Middle finger;  Surgeon: Roseanne Kaufman, MD;  Location: [facility];  Service: Orthopedics;  Laterality: Left;   POLYPECTOMY  09/20/2017   Procedure: POLYPECTOMY;  Surgeon: Rogene Houston, MD;  [facility];  Service: Endoscopy;;  Ascending colon, Transverse colon, Hepatic flexure, Cecum   POLYPECTOMY  11/21/2018   Procedure: POLYPECTOMY;  Surgeon: Rogene Houston, MD;  [facility];  Service: Endoscopy;;   TONSILLECTOMY  remote   TONSILLECTOMY     TRANSTHORACIC ECHOCARDIOGRAM  03/2012   2013 --EF 55%, normal LV syst fxn, impaired diast relaxation, mild/mod aortic regurg.   UMBILICAL HERNIA REPAIR     ZIO PATCH  04/2022   mild brady, PACs, PVCs.  No meds    Outpatient Medications Prior to Visit  Medication Sig Dispense Refill   aspirin EC 81 MG tablet Take 1 tablet (81 mg total) by mouth at bedtime.     atorvastatin (LIPITOR) 80 MG tablet TAKE 1 TABLET(80 MG) BY MOUTH DAILY 90 tablet 3   cyanocobalamin (,VITAMIN B-12,) 1000 MCG/ML injection Inject 1,000 mcg into the muscle every 30 (thirty) days.      ezetimibe (ZETIA) 10 MG tablet TAKE 1 TABLET(10 MG) BY MOUTH DAILY 30 tablet 0   gabapentin (NEURONTIN) 100 MG capsule Take 1 capsule (100 mg  total) by mouth 3 (three) times daily. 60 capsule 1   HYDROcodone-acetaminophen (NORCO/VICODIN) 5-325 MG tablet 1 tab po q6h prn pain 30 tablet 0   Krill Oil (OMEGA-3) 500 MG CAPS      omeprazole (PRILOSEC) 40 MG capsule Take 1 capsule (40 mg total) by mouth daily. 30 capsule 3   Probiotic Product (PROBIOTIC-10 PO)      No facility-administered medications prior to visit.    Allergies  Allergen Reactions   Metoprolol Other (See Comments)    Fatigue    Other    Clonidine Derivatives Rash    ROS As per HPI  PE:    07/06/2022    2:35 PM 05/30/2022   10:17 AM 05/08/2022    8:39 AM  Vitals with  BMI  Height '5\' 9"'$   '5\' 9"'$   Weight 139 lbs 3 oz  142 lbs  BMI 40.08  67.61  Systolic 950 932 671  Diastolic 62 60 80  Pulse 56 60    Physical Exam  Gen: Alert, well appearing.  Patient is oriented to person, place, time, and situation. AFFECT: pleasant, lucid thought and speech. CV: regularly irreg, rate 24P, soft systolic murmur. No diastolic murmur Chest is clear, no wheezing or rales. Normal symmetric air entry throughout both lung fields. No chest wall deformities or tenderness. EXT: no clubbing or cyanosis.  no edema.    LABS:  Last CBC Lab Results  Component Value Date   WBC 6.8 02/23/2022   HGB 13.8 02/23/2022   HCT 40.5 02/23/2022   MCV 86.6 02/23/2022   MCH 29.4 05/16/2019   RDW 14.3 02/23/2022   PLT 191.0 80/99/8338   Last metabolic panel Lab Results  Component Value Date   GLUCOSE 90 02/23/2022   NA 137 02/23/2022   K 4.4 02/23/2022   CL 98 02/23/2022   CO2 32 02/23/2022   BUN 15 02/23/2022   CREATININE 1.26 02/23/2022   GFRNONAA 51 (L) 05/16/2019   CALCIUM 9.6 02/23/2022   PROT 6.7 02/23/2022   ALBUMIN 4.0 02/23/2022   BILITOT 0.6 02/23/2022   ALKPHOS 89 02/23/2022   AST 12 02/23/2022   ALT 9 02/23/2022   ANIONGAP 10 05/16/2019   Last lipids Lab Results  Component Value Date   CHOL 139 11/10/2021   HDL 45.60 11/10/2021   LDLCALC 71 11/10/2021    TRIG 109.0 11/10/2021   CHOLHDL 3 11/10/2021   Last hemoglobin A1c Lab Results  Component Value Date   HGBA1C 5.9 (A) 07/06/2022   HGBA1C 5.9 07/06/2022   HGBA1C 5.9 07/06/2022   HGBA1C 5.9 07/06/2022   Last vitamin B12 and Folate Lab Results  Component Value Date   VITAMINB12 >1504 (H) 11/10/2021   IMPRESSION AND PLAN:  #1 prediabetes. Hemoglobin A1c today is 5.9%. Encouraged him to continue to limit carbohydrates and maximize activity level.  2.  Chronic renal insufficiency stage III. He avoids NSAIDs. He has never really hydrated well. Bmet today.  #3 vitamin B12 deficiency. His last injection was about 10 months ago. Vitamin B12 level today, restart injections if level less than 300.  #4 left leg pain, lumbar radiculopathy. No improvement with a significant course of PT and trial of lumbar epidural injection. He has follow-up with an orthopedic surgeon early next month. Per their instructions he will continue to slowly titrate gabapentin up  An After Visit Summary was printed and given to the patient.  FOLLOW UP: Return in about 6 months (around 01/05/2023) for routine chronic illness f/u.  Signed:  Crissie Sickles, MD           07/06/2022

## 2022-07-07 LAB — BASIC METABOLIC PANEL
BUN: 14 mg/dL (ref 6–23)
CO2: 30 mEq/L (ref 19–32)
Calcium: 9.6 mg/dL (ref 8.4–10.5)
Chloride: 103 mEq/L (ref 96–112)
Creatinine, Ser: 1.29 mg/dL (ref 0.40–1.50)
GFR: 52.83 mL/min — ABNORMAL LOW (ref >60.00)
Glucose, Bld: 95 mg/dL (ref 70–99)
Potassium: 4.6 mEq/L (ref 3.5–5.1)
Sodium: 139 mEq/L (ref 135–145)

## 2022-07-07 LAB — VITAMIN B12: Vitamin B-12: 455 pg/mL (ref 211–911)

## 2022-07-10 ENCOUNTER — Other Ambulatory Visit: Payer: Self-pay | Admitting: Family Medicine

## 2022-07-10 ENCOUNTER — Encounter: Payer: Self-pay | Admitting: Family Medicine

## 2022-07-11 ENCOUNTER — Other Ambulatory Visit: Payer: Self-pay

## 2022-07-11 MED ORDER — EZETIMIBE 10 MG PO TABS: ORAL_TABLET | ORAL | 1 refills | Status: DC

## 2022-07-18 DIAGNOSIS — M5451 Vertebrogenic low back pain: Secondary | ICD-10-CM | POA: Diagnosis not present

## 2022-07-19 ENCOUNTER — Telehealth: Payer: Self-pay | Admitting: Family Medicine

## 2022-07-19 DIAGNOSIS — M5416 Radiculopathy, lumbar region: Secondary | ICD-10-CM

## 2022-07-19 DIAGNOSIS — M79605 Pain in left leg: Secondary | ICD-10-CM

## 2022-07-19 NOTE — Telephone Encounter (Signed)
Pts daughter called and is requesting that her father get a new referral to a neurologist. He would like to be referred to this location   2023 [place],  [email or web address] [address] [place], [place], [place] [address]   [phone]

## 2022-07-19 NOTE — Telephone Encounter (Signed)
Last neurology referral placed on 11/10/21 to [facility] [place].   New referral pending

## 2022-07-19 NOTE — Telephone Encounter (Signed)
LM for pt regarding referral.

## 2022-07-19 NOTE — Telephone Encounter (Signed)
Okay, referral completed

## 2022-07-20 NOTE — Telephone Encounter (Signed)
done

## 2022-07-26 DIAGNOSIS — M5416 Radiculopathy, lumbar region: Secondary | ICD-10-CM | POA: Diagnosis not present

## 2022-08-21 DIAGNOSIS — M5416 Radiculopathy, lumbar region: Secondary | ICD-10-CM | POA: Diagnosis not present

## 2022-10-30 ENCOUNTER — Telehealth: Payer: Self-pay

## 2022-10-30 NOTE — Telephone Encounter (Signed)
LVM for pt to call back in regards to scheduling AWV with our health coach.   10/30/2022@currenttime$ @

## 2022-11-07 ENCOUNTER — Ambulatory Visit: Payer: Medicare PPO | Attending: Cardiology | Admitting: Cardiology

## 2022-11-07 ENCOUNTER — Encounter: Payer: Self-pay | Admitting: Cardiology

## 2022-11-07 VITALS — BP 150/60 | HR 48 | Ht 69.0 in | Wt 134.8 lb

## 2022-11-07 DIAGNOSIS — I6523 Occlusion and stenosis of bilateral carotid arteries: Secondary | ICD-10-CM | POA: Diagnosis not present

## 2022-11-07 DIAGNOSIS — I493 Ventricular premature depolarization: Secondary | ICD-10-CM | POA: Diagnosis not present

## 2022-11-07 DIAGNOSIS — I10 Essential (primary) hypertension: Secondary | ICD-10-CM | POA: Diagnosis not present

## 2022-11-07 DIAGNOSIS — I259 Chronic ischemic heart disease, unspecified: Secondary | ICD-10-CM | POA: Diagnosis not present

## 2022-11-07 NOTE — Patient Instructions (Addendum)
Medication Instructions:  Your physician recommends that you continue on your current medications as directed. Please refer to the Current Medication list given to you today.  Labwork: none  Testing/Procedures: Your physician has requested that you have a carotid duplex in 6 months just before your next visit. This test is an ultrasound of the carotid arteries in your neck. It looks at blood flow through these arteries that supply the brain with blood. Allow one hour for this exam. There are no restrictions or special instructions.  Follow-Up: Your physician recommends that you schedule a follow-up appointment in: 6 months  Any Other Special Instructions Will Be Listed Below (If Applicable).  If you need a refill on your cardiac medications before your next appointment, please call your pharmacy.

## 2022-11-07 NOTE — Progress Notes (Signed)
    Cardiology Office Note  Date: 11/07/2022   ID: Jeffrey [name], DOB October 08, 1940, MRN [id number]  History of Present Illness: Jeffrey [name] is a 82 y.o. male last seen in July 2023.  He is here today with his wife for a follow-up visit.  Reports no sudden dizziness or syncope since last encounter.  No exertional chest pain.  He has gradually slowed down in terms of stamina. His wife states that he stopped smoking about 5 days ago.  I reviewed his medications.  He reports compliance with therapy and will have follow-up lab work per PCP for review of lipids.  He is on both Lipitor and Zetia, also low-dose aspirin.  We discussed getting follow-up carotid Dopplers for his next visit.  Interval cardiac testing did demonstrate frequent PVCs, approximately 11% burden, but overall asymptomatic and not necessarily associated with symptoms or evidence of cardiomyopathy.  I have held off starting him on a beta-blocker.  Physical Exam: VS:  BP (!) 150/60   Pulse (!) 48   Ht 5' 9"$  (1.753 m)   Wt 134 lb 12.8 oz (61.1 kg)   SpO2 99%   BMI 19.91 kg/m , BMI Body mass index is 19.91 kg/m.  Wt Readings from Last 3 Encounters:  11/07/22 134 lb 12.8 oz (61.1 kg)  07/06/22 139 lb 3.2 oz (63.1 kg)  05/08/22 142 lb (64.4 kg)    General: Patient appears comfortable at rest. HEENT: Conjunctiva and lids normal. Neck: Supple, no elevated JVP, right carotid bruit. Lungs: Clear to auscultation, nonlabored breathing at rest. Cardiac: Regular rate and rhythm with ectopy, no S3, 1/6 systolic murmur. Extremities: No pitting edema.  ECG:  An ECG dated 03/29/2022 was personally reviewed today and demonstrated:  Sinus rhythm with ventricular bigeminy.  Labwork: 11/10/2021: TSH 3.41 02/23/2022: ALT 9; AST 12; Hemoglobin 13.8; Platelets 191.0 07/06/2022: BUN 14; Creatinine, Ser 1.29; Potassium 4.6; Sodium 139     Component Value Date/Time   CHOL 139 11/10/2021 1005   TRIG 109.0 11/10/2021 1005   HDL 45.60  11/10/2021 1005   CHOLHDL 3 11/10/2021 1005   VLDL 21.8 11/10/2021 1005   LDLCALC 71 11/10/2021 1005   Assessment and Plan:  1.  Ischemic heart disease based on Myoview from 2018 showing large region of inferior/inferoseptal scar.  Follow-up echocardiogram in August 2023 showed LVEF 70 to 75% without regional wall motion abnormalities.  He does not report any active angina with current level of activity.  Continue aspirin, Lipitor, and Zetia.  2.  Mild to moderate aortic regurgitation with aortic valve sclerosis at baseline.  He is asymptomatic.  3.  Frequent PVCs, approximately 11% burden by cardiac monitor in August 2023.  No definite association with symptoms or evidence of cardiomyopathy.  Holding off on beta-blocker for now.  4.  Asymptomatic carotid artery disease, 50 to 69% RICA stenosis and less than [id number] LICA stenosis as of June 2023.  Continue Lipitor and Zetia along with aspirin.  Recheck carotid Dopplers for next visit in 6 months.  5.  History of syncope, possibly related to relative hypotension or orthostasis and improved after discontinuation of both Plendil and HCTZ by PCP.  Blood pressure elevated today.  I have asked them to check measurements periodically at home.  Disposition:  Follow up  6 months.  Signed, Satira Sark, MD, [facility]

## 2022-11-21 ENCOUNTER — Encounter: Payer: Self-pay | Admitting: Family Medicine

## 2022-11-21 ENCOUNTER — Ambulatory Visit: Payer: Medicare PPO | Admitting: Family Medicine

## 2022-11-21 VITALS — BP 149/68 | HR 84 | Temp 97.6°F | Ht 69.0 in | Wt 135.6 lb

## 2022-11-21 DIAGNOSIS — H6123 Impacted cerumen, bilateral: Secondary | ICD-10-CM | POA: Diagnosis not present

## 2022-11-21 DIAGNOSIS — R400 Somnolence: Secondary | ICD-10-CM

## 2022-11-21 DIAGNOSIS — R2681 Unsteadiness on feet: Secondary | ICD-10-CM | POA: Diagnosis not present

## 2022-11-21 DIAGNOSIS — R7303 Prediabetes: Secondary | ICD-10-CM

## 2022-11-21 DIAGNOSIS — R29898 Other symptoms and signs involving the musculoskeletal system: Secondary | ICD-10-CM

## 2022-11-21 DIAGNOSIS — N183 Chronic kidney disease, stage 3 unspecified: Secondary | ICD-10-CM | POA: Diagnosis not present

## 2022-11-21 DIAGNOSIS — R251 Tremor, unspecified: Secondary | ICD-10-CM | POA: Diagnosis not present

## 2022-11-21 DIAGNOSIS — I10 Essential (primary) hypertension: Secondary | ICD-10-CM

## 2022-11-21 LAB — BASIC METABOLIC PANEL
BUN: 17 mg/dL (ref 6–23)
CO2: 29 mEq/L (ref 19–32)
Calcium: 10.1 mg/dL (ref 8.4–10.5)
Chloride: 100 mEq/L (ref 96–112)
Creatinine, Ser: 1.21 mg/dL (ref 0.40–1.50)
GFR: 56.9 mL/min — ABNORMAL LOW (ref >60.00)
Glucose, Bld: 92 mg/dL (ref 70–99)
Potassium: 5 mEq/L (ref 3.5–5.1)
Sodium: 137 mEq/L (ref 135–145)

## 2022-11-21 LAB — POCT GLYCOSYLATED HEMOGLOBIN (HGB A1C)
HbA1c POC (<> result, manual entry): 5.6 % (ref 4.0–5.6)
HbA1c, POC (controlled diabetic range): 5.6 % (ref 0.0–7.0)
HbA1c, POC (prediabetic range): 5.6 % — AB (ref 5.7–6.4)
Hemoglobin A1C: 5.6 % (ref 4.0–5.6)

## 2022-11-21 MED ORDER — OMEPRAZOLE 40 MG PO CPDR: DELAYED_RELEASE_CAPSULE | ORAL | 1 refills | Status: DC

## 2022-11-21 MED ORDER — EZETIMIBE 10 MG PO TABS: ORAL_TABLET | ORAL | 1 refills | Status: DC

## 2022-11-21 NOTE — Progress Notes (Addendum)
OFFICE VISIT  11/21/2022  CC:  Chief Complaint  Patient presents with   Medical Management of Chronic Issues    Pt is fasting    Patient is a 82 y.o. male who presents accompanied by his wife for 65-monthfollow-up prediabetes, chronic renal insufficiency stage III, hx HTN (hx orthostatic hyptension as well). A/P as of last visit: "#1 prediabetes. Hemoglobin A1c today is 5.9%. Encouraged him to continue to limit carbohydrates and maximize activity level.   2.  Chronic renal insufficiency stage III. He avoids NSAIDs. He has never really hydrated well. Bmet today.   #3 vitamin B12 deficiency. His last injection was about 10 months ago. Vitamin B12 level today, restart injections if level less than 300.   #4 left leg pain, lumbar radiculopathy. No improvement with a significant course of PT and trial of lumbar epidural injection. He has follow-up with an orthopedic surgeon early next month. Per their instructions he will continue to slowly titrate gabapentin up"  INTERIM HX: Fortunately, JWille Glaseris not in any pain.  Also, he is happy to report he has been 3 weeks without any cigarettes.  Struggles with progressive problems of tremor and inactivity. No motivation to do anything.  Denies depression or anxiety. Sleeps most of the day and night. These things have been gradually progressing over the last year or so. He is getting to where he cannot feed himself due to tremor, needs assistance dressing for same reason. Occasional periods of memory impairment but nothing significant. No periods of significant confusion or disorientation. Denies headache or dizziness. Feels like the right arm tremor is a little bit worse than the left.  Gets worse in the evening.  No focal weakness.  He does feel unsteady walking, which has been gradually worsening.  No shuffling.  No home blood pressure monitoring. He saw his cardiologist 2/27, BP was 150/60.  No med additions or changes made. He  apparently sometimes goes days without taking his medications.  ROS as above, plus--> no fevers, no CP, no SOB, no wheezing, no cough,no rashes, no melena/hematochezia.  No polyuria or polydipsia.  No myalgias or arthralgias.  No acute vision or hearing abnormalities.  No dysuria or unusual/new urinary urgency or frequency.  No recent changes in lower legs. No n/v/d or abd pain.  No palpitations.    Past Medical History:  Diagnosis Date   Adenomatous colon polyp    Dr. RLaural Golden(28 polyps on 1st endo, 3 on 2nd endo a year later.  8 on colonoscopy 02/26/14--recall 3 yrs.  Multiple adenomatous polyps on 09/20/17 TCS--recall 1 yr.. 11/2018 +aden pol->recall 3 yrs.   Aortic insufficiency    Mild to moderate 2013   BPH (benign prostatic hyperplasia)    Dr. JLum Babe(pt is asymptomatic)   Carotid artery disease (HCC)    Left CEA   Chronic renal insufficiency, stage 3 (moderate) (HCC)    DDD (degenerative disc disease), lumbar 02/2013   with spondylosis and moderate spinal stenosis+ left L5 and S1 nerve root impingement.  04/2022 left S1 root impingement   Ectatic abdominal aorta (HCC) 08/08/2016   2.8 cm at widest point: repeat aortic u/s 08/2021.   Essential tremor    Fatty liver 2006   Noted on abd u/s and renal u/s 2006/2007   Frequent PVCs    no meds as of 04/2022 per cardiology   History of hyperkalemia 02/2017   mild; recommended pt cut his ARB in half.   History of MI (myocardial infarction)  Large fixed inferior defect on myocardial perfusion imaging 03/2017.   Hyperlipidemia    Dr. Percival Spanish started statin 03/2017.     Hypertension    PAD (peripheral artery disease) ([place])    carotids: carotid endarterectomy 2010.  Dr. Percival Spanish started pt on statin for CV risk reduction 03/2017   Plantar wart of left foot 12/21/2012   Prediabetes 2019   A1c 5.9%   Solitary pulmonary nodule 03/14/2013   44m RUL 03/08/13--stable on repeat 07/2013, 03/2015, and 01/2016.  No additional imaging is required.    Tobacco dependence    ongoing as of 02/2016   Vitamin B12 deficiency without anemia 2016   Intrinsic factor NEG: home vit B12 injections started 11/17/14    Past Surgical History:  Procedure Laterality Date   AMPUTATION FINGER Left 05/16/2019   Procedure: Revision Amutation of Left  fourth finger;  Surgeon: GRoseanne Kaufman MD;  [facility] [facility]  Service: Orthopedics;  Laterality: Left;   APPENDECTOMY  remote   Arm surgery  03/2009   Left arm cyst/Lipoma   CARDIOVASCULAR STRESS TEST  03/2017   Low risk study (likely artifact seen, but no reversible ischemia, EF 59%, no wall motion abnormality.   carotid dopplers  02/2015; 02/2016;12/2017;06/2019   2017:  R ICA 1-39%, L ICA 40-59%: no change from 2016. 03/2017 rpt 40-59% bilat ICA stenosis. 12/2017 1-39% bilat ICA stenosis.  06/2019->same. Rpt 18 mo.   CAROTID ENDARTERECTOMY  11/25/2008   Left     ICA   COLONOSCOPY N/A 02/26/2014   Recall 3 yrs: (+ polypectomy--tubular adenoma) Procedure: COLONOSCOPY;  Surgeon: NRogene Houston MD;  [facility];  Service: Endoscopy;  Laterality: N/A;  830   COLONOSCOPY N/A 09/20/2017   Multiple adenomatous polyps: recall 1 yr.  Procedure: COLONOSCOPY;  Surgeon: RRogene Houston MD;  [facility];  Service: Endoscopy;  Laterality: N/A;  730   COLONOSCOPY N/A 11/21/2018   Procedure: COLONOSCOPY;  Surgeon: RRogene Houston MD;  [facility];  Service: Endoscopy;  Laterality: N/A;  930   COLONOSCOPY W/ POLYPECTOMY  09/20/2017; 11/21/18   2019 Adenomatous polyp: recall 3-5 yrs (Dr. RLaural Golden.  11/2018-adenomatous polyp->recall 3 yrs   I & D EXTREMITY Left 05/16/2019   Procedure: IRRIGATION AND DEBRIDEMENT OF LEFT INDEX, MIDDLE,FOURTH FINGERS;  Surgeon: GRoseanne Kaufman MD;  [place] [facility]  Service: Orthopedics;  Laterality: Left;   NAILBED REPAIR Left 05/16/2019   Procedure: Reconstruction of bone and nail bed Left Index finger;  Surgeon: GRoseanne Kaufman MD;  [facility]   Service: Orthopedics;  Laterality: Left;   OPEN REDUCTION INTERNAL FIXATION (ORIF) HAND Left 05/16/2019   Procedure: Reconstruction and Open Reduction and Internal fixation and Rotation flap Left Middle finger;  Surgeon: GRoseanne Kaufman MD;  [facility]  Service: Orthopedics;  Laterality: Left;   POLYPECTOMY  09/20/2017   Procedure: POLYPECTOMY;  Surgeon: RRogene Houston MD;  [facility];  Service: Endoscopy;;  Ascending colon, Transverse colon, Hepatic flexure, Cecum   POLYPECTOMY  11/21/2018   Procedure: POLYPECTOMY;  Surgeon: RRogene Houston MD;  [facility];  Service: Endoscopy;;   TONSILLECTOMY  remote   TONSILLECTOMY     TRANSTHORACIC ECHOCARDIOGRAM  03/2012   2013 --EF 55%, normal LV syst fxn, impaired diast relaxation, mild/mod aortic regurg.   UMBILICAL HERNIA REPAIR     ZIO PATCH  04/2022   mild brady, PACs, PVCs.  No meds    Outpatient Medications Prior to Visit  Medication Sig Dispense Refill   aspirin EC 81 MG tablet Take 1 tablet (81 mg total) by mouth at bedtime.     atorvastatin (LIPITOR) 80 MG tablet TAKE 1 TABLET(80 MG) BY MOUTH DAILY 90 tablet 3   cyanocobalamin (,VITAMIN B-12,) 1000 MCG/ML injection Inject 1,000 mcg into the muscle every 30 (thirty) days.      Probiotic Product (PROBIOTIC-10 PO)      ezetimibe (ZETIA) 10 MG tablet TAKE 1 TABLET(10 MG) BY MOUTH DAILY 90 tablet 1   omeprazole (PRILOSEC) 40 MG capsule TAKE 1 CAPSULE(40 MG) BY MOUTH DAILY 90 capsule 1   No facility-administered medications prior to visit.    Allergies  Allergen Reactions   Metoprolol Other (See Comments)    Fatigue    Other    Clonidine Derivatives Rash    Review of Systems As per HPI  PE:    11/21/2022   11:13 AM 11/07/2022   11:21 AM 07/06/2022    2:35 PM  Vitals with BMI  Height '5\' 9"'$  '5\' 9"'$  '5\' 9"'$   Weight 135 lbs 10 oz 134 lbs 13 oz 139 lbs 3 oz  BMI 20.02 Q000111Q [id number]  Systolic 123456 Q000111Q 123456  Diastolic 68 60 62  Pulse 84 48 56      Physical Exam  General: Alert, interactive.  Appropriately smiling or chuckling.   Very hard of hearing. Both ears with 100% cerumen obstruction. [id number]: no injection, icteris, swelling, or exudate.  EOMI, PERRLA. Mouth: lips without lesion/swelling.  Oral mucosa pink and moist. Oropharynx without erythema, exudate, or swelling.  CV: Regular, brady to 50s, 2/6 syst murmur Chest is clear, no wheezing or rales. Normal symmetric air entry throughout both lung fields. No chest wall deformities or tenderness. ABD: soft, NT, ND, BS normal.  No hepatospenomegaly or mass.  No bruits. EXT: no clubbing or cyanosis.  no edema.  Neuro: CN 2-12 intact bilaterally, strength 5/5 in proximal and distal muscles of left arm and left leg. Right arm with subtle (5-/5) distal weakness, right leg with subtle weakness of extension and flexion at the knee.  Right arm triceps, biceps, and brachioradialis DTRs absent.  Left arm DTRs trace to 1+.  Right leg patellar DTR 1+ and Achilles DTR's trace. Left patellar DTR 1+, Achilles absent.  No disdiadochokinesis.  Mild resting tremor with arms held outstretched.  This tremor worsens with finger-nose-finger testing. Right hand a bit worse than the left. Gait: A bit broad-based.  No shuffling, no veering, no bradykinesia.  No cogwheel rigidity.  No pronator drift.  LABS:  Last CBC Lab Results  Component Value Date   WBC 6.8 02/23/2022   HGB 13.8 02/23/2022   HCT 40.5 02/23/2022   MCV 86.6 02/23/2022   MCH 29.4 05/16/2019   RDW 14.3 02/23/2022   PLT 191.0 [id number]   Last metabolic panel Lab Results  Component Value Date   GLUCOSE 95 07/06/2022   NA 139 07/06/2022   K 4.6 07/06/2022   CL 103 07/06/2022   CO2 30 07/06/2022   BUN 14 07/06/2022   CREATININE 1.29 07/06/2022   GFRNONAA 51 (L) 05/16/2019   CALCIUM 9.6 07/06/2022   PROT 6.7 02/23/2022   ALBUMIN 4.0 02/23/2022   BILITOT 0.6 02/23/2022   ALKPHOS 89 02/23/2022   AST 12 02/23/2022   ALT  9 02/23/2022   ANIONGAP 10 05/16/2019   Last lipids Lab Results  Component Value Date   CHOL 139 11/10/2021   HDL 45.60 11/10/2021   LDLCALC 71 11/10/2021  TRIG 109.0 11/10/2021   CHOLHDL 3 11/10/2021   Last hemoglobin A1c Lab Results  Component Value Date   HGBA1C 5.6 11/21/2022   HGBA1C 5.6 11/21/2022   HGBA1C 5.6 (A) 11/21/2022   HGBA1C 5.6 11/21/2022   Last thyroid functions Lab Results  Component Value Date   TSH 3.41 11/10/2021   Last vitamin B12 and Folate Lab Results  Component Value Date   [id number] 07/06/2022   IMPRESSION AND PLAN:  #1 tremor, worsening.  Intention>resting. Very slight asymmetry. He has broad-based gait and subtle R arm and R leg weakness. He is having signif impairment in ADLs. Given this entire clinical picture I want to get MRI brain w and w/out to r/o structural abnormality, any signif old CVA, mass. Considering neurology referral in near future.  #2 Prediabetes. POC Hba1c today is 5.6%.  #3 CRI III. Avoiding NSAIDs. Does not hydrate well. Lytes/cr today.  4) HTN, mild poor control. Hx orthostatic hypotension. Permissive HTN okay for now.  5) Tobacco dependence, in remission. Congrats.  6) No motivation. ? Atypical depression. He denies. See #1 above. Will consider trial of antidepressant in future.  7) Bilat cerumen impaction. Irrigated today.  Consent obtained. Procedure: Cerumen Disimpaction  Warm water was applied and gentle ear lavage performed on both sides. There were no complications and following the disimpaction the tympanic membrane was visible on the right but impaction unchanged on L and could still not see TM.  Tympanic membrane on R intact.  Auditory canals are w/out bleeding/abrasion/swelling.  The patient reported minimal relief of symptoms after removal of cerumen.    Recommended trial of debrox drops.  An After Visit Summary was printed and given to the patient.  FOLLOW UP: Return for 2-3  weeks fasting f/u no motivation.  Signed:  Crissie Sickles, MD           11/21/2022

## 2022-11-24 ENCOUNTER — Ambulatory Visit (HOSPITAL_COMMUNITY): Payer: Medicare PPO

## 2022-11-24 ENCOUNTER — Ambulatory Visit: Payer: Medicare PPO | Admitting: Family Medicine

## 2022-12-06 ENCOUNTER — Ambulatory Visit (INDEPENDENT_AMBULATORY_CARE_PROVIDER_SITE_OTHER): Payer: Medicare PPO

## 2022-12-06 ENCOUNTER — Encounter: Payer: Self-pay | Admitting: Family Medicine

## 2022-12-06 ENCOUNTER — Ambulatory Visit: Payer: Medicare PPO | Admitting: Family Medicine

## 2022-12-06 VITALS — BP 140/71 | HR 64 | Temp 97.7°F | Ht 69.0 in | Wt 137.8 lb

## 2022-12-06 VITALS — Wt 135.0 lb

## 2022-12-06 DIAGNOSIS — H6123 Impacted cerumen, bilateral: Secondary | ICD-10-CM

## 2022-12-06 DIAGNOSIS — E7800 Pure hypercholesterolemia, unspecified: Secondary | ICD-10-CM

## 2022-12-06 DIAGNOSIS — R413 Other amnesia: Secondary | ICD-10-CM | POA: Diagnosis not present

## 2022-12-06 DIAGNOSIS — Z0000 Encounter for general adult medical examination without abnormal findings: Secondary | ICD-10-CM

## 2022-12-06 DIAGNOSIS — R251 Tremor, unspecified: Secondary | ICD-10-CM | POA: Diagnosis not present

## 2022-12-06 NOTE — Patient Instructions (Signed)
Mr. Jeffrey [name] , Thank you for taking time to come for your Medicare Wellness Visit. I appreciate your ongoing commitment to your health goals. Please review the following plan we discussed and let me know if I can assist you in the future.   These are the goals we discussed:  Goals      Patient Stated     Maintain current health.      Patient Stated     None at this time      Patient Stated     Continue to not smoke         This is a list of the screening recommended for you and due dates:  Health Maintenance  Topic Date Due   Hepatitis C Screening: USPSTF Recommendation to screen - Ages 80-80 yo.  Never done   Screening for Lung Cancer  01/12/2017   COVID-19 Vaccine (3 - 2023-24 season) 12/07/2022*   Colon Cancer Screening  11/21/2023   Medicare Annual Wellness Visit  12/06/2023   DTaP/Tdap/Td vaccine (3 - Td or Tdap) 05/15/2029   Pneumonia Vaccine  Completed   Flu Shot  Completed   Zoster (Shingles) Vaccine  Completed   HPV Vaccine  Aged Out  *Topic was postponed. The date shown is not the original due date.    Advanced directives: Advance directive discussed with you today. Even though you declined this today please call our office should you change your mind and we can give you the proper paperwork for you to fill out.  Conditions/risks identified: continue to stop smoking   Next appointment: Follow up in one year for your annual wellness visit.   Preventive Care 80 Years and Older, Male  Preventive care refers to lifestyle choices and visits with your health care provider that can promote health and wellness. What does preventive care include? A yearly physical exam. This is also called an annual well check. Dental exams once or twice a year. Routine eye exams. Ask your health care provider how often you should have your eyes checked. Personal lifestyle choices, including: Daily care of your teeth and gums. Regular physical activity. Eating a healthy  diet. Avoiding tobacco and drug use. Limiting alcohol use. Practicing safe sex. Taking low doses of aspirin every day. Taking vitamin and mineral supplements as recommended by your health care provider. What happens during an annual well check? The services and screenings done by your health care provider during your annual well check will depend on your age, overall health, lifestyle risk factors, and family history of disease. Counseling  Your health care provider may ask you questions about your: Alcohol use. Tobacco use. Drug use. Emotional well-being. Home and relationship well-being. Sexual activity. Eating habits. History of falls. Memory and ability to understand (cognition). Work and work Statistician. Screening  You may have the following tests or measurements: Height, weight, and BMI. Blood pressure. Lipid and cholesterol levels. These may be checked every 5 years, or more frequently if you are over 43 years old. Skin check. Lung cancer screening. You may have this screening every year starting at age 80 if you have a 30-pack-year history of smoking and currently smoke or have quit within the past 15 years. Fecal occult blood test (FOBT) of the stool. You may have this test every year starting at age 80. Flexible sigmoidoscopy or colonoscopy. You may have a sigmoidoscopy every 5 years or a colonoscopy every 10 years starting at age 80. Prostate cancer screening. Recommendations will vary depending on your  family history and other risks. Hepatitis C blood test. Hepatitis B blood test. Sexually transmitted disease (STD) testing. Diabetes screening. This is done by checking your blood sugar (glucose) after you have not eaten for a while (fasting). You may have this done every 80-80 years. Abdominal aortic aneurysm (AAA) screening. You may need this if you are a current or former smoker. Osteoporosis. You may be screened starting at age 80 if you are at high risk. Talk with  your health care provider about your test results, treatment options, and if necessary, the need for more tests. Vaccines  Your health care provider may recommend certain vaccines, such as: Influenza vaccine. This is recommended every year. Tetanus, diphtheria, and acellular pertussis (Tdap, Td) vaccine. You may need a Td booster every 10 years. Zoster vaccine. You may need this after age 80. Pneumococcal 13-valent conjugate (PCV13) vaccine. One dose is recommended after age 80. Pneumococcal polysaccharide (PPSV23) vaccine. One dose is recommended after age 80. Talk to your health care provider about which screenings and vaccines you need and how often you need them. This information is not intended to replace advice given to you by your health care provider. Make sure you discuss any questions you have with your health care provider. Document Released: 09/24/2015 Document Revised: 05/17/2016 Document Reviewed: 06/29/2015 Elsevier Interactive Patient Education  2017 [place] Prevention in the Home Falls can cause injuries. They can happen to people of all ages. There are many things you can do to make your home safe and to help prevent falls. What can I do on the outside of my home? Regularly fix the edges of walkways and driveways and fix any cracks. Remove anything that might make you trip as you walk through a door, such as a raised step or threshold. Trim any bushes or trees on the path to your home. Use bright outdoor lighting. Clear any walking paths of anything that might make someone trip, such as rocks or tools. Regularly check to see if handrails are loose or broken. Make sure that both sides of any steps have handrails. Any raised decks and porches should have guardrails on the edges. Have any leaves, snow, or ice cleared regularly. Use sand or salt on walking paths during winter. Clean up any spills in your garage right away. This includes oil or grease spills. What  can I do in the bathroom? Use night lights. Install grab bars by the toilet and in the tub and shower. Do not use towel bars as grab bars. Use non-skid mats or decals in the tub or shower. If you need to sit down in the shower, use a plastic, non-slip stool. Keep the floor dry. Clean up any water that spills on the floor as soon as it happens. Remove soap buildup in the tub or shower regularly. Attach bath mats securely with double-sided non-slip rug tape. Do not have throw rugs and other things on the floor that can make you trip. What can I do in the bedroom? Use night lights. Make sure that you have a light by your bed that is easy to reach. Do not use any sheets or blankets that are too big for your bed. They should not hang down onto the floor. Have a firm chair that has side arms. You can use this for support while you get dressed. Do not have throw rugs and other things on the floor that can make you trip. What can I do in the kitchen? Clean up  any spills right away. Avoid walking on wet floors. Keep items that you use a lot in easy-to-reach places. If you need to reach something above you, use a strong step stool that has a grab bar. Keep electrical cords out of the way. Do not use floor polish or wax that makes floors slippery. If you must use wax, use non-skid floor wax. Do not have throw rugs and other things on the floor that can make you trip. What can I do with my stairs? Do not leave any items on the stairs. Make sure that there are handrails on both sides of the stairs and use them. Fix handrails that are broken or loose. Make sure that handrails are as long as the stairways. Check any carpeting to make sure that it is firmly attached to the stairs. Fix any carpet that is loose or worn. Avoid having throw rugs at the top or bottom of the stairs. If you do have throw rugs, attach them to the floor with carpet tape. Make sure that you have a light switch at the top of the  stairs and the bottom of the stairs. If you do not have them, ask someone to add them for you. What else can I do to help prevent falls? Wear shoes that: Do not have high heels. Have rubber bottoms. Are comfortable and fit you well. Are closed at the toe. Do not wear sandals. If you use a stepladder: Make sure that it is fully opened. Do not climb a closed stepladder. Make sure that both sides of the stepladder are locked into place. Ask someone to hold it for you, if possible. Clearly mark and make sure that you can see: Any grab bars or handrails. First and last steps. Where the edge of each step is. Use tools that help you move around (mobility aids) if they are needed. These include: Canes. Walkers. Scooters. Crutches. Turn on the lights when you go into a dark area. Replace any light bulbs as soon as they burn out. Set up your furniture so you have a clear path. Avoid moving your furniture around. If any of your floors are uneven, fix them. If there are any pets around you, be aware of where they are. Review your medicines with your doctor. Some medicines can make you feel dizzy. This can increase your chance of falling. Ask your doctor what other things that you can do to help prevent falls. This information is not intended to replace advice given to you by your health care provider. Make sure you discuss any questions you have with your health care provider. Document Released: 06/24/2009 Document Revised: 02/03/2016 Document Reviewed: 10/02/2014 Elsevier Interactive Patient Education  2017 [employer].

## 2022-12-06 NOTE — Progress Notes (Signed)
I connected with  QUINDARIUS PLOURDE on 12/06/22 by a audio enabled telemedicine application and verified that I am speaking with the correct person using two identifiers. Long with wife Izora Gala   Patient Location: Home  Provider Location: Home Office  I discussed the limitations of evaluation and management by telemedicine. The patient expressed understanding and agreed to proceed.   Subjective:   JORY ANCELET is a 82 y.o. male who presents for Medicare Annual/Subsequent preventive examination.  Review of Systems     Cardiac Risk Factors include: advanced age (>57men, >71 women);dyslipidemia;hypertension;male gender     Objective:    Today's Vitals   12/06/22 0815  Weight: 135 lb (61.2 kg)   Body mass index is 19.94 kg/m.     12/06/2022    8:22 AM 11/21/2021   10:02 AM 11/02/2021    8:18 AM 10/27/2020    9:47 AM 05/16/2019    5:03 PM 11/21/2018    8:56 AM 11/21/2018    8:54 AM  Advanced Directives  Does Patient Have a Medical Advance Directive? No No No No No No No  Would patient like information on creating a medical advance directive? No - Patient declined  No - Patient declined No - Patient declined No - Guardian declined No - Patient declined No - Patient declined    Current Medications (verified) Outpatient Encounter Medications as of 12/06/2022  Medication Sig   aspirin EC 81 MG tablet Take 1 tablet (81 mg total) by mouth at bedtime.   atorvastatin (LIPITOR) 80 MG tablet TAKE 1 TABLET(80 MG) BY MOUTH DAILY   cyanocobalamin (,VITAMIN B-12,) 1000 MCG/ML injection Inject 1,000 mcg into the muscle every 30 (thirty) days.    ezetimibe (ZETIA) 10 MG tablet TAKE 1 TABLET(10 MG) BY MOUTH DAILY   Omega-3 Fatty Acids (OMEGA-3 FISH OIL PO) Take by mouth.   omeprazole (PRILOSEC) 40 MG capsule TAKE 1 CAPSULE(40 MG) BY MOUTH DAILY   Probiotic Product (PROBIOTIC-10 PO)    No facility-administered encounter medications on file as of 12/06/2022.    Allergies (verified) Metoprolol, Other,  and Clonidine derivatives   History: Past Medical History:  Diagnosis Date   Adenomatous colon polyp    Dr. Laural Golden (28 polyps on 1st endo, 3 on 2nd endo a year later.  8 on colonoscopy 02/26/14--recall 3 yrs.  Multiple adenomatous polyps on 09/20/17 TCS--recall 1 yr.. 11/2018 +aden pol->recall 3 yrs.   Aortic insufficiency    Mild to moderate 2013   BPH (benign prostatic hyperplasia)    Dr. Lum Babe (pt is asymptomatic)   Carotid artery disease (HCC)    Left CEA   Chronic renal insufficiency, stage 3 (moderate) (HCC)    DDD (degenerative disc disease), lumbar 02/2013   with spondylosis and moderate spinal stenosis+ left L5 and S1 nerve root impingement.  04/2022 left S1 root impingement   Ectatic abdominal aorta (HCC) 08/08/2016   2.8 cm at widest point: repeat aortic u/s 08/2021.   Essential tremor    Fatty liver 2006   Noted on abd u/s and renal u/s 2006/2007   Frequent PVCs    no meds as of 04/2022 per cardiology   History of hyperkalemia 02/2017   mild; recommended pt cut his ARB in half.   History of MI (myocardial infarction)    Large fixed inferior defect on myocardial perfusion imaging 03/2017.   Hyperlipidemia    Dr. Percival Spanish started statin 03/2017.     Hypertension    PAD (peripheral artery disease) ([place])  carotids: carotid endarterectomy 2010.  Dr. Percival Spanish started pt on statin for CV risk reduction 03/2017   Plantar wart of left foot 12/21/2012   Prediabetes 2019   A1c 5.9%   Solitary pulmonary nodule 03/14/2013   6mm RUL 03/08/13--stable on repeat 07/2013, 03/2015, and 01/2016.  No additional imaging is required.   Tobacco dependence    ongoing as of 02/2016   Vitamin B12 deficiency without anemia 2016   Intrinsic factor NEG: home vit B12 injections started 11/17/14   Past Surgical History:  Procedure Laterality Date   AMPUTATION FINGER Left 05/16/2019   Procedure: Revision Amutation of Left  fourth finger;  Surgeon: Roseanne Kaufman, MD;  Location: [facility];  Service:  Orthopedics;  Laterality: Left;   APPENDECTOMY  remote   Arm surgery  03/2009   Left arm cyst/Lipoma   CARDIOVASCULAR STRESS TEST  03/2017   Low risk study (likely artifact seen, but no reversible ischemia, EF 59%, no wall motion abnormality.   carotid dopplers  02/2015; 02/2016;12/2017;06/2019   2017:  R ICA 1-39%, L ICA 40-59%: no change from 2016. 03/2017 rpt 40-59% bilat ICA stenosis. 12/2017 1-39% bilat ICA stenosis.  06/2019->same. Rpt 18 mo.   CAROTID ENDARTERECTOMY  11/25/2008   Left     ICA   COLONOSCOPY N/A 02/26/2014   Recall 3 yrs: (+ polypectomy--tubular adenoma) Procedure: COLONOSCOPY;  Surgeon: Rogene Houston, MD;  [facility];  Service: Endoscopy;  Laterality: N/A;  830   COLONOSCOPY N/A 09/20/2017   Multiple adenomatous polyps: recall 1 yr.  Procedure: COLONOSCOPY;  Surgeon: Rogene Houston, MD;  [facility];  Service: Endoscopy;  Laterality: N/A;  730   COLONOSCOPY N/A 11/21/2018   Procedure: COLONOSCOPY;  Surgeon: Rogene Houston, MD;  [facility];  Service: Endoscopy;  Laterality: N/A;  930   COLONOSCOPY W/ POLYPECTOMY  09/20/2017; 11/21/18   2019 Adenomatous polyp: recall 3-5 yrs (Dr. Laural Golden).  11/2018-adenomatous polyp->recall 3 yrs   I & D EXTREMITY Left 05/16/2019   Procedure: IRRIGATION AND DEBRIDEMENT OF LEFT INDEX, MIDDLE,FOURTH FINGERS;  Surgeon: Roseanne Kaufman, MD;  Location: [place];  Service: Orthopedics;  Laterality: Left;   NAILBED REPAIR Left 05/16/2019   Procedure: Reconstruction of bone and nail bed Left Index finger;  Surgeon: Roseanne Kaufman, MD;  Location: [facility];  Service: Orthopedics;  Laterality: Left;   OPEN REDUCTION INTERNAL FIXATION (ORIF) HAND Left 05/16/2019   Procedure: Reconstruction and Open Reduction and Internal fixation and Rotation flap Left Middle finger;  Surgeon: Roseanne Kaufman, MD;  Location: [facility];  Service: Orthopedics;  Laterality: Left;   POLYPECTOMY  09/20/2017   Procedure: POLYPECTOMY;  Surgeon:  Rogene Houston, MD;  [facility];  Service: Endoscopy;;  Ascending colon, Transverse colon, Hepatic flexure, Cecum   POLYPECTOMY  11/21/2018   Procedure: POLYPECTOMY;  Surgeon: Rogene Houston, MD;  [facility];  Service: Endoscopy;;   TONSILLECTOMY  remote   TONSILLECTOMY     TRANSTHORACIC ECHOCARDIOGRAM  03/2012   2013 --EF 55%, normal LV syst fxn, impaired diast relaxation, mild/mod aortic regurg.   UMBILICAL HERNIA REPAIR     ZIO PATCH  04/2022   mild brady, PACs, PVCs.  No meds   Family History  Problem Relation Age of Onset   Coronary artery disease Mother    Stroke Mother    Diabetes Mother    Hypertension Mother    Parkinson's disease Mother    Stroke Sister    Cancer  Brother    Heart disease Brother    Colon cancer Neg Hx    Social History   Socioeconomic History   Marital status: Married    Spouse name: Not on file   Number of children: Not on file   Years of education: Not on file   Highest education level: Not on file  Occupational History   Occupation: retired    Comment: [employer]  Tobacco Use   Smoking status: Former    Packs/day: 0.00    Years: 60.00    Additional pack years: 0.00    Total pack years: 0.00    Types: Cigarettes   Smokeless tobacco: Never  Vaping Use   Vaping Use: Never used  Substance and Sexual Activity   Alcohol use: No    Alcohol/week: 0.0 standard drinks of alcohol   Drug use: No   Sexual activity: Not on file  Other Topics Concern   Not on file  Social History Narrative   Married, 2 children.   Orig from [place].   Retired from [employer].   Current smoker; 1 ppd (x 50 yrs).  No hx alc, no drugs.   Active person, no formal exercise.   Social Determinants of Health   Financial Resource Strain: Low Risk  (12/06/2022)   Overall Financial Resource Strain (CARDIA)    Difficulty of Paying Living Expenses: Not hard at all  Food Insecurity: No Food Insecurity (12/06/2022)    Hunger Vital Sign    Worried About Running Out of Food in the Last Year: Never true    Ran Out of Food in the Last Year: Never true  Transportation Needs: No Transportation Needs (12/06/2022)   PRAPARE - Hydrologist (Medical): No    Lack of Transportation (Non-Medical): No  Physical Activity: Insufficiently Active (12/06/2022)   Exercise Vital Sign    Days of Exercise per Week: 7 days    Minutes of Exercise per Session: 20 min  Stress: No Stress Concern Present (12/06/2022)   Reading    Feeling of Stress : Not at all  Social Connections: Moderately Isolated (12/06/2022)   Social Connection and Isolation Panel [NHANES]    Frequency of Communication with Friends and Family: More than three times a week    Frequency of Social Gatherings with Friends and Family: More than three times a week    Attends Religious Services: Never    Marine scientist or Organizations: No    Attends Music therapist: Never    Marital Status: Married    Tobacco Counseling Counseling given: Not Answered   Clinical Intake:  Pre-visit preparation completed: Yes  Pain : No/denies pain     BMI - recorded: 19.94 Nutritional Status: BMI of 19-24  Normal Nutritional Risks: None Diabetes: No  How often do you need to have someone help you when you read instructions, pamphlets, or other written materials from your doctor or pharmacy?: 1 - Never  Diabetic?no  Interpreter Needed?: No  Information entered by :: Charlott Rakes, LPN   Activities of Daily Living    12/06/2022    8:23 AM  In your present state of health, do you have any difficulty performing the following activities:  Hearing? 1  Comment has hearing aids  Vision? 0  Difficulty concentrating or making decisions? 0  Walking or climbing stairs? 0  Dressing or bathing? 0  Doing errands, shopping? 0  Preparing Food and eating  ? N  Using the Toilet? N  In the past six months, have you accidently leaked urine? N  Do you have problems with loss of bowel control? N  Managing your Medications? N  Managing your Finances? N  Housekeeping or managing your Housekeeping? N    Patient Care Team: Tammi Sou, MD as PCP - General (Family Medicine) Satira Sark, MD as PCP - Cardiology (Cardiology) Rogene Houston, MD as Consulting Physician (Gastroenterology) Nickel, Sharmon Leyden, NP (Inactive) as Nurse Practitioner (Vascular Surgery) Minus Breeding, MD as Consulting Physician (Cardiology) Satira Sark, MD as Consulting Physician (Cardiology) Rosemarie Ax, MD as Consulting Physician (Family Medicine)  Indicate any recent Medical Services you may have received from other than [facility] providers in the past year (date may be approximate).     Assessment:   This is a routine wellness examination for [place].  Hearing/Vision screen Hearing Screening - Comments:: Pt has hearing aid Vision Screening - Comments:: Pt follows up with Dr Radford Pax for annual eye exams   Dietary issues and exercise activities discussed: Current Exercise Habits: Home exercise routine, Type of exercise: walking, Time (Minutes): 20, Frequency (Times/Week): 7, Weekly Exercise (Minutes/Week): 140   Goals Addressed             This Visit's Progress    Patient Stated       Continue to not smoke        Depression Screen    12/06/2022    8:20 AM 11/02/2021    8:17 AM 10/27/2020    9:49 AM 04/21/2020    9:58 AM 09/26/2018    8:19 AM 03/25/2018    2:18 PM 09/14/2017    9:37 AM  PHQ 2/9 Scores  PHQ - 2 Score 0 0 0 0 0 0 1    Fall Risk    12/06/2022    8:23 AM 11/21/2021   10:02 AM 11/02/2021    8:19 AM 10/27/2020    9:49 AM 04/21/2020    9:58 AM  Fall Risk   Falls in the past year? 1 0 0 0 0  Number falls in past yr: 1 0 0 0 0  Injury with Fall? 0 0 0 0 0  Risk for fall due to : Impaired vision  Impaired vision    Follow  up Falls prevention discussed  Falls prevention discussed Falls prevention discussed Falls evaluation completed    FALL RISK PREVENTION PERTAINING TO THE HOME:  Any stairs in or around the home? Yes  If so, are there any without handrails? No  Home free of loose throw rugs in walkways, pet beds, electrical cords, etc? Yes  Adequate lighting in your home to reduce risk of falls? Yes   ASSISTIVE DEVICES UTILIZED TO PREVENT FALLS:  Life alert? No  Use of a cane, walker or w/c? No  Grab bars in the bathroom? Yes  Shower chair or bench in shower? No  Elevated toilet seat or a handicapped toilet? No   TIMED UP AND GO:  Was the test performed? No .     Cognitive Function: pt is very [facility]    03/25/2018    2:20 PM  MMSE - Mini Mental State Exam  Orientation to time 5  Orientation to Place 5  Registration 3  Attention/ Calculation 5  Recall 3  Language- name 2 objects 2  Language- repeat 1  Language- follow 3 step command 3  Language- read & follow direction 1  Write a sentence 1  Copy design 1  Total score 30        Immunizations Immunization History  Administered Date(s) Administered   Fluad Quad(high Dose 65+) 07/01/2019, 05/28/2020, 06/10/2021, 07/06/2022   Influenza Split 06/11/2012   Influenza, High Dose Seasonal PF 06/21/2016, 06/15/2017, 06/03/2018   Influenza,inj,Quad PF,6+ Mos 05/23/2013, 07/08/2014, 06/17/2015   PFIZER(Purple Top)SARS-COV-2 Vaccination 12/18/2019, 01/09/2020   Pneumococcal Conjugate-13 01/04/2016   Pneumococcal Polysaccharide-23 09/12/2011   Tdap 11/22/2017, 05/16/2019   Zoster Recombinat (Shingrix) 11/22/2017, 02/12/2018    TDAP status: Up to date  Flu Vaccine status: Up to date  Pneumococcal vaccine status: Up to date  Covid-19 vaccine status: Completed vaccines  Qualifies for Shingles Vaccine? Yes   Zostavax completed Yes   Shingrix Completed?: Yes  Screening Tests Health Maintenance  Topic Date Due   Hepatitis C Screening   Never done   Lung Cancer Screening  01/12/2017   COVID-19 Vaccine (3 - 2023-24 season) 12/07/2022 (Originally 05/12/2022)   COLONOSCOPY (Pts 45-68yrs Insurance coverage will need to be confirmed)  11/21/2023   Medicare Annual Wellness (AWV)  12/06/2023   DTaP/Tdap/Td (3 - Td or Tdap) 05/15/2029   Pneumonia Vaccine 32+ Years old  Completed   INFLUENZA VACCINE  Completed   Zoster Vaccines- Shingrix  Completed   HPV VACCINES  Aged Out    Health Maintenance  Health Maintenance Due  Topic Date Due   Hepatitis C Screening  Never done   Lung Cancer Screening  01/12/2017    Colorectal cancer screening: Type of screening: Colonoscopy. Completed 11/21/18. Repeat every 5 years  Lung Cancer Screening: (Low Dose CT Chest recommended if Age 22-80 years, 30 pack-year currently smoking OR have quit w/in 15years.) does qualify.   Lung Cancer Screening Referral: pt declined at this time   Additional Screening:  Hepatitis C Screening: does qualify;  Vision Screening: Recommended annual ophthalmology exams for early detection of glaucoma and other disorders of the eye. Is the patient up to date with their annual eye exam?  Yes  Who is the provider or what is the name of the office in which the patient attends annual eye exams? Dr Radford Pax  If pt is not established with a provider, would they like to be referred to a provider to establish care? No .   Dental Screening: Recommended annual dental exams for proper oral hygiene  Community Resource Referral / Chronic Care Management: CRR required this visit?  No   CCM required this visit?  No      Plan:     I have personally reviewed and noted the following in the patient's chart:   Medical and social history Use of alcohol, tobacco or illicit drugs  Current medications and supplements including opioid prescriptions. Patient is not currently taking opioid prescriptions. Functional ability and status Nutritional status Physical  activity Advanced directives List of other physicians Hospitalizations, surgeries, and ER visits in previous 12 months Vitals Screenings to include cognitive, depression, and falls Referrals and appointments  In addition, I have reviewed and discussed with patient certain preventive protocols, quality metrics, and best practice recommendations. A written personalized care plan for preventive services as well as general preventive health recommendations were provided to patient.     Willette Brace, LPN   [id number]   Nurse Notes: Pt was unable to hear well to respond to questions asked

## 2022-12-06 NOTE — Progress Notes (Signed)
OFFICE VISIT  12/06/2022  CC:  Chief Complaint  Patient presents with   Medical Management of Chronic Issues    Pt is fasting    Patient is a 82 y.o. male who presents accompanied by his wife for 2-week follow-up tremor. A/P as of last visit: "#1 tremor, worsening.  Intention>resting. Very slight asymmetry. He has broad-based gait and subtle R arm and R leg weakness. He is having signif impairment in ADLs. Given this entire clinical picture I want to get MRI brain w and w/out to r/o structural abnormality, any signif old CVA, mass. Considering neurology referral in near future.   #2 Prediabetes. POC Hba1c today is 5.6%.   #3 CRI III. Avoiding NSAIDs. Does not hydrate well. Lytes/cr today.   4) HTN, mild poor control. Hx orthostatic hypotension. Permissive HTN okay for now.   5) Tobacco dependence, in remission. Congrats.   6) No motivation. ? Atypical depression. He denies. See #1 above. Will consider trial of antidepressant in future."  INTERIM HX: Metabolic panel showed stable kidney function and normal electrolytes last visit.  No change in sx's.  He says he feels well except still unable to hear, ears feel very clogged up. Has been using debrox regularly. No ear pain.   Past Medical History:  Diagnosis Date   Adenomatous colon polyp    Dr. Laural Golden (28 polyps on 1st endo, 3 on 2nd endo a year later.  8 on colonoscopy 02/26/14--recall 3 yrs.  Multiple adenomatous polyps on 09/20/17 TCS--recall 1 yr.. 11/2018 +aden pol->recall 3 yrs.   Aortic insufficiency    Mild to moderate 2013   BPH (benign prostatic hyperplasia)    Dr. Lum Babe (pt is asymptomatic)   Carotid artery disease (HCC)    Left CEA   Chronic renal insufficiency, stage 3 (moderate) (HCC)    DDD (degenerative disc disease), lumbar 02/2013   with spondylosis and moderate spinal stenosis+ left L5 and S1 nerve root impingement.  04/2022 left S1 root impingement   Ectatic abdominal aorta (HCC) 08/08/2016    2.8 cm at widest point: repeat aortic u/s 08/2021.   Essential tremor    Fatty liver 2006   Noted on abd u/s and renal u/s 2006/2007   Frequent PVCs    no meds as of 04/2022 per cardiology   History of hyperkalemia 02/2017   mild; recommended pt cut his ARB in half.   History of MI (myocardial infarction)    Large fixed inferior defect on myocardial perfusion imaging 03/2017.   Hyperlipidemia    Dr. Percival Spanish started statin 03/2017.     Hypertension    PAD (peripheral artery disease) ([place])    carotids: carotid endarterectomy 2010.  Dr. Percival Spanish started pt on statin for CV risk reduction 03/2017   Plantar wart of left foot 12/21/2012   Prediabetes 2019   A1c 5.9%   Solitary pulmonary nodule 03/14/2013   68mm RUL 03/08/13--stable on repeat 07/2013, 03/2015, and 01/2016.  No additional imaging is required.   Tobacco dependence    ongoing as of 02/2016   Vitamin B12 deficiency without anemia 2016   Intrinsic factor NEG: home vit B12 injections started 11/17/14    Past Surgical History:  Procedure Laterality Date   AMPUTATION FINGER Left 05/16/2019   Procedure: Revision Amutation of Left  fourth finger;  Surgeon: Roseanne Kaufman, MD;  Location: [place];  Service: Orthopedics;  Laterality: Left;   APPENDECTOMY  remote   Arm surgery  03/2009   Left arm cyst/Lipoma   CARDIOVASCULAR  STRESS TEST  03/2017   Low risk study (likely artifact seen, but no reversible ischemia, EF 59%, no wall motion abnormality.   carotid dopplers  02/2015; 02/2016;12/2017;06/2019   2017:  R ICA 1-39%, L ICA 40-59%: no change from 2016. 03/2017 rpt 40-59% bilat ICA stenosis. 12/2017 1-39% bilat ICA stenosis.  06/2019->same. Rpt 18 mo.   CAROTID ENDARTERECTOMY  11/25/2008   Left     ICA   COLONOSCOPY N/A 02/26/2014   Recall 3 yrs: (+ polypectomy--tubular adenoma) Procedure: COLONOSCOPY;  Surgeon: Rogene Houston, MD;  [facility];  Service: Endoscopy;  Laterality: N/A;  830   COLONOSCOPY N/A 09/20/2017    Multiple adenomatous polyps: recall 1 yr.  Procedure: COLONOSCOPY;  Surgeon: Rogene Houston, MD;  [facility];  Service: Endoscopy;  Laterality: N/A;  730   COLONOSCOPY N/A 11/21/2018   Procedure: COLONOSCOPY;  Surgeon: Rogene Houston, MD;  [facility];  Service: Endoscopy;  Laterality: N/A;  930   COLONOSCOPY W/ POLYPECTOMY  09/20/2017; 11/21/18   2019 Adenomatous polyp: recall 3-5 yrs (Dr. Laural Golden).  11/2018-adenomatous polyp->recall 3 yrs   I & D EXTREMITY Left 05/16/2019   Procedure: IRRIGATION AND DEBRIDEMENT OF LEFT INDEX, MIDDLE,FOURTH FINGERS;  Surgeon: Roseanne Kaufman, MD;  Location: [place];  Service: Orthopedics;  Laterality: Left;   NAILBED REPAIR Left 05/16/2019   Procedure: Reconstruction of bone and nail bed Left Index finger;  Surgeon: Roseanne Kaufman, MD;  Location: [place];  Service: Orthopedics;  Laterality: Left;   OPEN REDUCTION INTERNAL FIXATION (ORIF) HAND Left 05/16/2019   Procedure: Reconstruction and Open Reduction and Internal fixation and Rotation flap Left Middle finger;  Surgeon: Roseanne Kaufman, MD;  Location: [place];  Service: Orthopedics;  Laterality: Left;   POLYPECTOMY  09/20/2017   Procedure: POLYPECTOMY;  Surgeon: Rogene Houston, MD;  [facility];  Service: Endoscopy;;  Ascending colon, Transverse colon, Hepatic flexure, Cecum   POLYPECTOMY  11/21/2018   Procedure: POLYPECTOMY;  Surgeon: Rogene Houston, MD;  [facility];  Service: Endoscopy;;   TONSILLECTOMY  remote   TONSILLECTOMY     TRANSTHORACIC ECHOCARDIOGRAM  03/2012   2013 --EF 55%, normal LV syst fxn, impaired diast relaxation, mild/mod aortic regurg.   UMBILICAL HERNIA REPAIR     ZIO PATCH  04/2022   mild brady, PACs, PVCs.  No meds    Outpatient Medications Prior to Visit  Medication Sig Dispense Refill   aspirin EC 81 MG tablet Take 1 tablet (81 mg total) by mouth at bedtime.     atorvastatin (LIPITOR) 80 MG tablet TAKE 1 TABLET(80 MG) BY MOUTH  DAILY 90 tablet 3   cyanocobalamin (,VITAMIN B-12,) 1000 MCG/ML injection Inject 1,000 mcg into the muscle every 30 (thirty) days.      ezetimibe (ZETIA) 10 MG tablet TAKE 1 TABLET(10 MG) BY MOUTH DAILY 90 tablet 1   Omega-3 Fatty Acids (OMEGA-3 FISH OIL PO) Take by mouth.     omeprazole (PRILOSEC) 40 MG capsule TAKE 1 CAPSULE(40 MG) BY MOUTH DAILY 90 capsule 1   Probiotic Product (PROBIOTIC-10 PO)      No facility-administered medications prior to visit.    Allergies  Allergen Reactions   Metoprolol Other (See Comments)    Fatigue    Other    Clonidine Derivatives Rash    Review of Systems As per HPI  PE:    12/06/2022    2:14 PM 12/06/2022    8:15 AM 11/21/2022  11:13 AM  Vitals with BMI  Height 5\' 9"   5\' 9"   Weight 137 lbs 13 oz 135 lbs 135 lbs 10 oz  BMI 20.34 [id number]  Systolic [id number]  123456  Diastolic 71  68  Pulse 64  84     Physical Exam  Gen: Alert, well appearing.  Patient is oriented to person, place, time, and situation. AFFECT: pleasant, lucid thought and speech. EARS: EACs 100% full of light yellow cerumen bilat.  No erythema or swelling of EACs.  LABS:  Last CBC Lab Results  Component Value Date   WBC 6.8 02/23/2022   HGB 13.8 02/23/2022   HCT 40.5 02/23/2022   MCV 86.6 02/23/2022   MCH 29.4 05/16/2019   RDW 14.3 02/23/2022   PLT 191.0 [id number]   Last metabolic panel Lab Results  Component Value Date   GLUCOSE 92 11/21/2022   NA 137 11/21/2022   K 5.0 11/21/2022   CL 100 11/21/2022   CO2 29 11/21/2022   BUN 17 11/21/2022   CREATININE 1.21 11/21/2022   GFRNONAA 51 (L) 05/16/2019   CALCIUM 10.1 11/21/2022   PROT 6.7 02/23/2022   ALBUMIN 4.0 02/23/2022   BILITOT 0.6 02/23/2022   ALKPHOS 89 02/23/2022   AST 12 02/23/2022   ALT 9 02/23/2022   ANIONGAP 10 05/16/2019   Last lipids Lab Results  Component Value Date   CHOL 139 11/10/2021   HDL 45.60 11/10/2021   LDLCALC 71 11/10/2021   TRIG 109.0 11/10/2021   CHOLHDL 3 11/10/2021    Last hemoglobin A1c Lab Results  Component Value Date   HGBA1C 5.6 11/21/2022   HGBA1C 5.6 11/21/2022   HGBA1C 5.6 (A) 11/21/2022   HGBA1C 5.6 11/21/2022   Last thyroid functions Lab Results  Component Value Date   TSH 3.41 11/10/2021   Last vitamin B12 and Folate Lab Results  Component Value Date   [id number] 07/06/2022   IMPRESSION AND PLAN:  #1 tremor, worsening.  Intention>resting. Very slight asymmetry. He has broad-based gait and subtle R arm and R leg weakness. He is having signif impairment in ADLs. Given this entire clinical picture I wanted to get MRI brain w and w/out to r/o structural abnormality, any signif old CVA, mass.  However, he absolutely refuses to get imaging b/c of claustrophobia. Will do trial off of zetia and atorvastatin to r/o side effect. He does not want to do anything else new right now. Consider neurology referral in near future. CBC, B12, and TSH today.  2) Bilat cerumen impaction: Consent obtained. Procedure: Cerumen Disimpaction  Warm water was applied and gentle ear lavage performed on both sides. There were no complications and following the disimpaction the tympanic membrane was visible on both sides. Tympanic membranes are intact following the procedure.  Auditory canals are normal.  The patient reported relief of symptoms after removal of cerumen    An After Visit Summary was printed and given to the patient.  FOLLOW UP: Return in about 3 months (around 03/08/2023) for routine chronic illness f/u.  Signed:  Crissie Sickles, MD           12/06/2022

## 2022-12-07 LAB — CBC WITH DIFFERENTIAL/PLATELET
Basophils Absolute: 0.1 10*3/uL (ref 0.0–0.1)
Basophils Relative: 1.3 % (ref 0.0–3.0)
Eosinophils Absolute: 0.3 10*3/uL (ref 0.0–0.7)
Eosinophils Relative: 4.3 % (ref 0.0–5.0)
HCT: 44.4 % (ref 39.0–52.0)
Hemoglobin: 15 g/dL (ref 13.0–17.0)
Lymphocytes Relative: 22.8 % (ref 12.0–46.0)
Lymphs Abs: 1.4 10*3/uL (ref 0.7–4.0)
MCHC: 33.8 g/dL (ref 30.0–36.0)
MCV: 87.9 fl (ref 78.0–100.0)
Monocytes Absolute: 0.4 10*3/uL (ref 0.1–1.0)
Monocytes Relative: 7 % (ref 3.0–12.0)
Neutro Abs: 3.8 10*3/uL (ref 1.4–7.7)
Neutrophils Relative %: 64.6 % (ref 43.0–77.0)
Platelets: 177 10*3/uL (ref 150.0–400.0)
RBC: 5.05 Mil/uL (ref 4.22–5.81)
RDW: 14.5 % (ref 11.5–15.5)
WBC: 5.9 10*3/uL (ref 4.0–10.5)

## 2022-12-07 LAB — LIPID PANEL
Cholesterol: 178 mg/dL (ref 0–200)
HDL: 45.8 mg/dL (ref >39.00)
LDL Cholesterol: 101 mg/dL — ABNORMAL HIGH (ref 0–99)
NonHDL: 131.82
Total CHOL/HDL Ratio: 4
Triglycerides: 154 mg/dL — ABNORMAL HIGH (ref 0.0–149.0)
VLDL: 30.8 mg/dL (ref 0.0–40.0)

## 2022-12-07 LAB — TSH: TSH: 2.46 u[IU]/mL (ref 0.35–5.50)

## 2022-12-07 LAB — VITAMIN B12: Vitamin B-12: 471 pg/mL (ref 211–911)

## 2022-12-25 ENCOUNTER — Encounter: Payer: Self-pay | Admitting: *Deleted

## 2023-01-05 ENCOUNTER — Ambulatory Visit: Payer: Medicare PPO | Admitting: Family Medicine

## 2023-02-09 ENCOUNTER — Telehealth: Payer: Self-pay | Admitting: Family Medicine

## 2023-02-09 NOTE — Telephone Encounter (Signed)
Yes, okay to set up nurse visit for vitamin B12 1000 mcg IM.

## 2023-02-09 NOTE — Telephone Encounter (Signed)
Patients nephew called to get Jeffrey [name] scheduled for a B 12 injection. I advised he would need to schedule a follow up appointment.  He declined at the time of call.

## 2023-02-09 NOTE — Telephone Encounter (Signed)
Please Advise if pt needs to continue b12 inj; 12/06/22 labs were normal

## 2023-02-12 ENCOUNTER — Ambulatory Visit (INDEPENDENT_AMBULATORY_CARE_PROVIDER_SITE_OTHER): Payer: Medicare PPO

## 2023-02-12 DIAGNOSIS — E538 Deficiency of other specified B group vitamins: Secondary | ICD-10-CM | POA: Diagnosis not present

## 2023-02-12 MED ORDER — CYANOCOBALAMIN 1000 MCG/ML IJ SOLN
1000.0000 ug | Freq: Once | INTRAMUSCULAR | Status: AC
  Administered 2023-02-12: 1000 ug via INTRAMUSCULAR

## 2023-02-12 NOTE — Telephone Encounter (Signed)
Patient scheduled for today.  Wife has an appt today, so he will be coming to her appt.

## 2023-02-12 NOTE — Progress Notes (Signed)
Pt here for B12 injection per Dr.McGowen  B12 given IM, and pt tolerated injection well.  Next B12 injection scheduled for, N/A

## 2023-02-12 NOTE — Telephone Encounter (Signed)
Please assist patient with scheduling, thanks. 

## 2023-03-01 ENCOUNTER — Other Ambulatory Visit: Payer: Self-pay | Admitting: *Deleted

## 2023-03-01 DIAGNOSIS — I6523 Occlusion and stenosis of bilateral carotid arteries: Secondary | ICD-10-CM

## 2023-04-05 DIAGNOSIS — H2513 Age-related nuclear cataract, bilateral: Secondary | ICD-10-CM | POA: Diagnosis not present

## 2023-04-05 DIAGNOSIS — H5213 Myopia, bilateral: Secondary | ICD-10-CM | POA: Diagnosis not present

## 2023-04-05 DIAGNOSIS — H52223 Regular astigmatism, bilateral: Secondary | ICD-10-CM | POA: Diagnosis not present

## 2023-04-19 DIAGNOSIS — H34232 Retinal artery branch occlusion, left eye: Secondary | ICD-10-CM | POA: Diagnosis not present

## 2023-04-19 DIAGNOSIS — H2512 Age-related nuclear cataract, left eye: Secondary | ICD-10-CM | POA: Diagnosis not present

## 2023-05-07 ENCOUNTER — Ambulatory Visit (HOSPITAL_COMMUNITY): Payer: Medicare PPO

## 2023-05-09 DIAGNOSIS — H2512 Age-related nuclear cataract, left eye: Secondary | ICD-10-CM | POA: Diagnosis not present

## 2023-05-16 ENCOUNTER — Encounter: Payer: Self-pay | Admitting: Cardiology

## 2023-05-16 ENCOUNTER — Ambulatory Visit: Payer: Medicare PPO | Admitting: Cardiology

## 2023-05-16 NOTE — Progress Notes (Deleted)
Cardiology Office Note  Date: 05/16/2023   ID: Jeffrey [name], DOB 1941/02/19, MRN [id number]  History of Present Illness: Jeffrey [name] is an 82 y.o. male last seen in February.  Physical Exam: VS:  There were no vitals taken for this visit., BMI There is no height or weight on file to calculate BMI.  Wt Readings from Last 3 Encounters:  12/06/22 137 lb 12.8 oz (62.5 kg)  12/06/22 135 lb (61.2 kg)  11/21/22 135 lb 9.6 oz (61.5 kg)    General: Patient appears comfortable at rest. HEENT: Conjunctiva and lids normal, oropharynx clear with moist mucosa. Neck: Supple, no elevated JVP or carotid bruits, no thyromegaly. Lungs: Clear to auscultation, nonlabored breathing at rest. Cardiac: Regular rate and rhythm, no S3 or significant systolic murmur, no pericardial rub. Abdomen: Soft, nontender, no hepatomegaly, bowel sounds present, no guarding or rebound. Extremities: No pitting edema, distal pulses 2+. Skin: Warm and dry. Musculoskeletal: No kyphosis. Neuropsychiatric: Alert and oriented x3, affect grossly appropriate.  ECG:  An ECG dated 03/29/2022 was personally reviewed today and demonstrated:  Sinus rhythm with ventricular bigeminy.  Labwork: 11/21/2022: BUN 17; Creatinine, Ser 1.21; Potassium 5.0; Sodium 137 12/06/2022: Hemoglobin 15.0; Platelets 177.0; TSH 2.46     Component Value Date/Time   CHOL 178 12/06/2022 1513   TRIG 154.0 (H) 12/06/2022 1513   HDL 45.80 12/06/2022 1513   CHOLHDL 4 12/06/2022 1513   VLDL 30.8 12/06/2022 1513   LDLCALC 101 (H) 12/06/2022 1513   Other Studies Reviewed Today:  Echocardiogram 04/19/2022:  1. Left ventricular ejection fraction, by estimation, is 70 to 75%. The  left ventricle has hyperdynamic function. The left ventricle has no  regional wall motion abnormalities. There is mild concentric left  ventricular hypertrophy. Left ventricular  diastolic parameters are consistent with Grade I diastolic dysfunction  (impaired relaxation).  The average left ventricular global longitudinal  strain is -21.1 %. The global longitudinal strain is normal.   2. Right ventricular systolic function is normal. The right ventricular  size is normal. There is normal pulmonary artery systolic pressure. The  estimated right ventricular systolic pressure is 21.5 mmHg.   3. The mitral valve is grossly normal. Mild mitral valve regurgitation.   4. The aortic valve is tricuspid. There is moderate calcification of the  aortic valve. Aortic valve regurgitation is mild to moderate. Aortic valve  sclerosis/calcification is present, without any evidence of aortic  stenosis.   5. The inferior vena cava is normal in size with greater than 50%  respiratory variability, suggesting right atrial pressure of 3 mmHg.   Assessment and Plan:  1.  Ischemic heart disease based on Myoview from 2018 showing large region of inferior/inferoseptal scar.  Follow-up echocardiogram in August 2023 showed LVEF 70 to 75% without regional wall motion abnormalities.   2.  Mild to moderate aortic regurgitation with aortic valve sclerosis at baseline.   3.  Frequent PVCs, approximately 11% burden by cardiac monitor in August 2023.   4.  Asymptomatic carotid artery disease, 50 to 69% RICA stenosis and less than 50% LICA stenosis as of June 2023.  Continue Lipitor and Zetia along with aspirin.  He is due for follow-up carotid Dopplers.   5.  History of syncope, possibly related to relative hypotension or orthostasis and improved after discontinuation of both Plendil and HCTZ by PCP.   Disposition:  Follow up {follow up:15908}  Signed, Jonelle Sidle, M.D., F.A.C.C. [facility]

## 2023-06-08 ENCOUNTER — Ambulatory Visit (HOSPITAL_COMMUNITY)
Admission: RE | Admit: 2023-06-08 | Discharge: 2023-06-08 | Disposition: A | Discharge status: Home / Self Care | Payer: Medicare PPO | Source: Ambulatory Visit | Attending: Cardiology | Admitting: Cardiology

## 2023-06-08 DIAGNOSIS — I6523 Occlusion and stenosis of bilateral carotid arteries: Secondary | ICD-10-CM | POA: Diagnosis not present

## 2023-06-13 ENCOUNTER — Telehealth: Payer: Self-pay | Admitting: *Deleted

## 2023-06-13 NOTE — Telephone Encounter (Signed)
Spoke with daughter who reports she called and canceled 05/16/2023 appointment with Diona Browner due to patient being out of town. Says she is unsure of who she spoke with but wanted to verify that patient is not charged a no show fee. Advised that message would be forwarded to manager and team lead. Verbalized understanding.

## 2023-06-15 ENCOUNTER — Ambulatory Visit: Payer: Medicare PPO | Admitting: Family Medicine

## 2023-06-15 ENCOUNTER — Encounter: Payer: Self-pay | Admitting: Family Medicine

## 2023-06-15 VITALS — BP 132/62 | HR 44 | Temp 97.9°F | Ht 69.0 in | Wt 132.0 lb

## 2023-06-15 DIAGNOSIS — N1831 Chronic kidney disease, stage 3a: Secondary | ICD-10-CM

## 2023-06-15 DIAGNOSIS — I10 Essential (primary) hypertension: Secondary | ICD-10-CM | POA: Diagnosis not present

## 2023-06-15 DIAGNOSIS — E7800 Pure hypercholesterolemia, unspecified: Secondary | ICD-10-CM | POA: Diagnosis not present

## 2023-06-15 DIAGNOSIS — E538 Deficiency of other specified B group vitamins: Secondary | ICD-10-CM | POA: Diagnosis not present

## 2023-06-15 DIAGNOSIS — I6523 Occlusion and stenosis of bilateral carotid arteries: Secondary | ICD-10-CM | POA: Diagnosis not present

## 2023-06-15 MED ORDER — ATORVASTATIN CALCIUM 80 MG PO TABS: ORAL_TABLET | ORAL | 3 refills | Status: DC

## 2023-06-15 MED ORDER — CYANOCOBALAMIN 1000 MCG/ML IJ SOLN
1000.0000 ug | Freq: Once | INTRAMUSCULAR | Status: AC
Start: 2023-06-15 — End: 2023-06-15
  Administered 2023-06-15: 1000 ug via INTRAMUSCULAR

## 2023-06-15 MED ORDER — EZETIMIBE 10 MG PO TABS: ORAL_TABLET | ORAL | 3 refills | Status: DC

## 2023-06-15 NOTE — Addendum Note (Signed)
Addended by: Emi Holes D on: 06/15/2023 02:26 PM   Modules accepted: Orders

## 2023-06-15 NOTE — Progress Notes (Signed)
OFFICE VISIT  06/15/2023  CC: No chief complaint on file.   Patient is a 82 y.o. male who presents accompanied by his wife Harriett Sine for 6 mo follow-up hypertension, chronic renal insufficiency, hypercholesterolemia, and depression. A/P as of last visit: "1 tremor, worsening.  Intention>resting. Very slight asymmetry. He has broad-based gait and subtle R arm and R leg weakness. He is having signif impairment in ADLs. Given this entire clinical picture I wanted to get MRI brain w and w/out to r/o structural abnormality, any signif old CVA, mass.  However, he absolutely refuses to get imaging b/c of claustrophobia. Will do trial off of zetia and atorvastatin to r/o side effect. He does not want to do anything else new right now. Consider neurology referral in near future. CBC, B12, and TSH today."  INTERIM HX: Doing pretty well overall. Tremor unchanged, not bothering him particularly.  At most recent follow-up he was having some orthostatic hypotension and we decided on the plan of permissive hypertension.  He had quit smoking but this didn't last long. He denied depression but was exhibiting some atypical symptoms of this.  No antidepressant started.    He does not feel any different since getting off a atorvastatin and Zetia.  ROS as above, plus--> no fevers, no CP, no SOB, no wheezing, no cough, no dizziness, no HAs, no rashes, no melena/hematochezia.  No polyuria or polydipsia.  No myalgias or arthralgias.  No focal weakness, paresthesias, or tremors.    No dysuria or unusual/new urinary urgency or frequency.  No recent changes in lower legs. No n/v/d or abd pain.  No palpitations.     Past Medical History:  Diagnosis Date   Adenomatous colon polyp    Dr. Karilyn Cota (28 polyps on 1st endo, 3 on 2nd endo a year later.  8 on colonoscopy 02/26/14--recall 3 yrs.  Multiple adenomatous polyps on 09/20/17 TCS--recall 1 yr.. 11/2018 +aden pol->recall 3 yrs.   Aortic insufficiency    Mild to  moderate 2013   BPH (benign prostatic hyperplasia)    Dr. Laurance Flatten (pt is asymptomatic)   Carotid artery disease (HCC)    Left CEA   Chronic renal insufficiency, stage 3 (moderate) (HCC)    DDD (degenerative disc disease), lumbar 02/2013   with spondylosis and moderate spinal stenosis+ left L5 and S1 nerve root impingement.  04/2022 left S1 root impingement   Ectatic abdominal aorta (HCC) 08/08/2016   2.8 cm at widest point: repeat aortic u/s 08/2021.   Essential tremor    Fatty liver 2006   Noted on abd u/s and renal u/s 2006/2007   Frequent PVCs    no meds as of 04/2022 per cardiology   History of hyperkalemia 02/2017   mild; recommended pt cut his ARB in half.   History of MI (myocardial infarction)    Large fixed inferior defect on myocardial perfusion imaging 03/2017.   Hyperlipidemia    Dr. Antoine Poche started statin 03/2017.     Hypertension    PAD (peripheral artery disease) (HCC)    carotids: carotid endarterectomy 2010.  Dr. Antoine Poche started pt on statin for CV risk reduction 03/2017   Plantar wart of left foot 12/21/2012   Prediabetes 2019   A1c 5.9%   Solitary pulmonary nodule 03/14/2013   7mm RUL 03/08/13--stable on repeat 07/2013, 03/2015, and 01/2016.  No additional imaging is required.   Tobacco dependence    ongoing as of 02/2016   Vitamin B12 deficiency without anemia 2016   Intrinsic factor NEG: home  vit B12 injections started 11/17/14    Past Surgical History:  Procedure Laterality Date   AMPUTATION FINGER Left 05/16/2019   Procedure: Revision Amutation of Left  fourth finger;  Surgeon: Dominica Severin, MD;  [facility]: [facility];  Service: Orthopedics;  Laterality: Left;   APPENDECTOMY  remote   Arm surgery  03/2009   Left arm cyst/Lipoma   CARDIOVASCULAR STRESS TEST  03/2017   Low risk study (likely artifact seen, but no reversible ischemia, EF 59%, no wall motion abnormality.   carotid dopplers  02/2015; 02/2016;12/2017;06/2019   2017:  R ICA 1-39%, L ICA 40-59%: no change  from 2016. 03/2017 rpt 40-59% bilat ICA stenosis. 12/2017 1-39% bilat ICA stenosis.  06/2019->same. Rpt 18 mo.   CAROTID ENDARTERECTOMY  11/25/2008   Left     ICA   COLONOSCOPY N/A 02/26/2014   Recall 3 yrs: (+ polypectomy--tubular adenoma) Procedure: COLONOSCOPY;  Surgeon: Malissa Hippo, MD;  [facility];  Service: Endoscopy;  Laterality: N/A;  830   COLONOSCOPY N/A 09/20/2017   Multiple adenomatous polyps: recall 1 yr.  Procedure: COLONOSCOPY;  Surgeon: Malissa Hippo, MD;  [facility];  Service: Endoscopy;  Laterality: N/A;  730   COLONOSCOPY N/A 11/21/2018   Procedure: COLONOSCOPY;  Surgeon: Malissa Hippo, MD;  [facility];  Service: Endoscopy;  Laterality: N/A;  930   COLONOSCOPY W/ POLYPECTOMY  09/20/2017; 11/21/18   2019 Adenomatous polyp: recall 3-5 yrs (Dr. Karilyn Cota).  11/2018-adenomatous polyp->recall 3 yrs   I & D EXTREMITY Left 05/16/2019   Procedure: IRRIGATION AND DEBRIDEMENT OF LEFT INDEX, MIDDLE,FOURTH FINGERS;  Surgeon: Dominica Severin, MD;  [facility];  Service: Orthopedics;  Laterality: Left;   NAILBED REPAIR Left 05/16/2019   Procedure: Reconstruction of bone and nail bed Left Index finger;  Surgeon: Dominica Severin, MD;  [facility];  Service: Orthopedics;  Laterality: Left;   OPEN REDUCTION INTERNAL FIXATION (ORIF) HAND Left 05/16/2019   Procedure: Reconstruction and Open Reduction and Internal fixation and Rotation flap Left Middle finger;  Surgeon: Dominica Severin, MD;  [facility];  Service: Orthopedics;  Laterality: Left;   POLYPECTOMY  09/20/2017   Procedure: POLYPECTOMY;  Surgeon: Malissa Hippo, MD;  [facility];  Service: Endoscopy;;  Ascending colon, Transverse colon, Hepatic flexure, Cecum   POLYPECTOMY  11/21/2018   Procedure: POLYPECTOMY;  Surgeon: Malissa Hippo, MD;  [facility];  Service: Endoscopy;;   TONSILLECTOMY  remote   TONSILLECTOMY     TRANSTHORACIC ECHOCARDIOGRAM  03/2012    2013 --EF 55%, normal LV syst fxn, impaired diast relaxation, mild/mod aortic regurg.   UMBILICAL HERNIA REPAIR     ZIO PATCH  04/2022   mild brady, PACs, PVCs.  No meds    Outpatient Medications Prior to Visit  Medication Sig Dispense Refill   aspirin EC 81 MG tablet Take 1 tablet (81 mg total) by mouth at bedtime.     cyanocobalamin (,VITAMIN B-12,) 1000 MCG/ML injection Inject 1,000 mcg into the muscle every 30 (thirty) days.      Omega-3 Fatty Acids (OMEGA-3 FISH OIL PO) Take by mouth.     omeprazole (PRILOSEC) 40 MG capsule TAKE 1 CAPSULE(40 MG) BY MOUTH DAILY 90 capsule 1   Probiotic Product (PROBIOTIC-10 PO)      atorvastatin (LIPITOR) 80 MG tablet TAKE 1 TABLET(80 MG) BY MOUTH DAILY 90 tablet 3   ezetimibe (ZETIA) 10 MG tablet TAKE 1 TABLET(10 MG) BY MOUTH DAILY 90  tablet 1   No facility-administered medications prior to visit.    Allergies  Allergen Reactions   Metoprolol Other (See Comments)    Fatigue    Other    Clonidine Derivatives Rash    Review of Systems As per HPI  PE:    06/15/2023    1:11 PM 06/15/2023    1:05 PM 12/06/2022    2:14 PM  Vitals with BMI  Height  5\' 9"  5\' 9"   Weight  132 lbs 137 lbs 13 oz  BMI  19.48 20.34  Systolic 132 150 376  Diastolic 62 61 71  Pulse  44 64     Physical Exam  Gen: Alert, well appearing.  Patient is oriented to person, place, time, and situation. CV: Regular, rate 50, soft syst murmur.  No diastolic murmur. Lungs: CTA bilat EXT: no edema  LABS:  Last CBC Lab Results  Component Value Date   WBC 5.9 12/06/2022   HGB 15.0 12/06/2022   HCT 44.4 12/06/2022   MCV 87.9 12/06/2022   MCH 29.4 05/16/2019   RDW 14.5 12/06/2022   PLT 177.0 12/06/2022   Last metabolic panel Lab Results  Component Value Date   GLUCOSE 92 11/21/2022   NA 137 11/21/2022   K 5.0 11/21/2022   CL 100 11/21/2022   CO2 29 11/21/2022   BUN 17 11/21/2022   CREATININE 1.21 11/21/2022   GFR 56.90 (L) 11/21/2022   CALCIUM 10.1  11/21/2022   PROT 6.7 02/23/2022   ALBUMIN 4.0 02/23/2022   BILITOT 0.6 02/23/2022   ALKPHOS 89 02/23/2022   AST 12 02/23/2022   ALT 9 02/23/2022   ANIONGAP 10 05/16/2019   Last lipids Lab Results  Component Value Date   CHOL 178 12/06/2022   HDL 45.80 12/06/2022   LDLCALC 101 (H) 12/06/2022   TRIG 154.0 (H) 12/06/2022   CHOLHDL 4 12/06/2022   Last hemoglobin A1c Lab Results  Component Value Date   HGBA1C 5.6 11/21/2022   HGBA1C 5.6 11/21/2022   HGBA1C 5.6 (A) 11/21/2022   HGBA1C 5.6 11/21/2022   Last thyroid functions Lab Results  Component Value Date   TSH 2.46 12/06/2022   Last vitamin B12 and Folate Lab Results  Component Value Date   VITAMINB12 471 12/06/2022   IMPRESSION AND PLAN:  #1 hypertension, doing well off of antihypertensive. Initial blood pressure here today was 150/61. Recheck manually later in the visit was 132/62.  2.  Hypercholesterolemia. There was concern for possible side effect from these medications so we stopped them for 6 months.  He feels no different. Restart atorvastatin 80 mg a day and Zetia 10 mg a day. Recheck lipids in 6 months.  3.  Vit B12 defic:   Most recent injection was 02/12/23. 1000 mcg IM injection here today.  4.  History of left eye CVA. He and Harriett Sine tell me the eye doctor notified him of this.  He recently got cataract surgery in the left eye. Repeat Carotid Doppler 06/08/2023 without obstructive stenosis on the left.  Relatively stable 50-69 stenosis in the right internal carotid artery with calcified plaque. Dr. Diona Browner recommended continue medical therapy. Will restart a atorvastatin 80 mg a day and Zetia 10 mg a day. Continue aspirin 81 mg a day.  #5 chronic renal insufficiency stage III. GFR was 59 when last checked about 6 months ago. Electrolytes normal. Recheck metabolic panel at next follow-up in 6 months.  An After Visit Summary was printed and given to the patient.  FOLLOW UP: No follow-ups on  file.  Signed:  Santiago Bumpers, MD           06/15/2023

## 2023-07-10 ENCOUNTER — Other Ambulatory Visit: Payer: Self-pay | Admitting: Family Medicine

## 2023-07-17 NOTE — Progress Notes (Signed)
Cardiology Office Note:  .   Date:  07/31/2023  ID:  Jeffrey [name], DOB [birth year], MRN [id number] PCP: Jeoffrey Massed, MD  [facility] Providers Cardiologist:  Nona Dell, MD    History of Present Illness: .   Jeffrey [name] is a 82 y.o. male ? CAD with large inf/inferoseptal scar on NST 2018, echo no WMA, Mild to mod AI, frequent PVC's 11% burden but asymptomatic, history of syncope possibly related to hypotension or orthostasis. Improved after stopping plendil and hydrochlorothiazide.  Patient comes in with his wife. Not very active, sleeps a lot. Has been raking leaves recently. Denies chest pain, dyspnea, palpitations,dizziness, presyncope, edema. Smoking 1/2 ppd, can't quit. Has lost 50 lb in the past year. Eats a pack of nabs in am and 1/2 sandwich for dinner. He drinks gatorade and soft drinks(4-5 daily), milk. He doesn't drink water.  ROS:    Studies Reviewed: Marland Kitchen         Prior CV Studies:   Carotid dopplers 05/2023 IMPRESSION: Right:   Heterogeneous and partially calcified plaque at the left carotid bifurcation, with discordant results regarding degree of stenosis by established duplex criteria. Peak velocity suggests 50%-69% stenosis, with the ICA/ CCA ratio suggesting a lesser degree of stenosis. Contributing to this discordance may be the elevated velocity of the common carotid artery at the site of calcified plaque, perhaps 50% stenotic at the right CCA. If establishing a more accurate degree of stenosis is required, cerebral angiogram should be considered, or as a second best test, CTA.   Left:   Surgical changes of left carotid endarterectomy, with again evidence of developing fibro intimal hyperplasia. Note that established duplex criteria have not been validated in the setting of prior carotid endarterectomy, however, velocities indicate no significant stenosis.   Echo 04/2022 IMPRESSIONS     1. Left ventricular ejection fraction, by  estimation, is 70 to 75%. The  left ventricle has hyperdynamic function. The left ventricle has no  regional wall motion abnormalities. There is mild concentric left  ventricular hypertrophy. Left ventricular  diastolic parameters are consistent with Grade I diastolic dysfunction  (impaired relaxation). The average left ventricular global longitudinal  strain is -21.1 %. The global longitudinal strain is normal.   2. Right ventricular systolic function is normal. The right ventricular  size is normal. There is normal pulmonary artery systolic pressure. The  estimated right ventricular systolic pressure is 21.5 mmHg.   3. The mitral valve is grossly normal. Mild mitral valve regurgitation.   4. The aortic valve is tricuspid. There is moderate calcification of the  aortic valve. Aortic valve regurgitation is mild to moderate. Aortic valve  sclerosis/calcification is present, without any evidence of aortic  stenosis.   5. The inferior vena cava is normal in size with greater than 50%  respiratory variability, suggesting right atrial pressure of 3 mmHg.   Comparison(s): Prior images unable to be directly viewed.    Risk Assessment/Calculations:             Physical Exam:   VS:  BP 132/76   Pulse (!) 53   Ht 5\' 9"  (1.753 m)   Wt 131 lb 12.8 oz (59.8 kg)   SpO2 91%   BMI 19.46 kg/m    Wt Readings from Last 3 Encounters:  07/31/23 131 lb 12.8 oz (59.8 kg)  06/15/23 132 lb (59.9 kg)  12/06/22 137 lb 12.8 oz (62.5 kg)    GEN: Thin, in no acute distress NECK: No  JVD; No carotid bruits CARDIAC: RRR, 2/6 diastolic murmur LSB RESPIRATORY:  Decreased breath sounds throughout  ABDOMEN: Soft, non-tender, non-distended EXTREMITIES:  No edema; No deformity   ASSESSMENT AND PLAN: .     Ischemic heart disease based on Myoview from 2018 showing large region of inferior/inferoseptal scar.  Follow-up echocardiogram in August 2023 showed LVEF 70 to 75% without regional wall motion  abnormalities.  No active angina with current level of activity.  Continue aspirin, Lipitor, and Zetia.   Mild to moderate aortic regurgitation with aortic valve sclerosis at baseline.  He is asymptomatic.   Frequent PVCs, approximately 11% burden by cardiac monitor in August 2023.  No definite association with symptoms or evidence of cardiomyopathy.  Holding off on beta-blocker for now. No palpitations    Asymptomatic carotid artery disease, Carotid stenosis 05/2023 50-69%on right, left, prior LCEA with intimal hyperplasia-smoking cessation discussed. LDL 101 11/2022. Repeat today   History of syncope, possibly related to relative hypotension or orthostasis and improved after discontinuation of both Plendil and HCTZ by PCP.  Blood pressure controlled today.    Carotid stenosis 05/2023 50-69%on right, left, prior LCEA with intimal hyperplasia. Smoking cessation. Continue ASA, lipitor and zetia  HLD LDL 101 11/2022-repeat today. Goal less than 70. May need to change atorvastatin  Weight loss of 50 lbsin 1 yr unintentional-no appetite. Recommend he f/u with PCP for this.  Tobacco abuse-smoking cessation discussed         Dispo: f/u in 6 months  Signed, Jacolyn Reedy, PA-C

## 2023-07-31 ENCOUNTER — Ambulatory Visit: Payer: Medicare PPO | Attending: Physician Assistant | Admitting: Physician Assistant

## 2023-07-31 ENCOUNTER — Encounter: Payer: Self-pay | Admitting: Physician Assistant

## 2023-07-31 ENCOUNTER — Other Ambulatory Visit (HOSPITAL_COMMUNITY)
Admission: RE | Admit: 2023-07-31 | Discharge: 2023-07-31 | Disposition: A | Discharge status: Home / Self Care | Payer: Medicare PPO | Source: Ambulatory Visit | Attending: Physician Assistant | Admitting: Physician Assistant

## 2023-07-31 VITALS — BP 132/76 | HR 53 | Ht 69.0 in | Wt 131.8 lb

## 2023-07-31 DIAGNOSIS — I351 Nonrheumatic aortic (valve) insufficiency: Secondary | ICD-10-CM | POA: Diagnosis not present

## 2023-07-31 DIAGNOSIS — Z720 Tobacco use: Secondary | ICD-10-CM | POA: Diagnosis not present

## 2023-07-31 DIAGNOSIS — I6529 Occlusion and stenosis of unspecified carotid artery: Secondary | ICD-10-CM

## 2023-07-31 DIAGNOSIS — Z87898 Personal history of other specified conditions: Secondary | ICD-10-CM | POA: Insufficient documentation

## 2023-07-31 DIAGNOSIS — E785 Hyperlipidemia, unspecified: Secondary | ICD-10-CM | POA: Insufficient documentation

## 2023-07-31 DIAGNOSIS — R634 Abnormal weight loss: Secondary | ICD-10-CM | POA: Diagnosis not present

## 2023-07-31 DIAGNOSIS — N182 Chronic kidney disease, stage 2 (mild): Secondary | ICD-10-CM

## 2023-07-31 DIAGNOSIS — I10 Essential (primary) hypertension: Secondary | ICD-10-CM | POA: Insufficient documentation

## 2023-07-31 DIAGNOSIS — I259 Chronic ischemic heart disease, unspecified: Secondary | ICD-10-CM

## 2023-07-31 DIAGNOSIS — I493 Ventricular premature depolarization: Secondary | ICD-10-CM | POA: Diagnosis not present

## 2023-07-31 LAB — COMPREHENSIVE METABOLIC PANEL
ALT: 28 U/L (ref 0–44)
AST: 25 U/L (ref 15–41)
Albumin: 4.2 g/dL (ref 3.5–5.0)
Alkaline Phosphatase: 75 U/L (ref 38–126)
Anion gap: 8 (ref 5–15)
BUN: 15 mg/dL (ref 8–23)
CO2: 26 mmol/L (ref 22–32)
Calcium: 9.3 mg/dL (ref 8.9–10.3)
Chloride: 103 mmol/L (ref 98–111)
Creatinine, Ser: 1.06 mg/dL (ref 0.61–1.24)
GFR, Estimated: >60 mL/min (ref >60)
Glucose, Bld: 107 mg/dL — ABNORMAL HIGH (ref 70–99)
Potassium: 4.4 mmol/L (ref 3.5–5.1)
Sodium: 137 mmol/L (ref 135–145)
Total Bilirubin: 0.9 mg/dL (ref <1.2)
Total Protein: 7.4 g/dL (ref 6.5–8.1)

## 2023-07-31 LAB — CBC
HCT: 43.8 % (ref 39.0–52.0)
Hemoglobin: 14.5 g/dL (ref 13.0–17.0)
MCH: 30.2 pg (ref 26.0–34.0)
MCHC: 33.1 g/dL (ref 30.0–36.0)
MCV: 91.3 fL (ref 80.0–100.0)
Platelets: 159 10*3/uL (ref 150–400)
RBC: 4.8 MIL/uL (ref 4.22–5.81)
RDW: 13.2 % (ref 11.5–15.5)
WBC: 5.7 10*3/uL (ref 4.0–10.5)
nRBC: 0 % (ref 0.0–0.2)

## 2023-07-31 LAB — LIPID PANEL
Cholesterol: 106 mg/dL (ref 0–200)
HDL: 43 mg/dL (ref >40)
LDL Cholesterol: 53 mg/dL (ref 0–99)
Total CHOL/HDL Ratio: 2.5 {ratio}
Triglycerides: 50 mg/dL (ref <150)
VLDL: 10 mg/dL (ref 0–40)

## 2023-07-31 LAB — TSH: TSH: 1.473 u[IU]/mL (ref 0.350–4.500)

## 2023-07-31 NOTE — Patient Instructions (Signed)
Medication Instructions:  Your physician recommends that you continue on your current medications as directed. Please refer to the Current Medication list given to you today.  *If you need a refill on your cardiac medications before your next appointment, please call your pharmacy*   Lab Work: Your physician recommends that you return for lab work in: Today   If you have labs (blood work) drawn today and your tests are completely normal, you will receive your results only by: MyChart Message (if you have MyChart) OR A paper copy in the mail If you have any lab test that is abnormal or we need to change your treatment, we will call you to review the results.   Testing/Procedures: NONE    Follow-Up: At [facility], you and your health needs are our priority.  As part of our continuing mission to provide you with exceptional heart care, we have created designated Provider Care Teams.  These Care Teams include your primary Cardiologist (physician) and Advanced Practice Providers (APPs -  Physician Assistants and Nurse Practitioners) who all work together to provide you with the care you need, when you need it.  We recommend signing up for the patient portal called "MyChart".  Sign up information is provided on this After Visit Summary.  MyChart is used to connect with patients for Virtual Visits (Telemedicine).  Patients are able to view lab/test results, encounter notes, upcoming appointments, etc.  Non-urgent messages can be sent to your provider as well.   To learn more about what you can do with MyChart, go to [email or web address].    Your next appointment:   6 month(s)  Provider:   You may see Nona Dell, MD or one of the following Advanced Practice Providers on your designated Care Team:   Randall An, PA-C  Jacolyn Reedy, PA-C     Other Instructions Thank you for choosing [facility]!    Managing the Challenge of Quitting Smoking Quitting  smoking is a physical and mental challenge. You may have cravings, withdrawal symptoms, and temptation to smoke. Before quitting, work with your health care provider to make a plan that can help you manage quitting. Making a plan before you quit may keep you from smoking when you have the urge to smoke while trying to quit. How to manage lifestyle changes Managing stress Stress can make you want to smoke, and wanting to smoke may cause stress. It is important to find ways to manage your stress. You could try some of the following: Practice relaxation techniques. Breathe slowly and deeply, in through your nose and out through your mouth. Listen to music. Soak in a bath or take a shower. Imagine a peaceful place or vacation. Get some support. Talk with family or friends about your stress. Join a support group. Talk with a counselor or therapist. Get some physical activity. Go for a walk, run, or bike ride. Play a favorite sport. Practice yoga.  Medicines Talk with your health care provider about medicines that might help you deal with cravings and make quitting easier for you. Relationships Social situations can be difficult when you are quitting smoking. To manage this, you can: Avoid parties and other social situations where people might be smoking. Avoid alcohol. Leave right away if you have the urge to smoke. Explain to your family and friends that you are quitting smoking. Ask for support and let them know you might be a bit grumpy. Plan activities where smoking is not an option. General instructions Be  aware that many people gain weight after they quit smoking. However, not everyone does. To keep from gaining weight, have a plan in place before you quit, and stick to the plan after you quit. Your plan should include: Eating healthy snacks. When you have a craving, it may help to: Eat popcorn, or try carrots, celery, or other cut vegetables. Chew sugar-free gum. Changing how you  eat. Eat small portion sizes at meals. Eat 4-6 small meals throughout the day instead of 1-2 large meals a day. Be mindful when you eat. You should avoid watching television or doing other things that might distract you as you eat. Exercising regularly. Make time to exercise each day. If you do not have time for a long workout, do short bouts of exercise for 5-10 minutes several times a day. Do some form of strengthening exercise, such as weight lifting. Do some exercise that gets your heart beating and causes you to breathe deeply, such as walking fast, running, swimming, or biking. This is very important. Drinking plenty of water or other low-calorie or no-calorie drinks. Drink enough fluid to keep your urine pale yellow.  How to recognize withdrawal symptoms Your body and mind may experience discomfort as you try to get used to not having nicotine in your system. These effects are called withdrawal symptoms. They may include: Feeling hungrier than normal. Having trouble concentrating. Feeling irritable or restless. Having trouble sleeping. Feeling depressed. Craving a cigarette. These symptoms may surprise you, but they are normal to have when quitting smoking. To manage withdrawal symptoms: Avoid places, people, and activities that trigger your cravings. Remember why you want to quit. Get plenty of sleep. Avoid coffee and other drinks that contain caffeine. These may worsen some of your symptoms. How to manage cravings Come up with a plan for how to deal with your cravings. The plan should include the following: A definition of the specific situation you want to deal with. An activity or action you will take to replace smoking. A clear idea for how this action will help. The name of someone who could help you with this. Cravings usually last for 5-10 minutes. Consider taking the following actions to help you with your plan to deal with cravings: Keep your mouth busy. Chew  sugar-free gum. Suck on hard candies or a straw. Brush your teeth. Keep your hands and body busy. Change to a different activity right away. Squeeze or play with a ball. Do an activity or a hobby, such as making bead jewelry, practicing needlepoint, or working with wood. Mix up your normal routine. Take a short exercise break. Go for a quick walk, or run up and down stairs. Focus on doing something kind or helpful for someone else. Call a friend or family member to talk during a craving. Join a support group. Contact a quitline. Where to find support To get help or find a support group: Call the National Cancer Institute's Smoking Quitline: 1-800-QUIT-NOW 409 641 3728) Text QUIT to SmokefreeTXT: 811914 Where to find more information Visit these websites to find more information on quitting smoking: U.S. Department of Health and Human Services: www.smokefree.gov American Lung Association: www.freedomfromsmoking.org Centers for Disease Control and Prevention (CDC): FootballExhibition.com.br American Heart Association: www.heart.org Contact a health care provider if: You want to change your plan for quitting. The medicines you are taking are not helping. Your eating feels out of control or you cannot sleep. You feel depressed or become very anxious. Summary Quitting smoking is a physical and mental challenge.  You will face cravings, withdrawal symptoms, and temptation to smoke again. Preparation can help you as you go through these challenges. Try different techniques to manage stress, handle social situations, and prevent weight gain. You can deal with cravings by keeping your mouth busy (such as by chewing gum), keeping your hands and body busy, calling family or friends, or contacting a quitline for people who want to quit smoking. You can deal with withdrawal symptoms by avoiding places where people smoke, getting plenty of rest, and avoiding drinks that contain caffeine. This information is not  intended to replace advice given to you by your health care provider. Make sure you discuss any questions you have with your health care provider. Document Revised: 08/19/2021 Document Reviewed: 08/19/2021 Elsevier Patient Education  2024 [employer].

## 2023-10-26 ENCOUNTER — Encounter (INDEPENDENT_AMBULATORY_CARE_PROVIDER_SITE_OTHER): Payer: Self-pay | Admitting: *Deleted

## 2023-11-21 DIAGNOSIS — Z01818 Encounter for other preprocedural examination: Secondary | ICD-10-CM | POA: Diagnosis not present

## 2023-11-21 DIAGNOSIS — H2511 Age-related nuclear cataract, right eye: Secondary | ICD-10-CM | POA: Diagnosis not present

## 2023-12-14 DIAGNOSIS — I10 Essential (primary) hypertension: Secondary | ICD-10-CM | POA: Diagnosis not present

## 2023-12-14 DIAGNOSIS — H25811 Combined forms of age-related cataract, right eye: Secondary | ICD-10-CM | POA: Diagnosis not present

## 2023-12-14 DIAGNOSIS — H2511 Age-related nuclear cataract, right eye: Secondary | ICD-10-CM | POA: Diagnosis not present

## 2024-01-16 ENCOUNTER — Encounter: Payer: Self-pay | Admitting: Family Medicine

## 2024-01-16 ENCOUNTER — Ambulatory Visit: Admitting: Family Medicine

## 2024-01-16 VITALS — BP 122/66 | HR 72 | Temp 98.5°F | Ht 67.5 in | Wt 136.6 lb

## 2024-01-16 DIAGNOSIS — E7800 Pure hypercholesterolemia, unspecified: Secondary | ICD-10-CM | POA: Diagnosis not present

## 2024-01-16 DIAGNOSIS — I10 Essential (primary) hypertension: Secondary | ICD-10-CM

## 2024-01-16 DIAGNOSIS — R7303 Prediabetes: Secondary | ICD-10-CM

## 2024-01-16 DIAGNOSIS — E538 Deficiency of other specified B group vitamins: Secondary | ICD-10-CM

## 2024-01-16 DIAGNOSIS — Z0000 Encounter for general adult medical examination without abnormal findings: Secondary | ICD-10-CM | POA: Diagnosis not present

## 2024-01-16 DIAGNOSIS — N1831 Chronic kidney disease, stage 3a: Secondary | ICD-10-CM

## 2024-01-16 LAB — COMPREHENSIVE METABOLIC PANEL WITH GFR
ALT: 8 U/L (ref 0–53)
AST: 12 U/L (ref 0–37)
Albumin: 4.3 g/dL (ref 3.5–5.2)
Alkaline Phosphatase: 74 U/L (ref 39–117)
BUN: 14 mg/dL (ref 6–23)
CO2: 31 meq/L (ref 19–32)
Calcium: 9.7 mg/dL (ref 8.4–10.5)
Chloride: 103 meq/L (ref 96–112)
Creatinine, Ser: 1.26 mg/dL (ref 0.40–1.50)
GFR: 53.77 mL/min — ABNORMAL LOW (ref >60.00)
Glucose, Bld: 102 mg/dL — ABNORMAL HIGH (ref 70–99)
Potassium: 4.7 meq/L (ref 3.5–5.1)
Sodium: 141 meq/L (ref 135–145)
Total Bilirubin: 0.9 mg/dL (ref 0.2–1.2)
Total Protein: 6.5 g/dL (ref 6.0–8.3)

## 2024-01-16 LAB — LIPID PANEL
Cholesterol: 115 mg/dL (ref 0–200)
HDL: 40.9 mg/dL (ref >39.00)
LDL Cholesterol: 56 mg/dL (ref 0–99)
NonHDL: 73.77
Total CHOL/HDL Ratio: 3
Triglycerides: 91 mg/dL (ref 0.0–149.0)
VLDL: 18.2 mg/dL (ref 0.0–40.0)

## 2024-01-16 LAB — CBC WITH DIFFERENTIAL/PLATELET
Basophils Absolute: 0 10*3/uL (ref 0.0–0.1)
Basophils Relative: 0.7 % (ref 0.0–3.0)
Eosinophils Absolute: 0.1 10*3/uL (ref 0.0–0.7)
Eosinophils Relative: 1.8 % (ref 0.0–5.0)
HCT: 38.9 % — ABNORMAL LOW (ref 39.0–52.0)
Hemoglobin: 13.2 g/dL (ref 13.0–17.0)
Lymphocytes Relative: 23.8 % (ref 12.0–46.0)
Lymphs Abs: 1.1 10*3/uL (ref 0.7–4.0)
MCHC: 34 g/dL (ref 30.0–36.0)
MCV: 89.1 fl (ref 78.0–100.0)
Monocytes Absolute: 0.4 10*3/uL (ref 0.1–1.0)
Monocytes Relative: 8.5 % (ref 3.0–12.0)
Neutro Abs: 3 10*3/uL (ref 1.4–7.7)
Neutrophils Relative %: 65.2 % (ref 43.0–77.0)
Platelets: 168 10*3/uL (ref 150.0–400.0)
RBC: 4.36 Mil/uL (ref 4.22–5.81)
RDW: 13.7 % (ref 11.5–15.5)
WBC: 4.5 10*3/uL (ref 4.0–10.5)

## 2024-01-16 LAB — HEMOGLOBIN A1C: Hgb A1c MFr Bld: 5.5 % (ref 4.6–6.5)

## 2024-01-16 LAB — VITAMIN B12: Vitamin B-12: 1328 pg/mL — ABNORMAL HIGH (ref 211–911)

## 2024-01-16 MED ORDER — OMEPRAZOLE 40 MG PO CPDR: DELAYED_RELEASE_CAPSULE | ORAL | 3 refills | Status: AC

## 2024-01-16 MED ORDER — EZETIMIBE 10 MG PO TABS: ORAL_TABLET | ORAL | 3 refills | Status: AC

## 2024-01-16 MED ORDER — ATORVASTATIN CALCIUM 80 MG PO TABS: ORAL_TABLET | ORAL | 3 refills | Status: DC

## 2024-01-16 NOTE — Progress Notes (Signed)
 Office Note 01/16/2024  CC:  Chief Complaint  Patient presents with   Annual Exam    Pt is fasting   Patient is a 83 y.o. male who is here for annual health maintenance exam and 69-month follow-up prediabetes, chronic renal insufficiency, and hypercholesterolemia. A/P as of last visit: "#1 hypertension, doing well off of antihypertensive. Initial blood pressure here today was 150/61. Recheck manually later in the visit was 132/62.   2.  Hypercholesterolemia. There was concern for possible side effect from these medications so we stopped them for 6 months.  He feels no different. Restart atorvastatin  80 mg a day and Zetia  10 mg a day. Recheck lipids in 6 months.   3.  Vit B12 defic:   Most recent injection was 02/12/23. 1000 mcg IM injection here today.   4.  History of left eye CVA. He and Haskell Linker tell me the eye doctor notified him of this.  He recently got cataract surgery in the left eye. Repeat Carotid Doppler 06/08/2023 without obstructive stenosis on the left.  Relatively stable 50-69 stenosis in the right internal carotid artery with calcified plaque. Dr. Londa Rival recommended continue medical therapy. Will restart a atorvastatin  80 mg a day and Zetia  10 mg a day. Continue aspirin 81 mg a day.   #5 chronic renal insufficiency stage III. GFR was 59 when last checked about 6 months ago. Electrolytes normal. Recheck metabolic panel at next follow-up in 6 months."  INTERIM HX: Doing well. He quit smoking 6 months ago!  He is compliant with his medications.    Past Medical History:  Diagnosis Date   Adenomatous colon polyp    Dr. Homero Luster (28 polyps on 1st endo, 3 on 2nd endo a year later.  8 on colonoscopy 02/26/14--recall 3 yrs.  Multiple adenomatous polyps on 09/20/17 TCS--recall 1 yr.. 11/2018 +aden pol->recall 3 yrs.   Aortic insufficiency    Mild to moderate 2013   BPH (benign prostatic hyperplasia)    Dr. Harriette Limerick (pt is asymptomatic)   Carotid artery disease (HCC)     Left CEA   Chronic renal insufficiency, stage 3 (moderate) (HCC)    DDD (degenerative disc disease), lumbar 02/2013   with spondylosis and moderate spinal stenosis+ left L5 and S1 nerve root impingement.  04/2022 left S1 root impingement   Ectatic abdominal aorta (HCC) 08/08/2016   2.8 cm at widest point: repeat aortic u/s 08/2021.   Essential tremor    Fatty liver 2006   Noted on abd u/s and renal u/s 2006/2007   Frequent PVCs    no meds as of 04/2022 per cardiology   History of hyperkalemia 02/2017   mild; recommended pt cut his ARB in half.   History of MI (myocardial infarction)    Large fixed inferior defect on myocardial perfusion imaging 03/2017.   Hyperlipidemia    Dr. Lavonne Prairie started statin 03/2017.     Hypertension    PAD (peripheral artery disease) (HCC)    carotids: carotid endarterectomy 2010.  Dr. Lavonne Prairie started pt on statin for CV risk reduction 03/2017   Plantar wart of left foot 12/21/2012   Prediabetes 2019   A1c 5.9%   Solitary pulmonary nodule 03/14/2013   7mm RUL 03/08/13--stable on repeat 07/2013, 03/2015, and 01/2016.  No additional imaging is required.   Tobacco dependence    ongoing as of 02/2016   Vitamin B12 deficiency without anemia 2016   Intrinsic factor NEG: home vit B12 injections started 11/17/14    Past Surgical History:  Procedure Laterality Date   AMPUTATION FINGER Left 05/16/2019   Procedure: Revision Amutation of Left  fourth finger;  Surgeon: Ronn Cohn, MD;  Location: [facility];  Service: Orthopedics;  Laterality: Left;   APPENDECTOMY  remote   Arm surgery  03/2009   Left arm cyst/Lipoma   CARDIOVASCULAR STRESS TEST  03/2017   Low risk study (likely artifact seen, but no reversible ischemia, EF 59%, no wall motion abnormality.   carotid dopplers  02/2015; 02/2016;12/2017;06/2019   2017:  R ICA 1-39%, L ICA 40-59%: no change from 2016. 03/2017 rpt 40-59% bilat ICA stenosis. 12/2017 1-39% bilat ICA stenosis.  06/2019->same. Rpt 18 mo.   CAROTID  ENDARTERECTOMY  11/25/2008   Left     ICA   COLONOSCOPY N/A 02/26/2014   Recall 3 yrs: (+ polypectomy--tubular adenoma) Procedure: COLONOSCOPY;  Surgeon: Ruby Corporal, MD;  Location: [facility];  Service: Endoscopy;  Laterality: N/A;  830   COLONOSCOPY N/A 09/20/2017   Multiple adenomatous polyps: recall 1 yr.  Procedure: COLONOSCOPY;  Surgeon: Ruby Corporal, MD;  Location: [facility];  Service: Endoscopy;  Laterality: N/A;  730   COLONOSCOPY N/A 11/21/2018   Procedure: COLONOSCOPY;  Surgeon: Ruby Corporal, MD;  [facility]: [facility];  Service: Endoscopy;  Laterality: N/A;  930   COLONOSCOPY W/ POLYPECTOMY  09/20/2017; 11/21/18   2019 Adenomatous polyp: recall 3-5 yrs (Dr. Homero Luster).  11/2018-adenomatous polyp->recall 3 yrs   I & D EXTREMITY Left 05/16/2019   Procedure: IRRIGATION AND DEBRIDEMENT OF LEFT INDEX, MIDDLE,FOURTH FINGERS;  Surgeon: Ronn Cohn, MD;  Location: [facility];  Service: Orthopedics;  Laterality: Left;   NAILBED REPAIR Left 05/16/2019   Procedure: Reconstruction of bone and nail bed Left Index finger;  Surgeon: Ronn Cohn, MD;  Location: [facility];  Service: Orthopedics;  Laterality: Left;   OPEN REDUCTION INTERNAL FIXATION (ORIF) HAND Left 05/16/2019   Procedure: Reconstruction and Open Reduction and Internal fixation and Rotation flap Left Middle finger;  Surgeon: Ronn Cohn, MD;  Location: [facility];  Service: Orthopedics;  Laterality: Left;   POLYPECTOMY  09/20/2017   Procedure: POLYPECTOMY;  Surgeon: Ruby Corporal, MD;  [facility];  Service: Endoscopy;;  Ascending colon, Transverse colon, Hepatic flexure, Cecum   POLYPECTOMY  11/21/2018   Procedure: POLYPECTOMY;  Surgeon: Ruby Corporal, MD;  [facility]: [facility];  Service: Endoscopy;;   TONSILLECTOMY  remote   TONSILLECTOMY     TRANSTHORACIC ECHOCARDIOGRAM  03/2012   2013 --EF 55%, normal LV syst fxn, impaired diast relaxation, mild/mod aortic regurg.   UMBILICAL HERNIA REPAIR      ZIO PATCH  04/2022   mild brady, PACs, PVCs.  No meds    Family History  Problem Relation Age of Onset   Coronary artery disease Mother    Stroke Mother    Diabetes Mother    Hypertension Mother    Parkinson's disease Mother    Stroke Sister    Cancer Brother    Heart disease Brother    Colon cancer Neg Hx     Social History   Socioeconomic History   Marital status: Married    Spouse name: Not on file   Number of children: Not on file   Years of education: Not on file   Highest education level: 9th grade  Occupational History   Occupation: retired    Comment: Psychologist, counselling  Tobacco Use   Smoking status: Former    Current packs/day: 0.00  Types: Cigarettes    Quit date: 08/2023    Years since quitting: 0.4   Smokeless tobacco: Never  Vaping Use   Vaping status: Never Used  Substance and Sexual Activity   Alcohol use: No    Alcohol/week: 0.0 standard drinks of alcohol   Drug use: No   Sexual activity: Not on file  Other Topics Concern   Not on file  Social History Narrative   Married, 2 children.   Orig from [place].   Retired from [employer].   Current smoker; 1 ppd (x 50 yrs).  No hx alc, no drugs.   Active person, no formal exercise.   Social Drivers of Corporate investment banker Strain: Low Risk  (01/14/2024)   Overall Financial Resource Strain (CARDIA)    Difficulty of Paying Living Expenses: Not very hard  Food Insecurity: No Food Insecurity (01/14/2024)   Hunger Vital Sign    Worried About Running Out of Food in the Last Year: Never true    Ran Out of Food in the Last Year: Never true  Transportation Needs: No Transportation Needs (01/14/2024)   PRAPARE - Administrator, Civil Service (Medical): No    Lack of Transportation (Non-Medical): No  Physical Activity: Unknown (01/14/2024)   Exercise Vital Sign    Days of Exercise per Week: 0 days    Minutes of Exercise per Session: Not on file  Stress: No Stress Concern  Present (01/14/2024)   [name] of Occupational Health - Occupational Stress Questionnaire    Feeling of Stress : Only a little  Social Connections: Socially Isolated (01/14/2024)   Social Connection and Isolation Panel [NHANES]    Frequency of Communication with Friends and Family: Never    Frequency of Social Gatherings with Friends and Family: Once a week    Attends Religious Services: Never    Database administrator or Organizations: No    Attends Engineer, structural: Not on file    Marital Status: Married  Catering manager Violence: Not At Risk (12/06/2022)   Humiliation, Afraid, Rape, and Kick questionnaire    Fear of Current or Ex-Partner: No    Emotionally Abused: No    Physically Abused: No    Sexually Abused: No    Outpatient Medications Prior to Visit  Medication Sig Dispense Refill   aspirin EC 81 MG tablet Take 1 tablet (81 mg total) by mouth at bedtime.     atorvastatin  (LIPITOR) 80 MG tablet TAKE 1 TABLET(80 MG) BY MOUTH DAILY 90 tablet 3   cyanocobalamin  (,VITAMIN B-12,) 1000 MCG/ML injection Inject 1,000 mcg into the muscle every 30 (thirty) days.      ezetimibe  (ZETIA ) 10 MG tablet TAKE 1 TABLET(10 MG) BY MOUTH DAILY 90 tablet 3   Omega-3 Fatty Acids (OMEGA-3 FISH OIL PO) Take by mouth.     omeprazole  (PRILOSEC) 40 MG capsule TAKE 1 CAPSULE(40 MG) BY MOUTH DAILY 90 capsule 1   Probiotic Product (PROBIOTIC-10 PO)      No facility-administered medications prior to visit.    Allergies  Allergen Reactions   Metoprolol  Other (See Comments)    Fatigue    Other    Clonidine  Derivatives Rash    Review of Systems  Constitutional:  Negative for appetite change, chills, fatigue and fever.  HENT:  Negative for congestion, dental problem, ear pain and sore throat.   Eyes:  Negative for discharge, redness and visual disturbance.  Respiratory:  Negative for cough, chest tightness,  shortness of breath and wheezing.   Cardiovascular:  Negative for chest  pain, palpitations and leg swelling.  Gastrointestinal:  Negative for abdominal pain, blood in stool, diarrhea, nausea and vomiting.  Genitourinary:  Negative for difficulty urinating, dysuria, flank pain, frequency, hematuria and urgency.  Musculoskeletal:  Negative for arthralgias, back pain, joint swelling, myalgias and neck stiffness.  Skin:  Negative for pallor and rash.  Neurological:  Negative for dizziness, speech difficulty, weakness and headaches.  Hematological:  Negative for adenopathy. Does not bruise/bleed easily.  Psychiatric/Behavioral:  Negative for confusion and sleep disturbance. The patient is not nervous/anxious.     PE;    01/16/2024    1:03 PM 07/31/2023    1:43 PM 06/15/2023    1:11 PM  Vitals with BMI  Height 5' 7.5" 5\' 9"    Weight 136 lbs 10 oz 131 lbs 13 oz   BMI 21.07 19.45   Systolic 122 132 161  Diastolic 66 76 62  Pulse 72 53      Gen: Alert, well appearing.  Patient is oriented to person, place, time, and situation. AFFECT: pleasant, lucid thought and speech. ENT: Ears: EACs clear, normal epithelium.  TMs with good light reflex and landmarks bilaterally.  Eyes: no injection, icteris, swelling, or exudate.  EOMI, PERRLA. Nose: no drainage or turbinate edema/swelling.  No injection or focal lesion.  Mouth: lips without lesion/swelling.  Oral mucosa pink and moist.  Dentition intact and without obvious caries or gingival swelling.  Oropharynx without erythema, exudate, or swelling.  Neck: supple/nontender.  No LAD, mass, or TM.  Carotid pulses 2+ bilaterally, without bruits. CV: RRR, no m/r/g.   LUNGS: CTA bilat, nonlabored resps, good aeration in all lung fields. ABD: soft, NT, ND, BS normal.  No hepatospenomegaly or mass.  No bruits. EXT: no clubbing, cyanosis, or edema.  Musculoskeletal: no joint swelling, erythema, warmth, or tenderness.  ROM of all joints intact. Skin - no sores or suspicious lesions or rashes or color changes  Pertinent labs:   Lab Results  Component Value Date   TSH 1.473 07/31/2023   Lab Results  Component Value Date   WBC 5.7 07/31/2023   HGB 14.5 07/31/2023   HCT 43.8 07/31/2023   MCV 91.3 07/31/2023   PLT 159 07/31/2023   Lab Results  Component Value Date   CREATININE 1.06 07/31/2023   BUN 15 07/31/2023   NA 137 07/31/2023   K 4.4 07/31/2023   CL 103 07/31/2023   CO2 26 07/31/2023   Lab Results  Component Value Date   ALT 28 07/31/2023   AST 25 07/31/2023   ALKPHOS 75 07/31/2023   BILITOT 0.9 07/31/2023   Lab Results  Component Value Date   CHOL 106 07/31/2023   Lab Results  Component Value Date   HDL 43 07/31/2023   Lab Results  Component Value Date   LDLCALC 53 07/31/2023   Lab Results  Component Value Date   TRIG 50 07/31/2023   Lab Results  Component Value Date   CHOLHDL 2.5 07/31/2023   Lab Results  Component Value Date   PSA 0.85 12/23/2012   Lab Results  Component Value Date   HGBA1C 5.6 11/21/2022   HGBA1C 5.6 11/21/2022   HGBA1C 5.6 (A) 11/21/2022   HGBA1C 5.6 11/21/2022   ASSESSMENT AND PLAN:   #1 health maintenance exam: Reviewed age and gender appropriate health maintenance issues (prudent diet, regular exercise, health risks of tobacco and excessive alcohol, use of seatbelts, fire alarms in home,  use of sunscreen).  Also reviewed age and gender appropriate health screening as well as vaccine recommendations. Vaccines: All up-to-date Labs: CBC, c-Met, lipid panel, hemoglobin A1c, vitamin B12. Prostate ca screening: No longer indicated due to age Colon ca screening: No longer indicated due to age  #2 hypercholesterolemia.  Goal LDL less than 70. LDL was 53 about 6 months ago. Continue atorvastatin  80 mg a day and Zetia  10 mg a day. Fasting lipid panel and hepatic panel today.  #3 chronic renal insufficiency stage II/III. Renal function and electrolytes today.  4.  Vit B12 defic:   Most recent injection was 06/15/23. Vitamin B-12 level  today.  5. Prediabetes. Hba1c today.   Congrats on stopping smoking!!  An After Visit Summary was printed and given to the patient.  FOLLOW UP:  No follow-ups on file.  Signed:  Arletha Lady, MD           01/16/2024

## 2024-01-17 ENCOUNTER — Encounter: Payer: Self-pay | Admitting: Family Medicine

## 2024-01-24 ENCOUNTER — Encounter: Admitting: Family Medicine

## 2024-01-31 ENCOUNTER — Other Ambulatory Visit: Payer: Self-pay | Admitting: Family Medicine

## 2024-02-27 ENCOUNTER — Ambulatory Visit: Payer: Medicare PPO | Admitting: Cardiology

## 2024-02-27 ENCOUNTER — Ambulatory Visit (INDEPENDENT_AMBULATORY_CARE_PROVIDER_SITE_OTHER): Admitting: *Deleted

## 2024-02-27 DIAGNOSIS — Z0000 Encounter for general adult medical examination without abnormal findings: Secondary | ICD-10-CM | POA: Diagnosis not present

## 2024-02-27 NOTE — Patient Instructions (Signed)
 Mr. Jeffrey [name] , Thank you for taking time to come for your Medicare Wellness Visit. I appreciate your ongoing commitment to your health goals. Please review the following plan we discussed and let me know if I can assist you in the future.   Screening recommendations/referrals: Colonoscopy: no longer required Recommended yearly ophthalmology/optometry visit for glaucoma screening and checkup Recommended yearly dental visit for hygiene and checkup  Vaccinations: Influenza vaccine:  Pneumococcal vaccine:  Tdap vaccine:  Shingles vaccine:      Preventive Care 81 Years and Older, Male Preventive care refers to lifestyle choices and visits with your health care provider that can promote health and wellness. What does preventive care include? A yearly physical exam. This is also called an annual well check. Dental exams once or twice a year. Routine eye exams. Ask your health care provider how often you should have your eyes checked. Personal lifestyle choices, including: Daily care of your teeth and gums. Regular physical activity. Eating a healthy diet. Avoiding tobacco and drug use. Limiting alcohol use. Practicing safe sex. Taking low doses of aspirin every day. Taking vitamin and mineral supplements as recommended by your health care provider. What happens during an annual well check? The services and screenings done by your health care provider during your annual well check will depend on your age, overall health, lifestyle risk factors, and family history of disease. Counseling  Your health care provider may ask you questions about your: Alcohol use. Tobacco use. Drug use. Emotional well-being. Home and relationship well-being. Sexual activity. Eating habits. History of falls. Memory and ability to understand (cognition). Work and work Astronomer. Screening  You may have the following tests or measurements: Height, weight, and BMI. Blood pressure. Lipid and cholesterol  levels. These may be checked every 5 years, or more frequently if you are over 65 years old. Skin check. Lung cancer screening. You may have this screening every year starting at age 81 if you have a 30-pack-year history of smoking and currently smoke or have quit within the past 15 years. Fecal occult blood test (FOBT) of the stool. You may have this test every year starting at age 81. Flexible sigmoidoscopy or colonoscopy. You may have a sigmoidoscopy every 5 years or a colonoscopy every 10 years starting at age 81. Prostate cancer screening. Recommendations will vary depending on your family history and other risks. Hepatitis C blood test. Hepatitis B blood test. Sexually transmitted disease (STD) testing. Diabetes screening. This is done by checking your blood sugar (glucose) after you have not eaten for a while (fasting). You may have this done every 1-3 years. Abdominal aortic aneurysm (AAA) screening. You may need this if you are a current or former smoker. Osteoporosis. You may be screened starting at age 81 if you are at high risk. Talk with your health care provider about your test results, treatment options, and if necessary, the need for more tests. Vaccines  Your health care provider may recommend certain vaccines, such as: Influenza vaccine. This is recommended every year. Tetanus, diphtheria, and acellular pertussis (Tdap, Td) vaccine. You may need a Td booster every 10 years. Zoster vaccine. You may need this after age 81. Pneumococcal 13-valent conjugate (PCV13) vaccine. One dose is recommended after age 81. Pneumococcal polysaccharide (PPSV23) vaccine. One dose is recommended after age 81. Talk to your health care provider about which screenings and vaccines you need and how often you need them. This information is not intended to replace advice given to you by your health  care provider. Make sure you discuss any questions you have with your health care provider. Document  Released: 09/24/2015 Document Revised: 05/17/2016 Document Reviewed: 06/29/2015 Elsevier Interactive Patient Education  2017 [employer].  Fall Prevention in the Home Falls can cause injuries. They can happen to people of all ages. There are many things you can do to make your home safe and to help prevent falls. What can I do on the outside of my home? Regularly fix the edges of walkways and driveways and fix any cracks. Remove anything that might make you trip as you walk through a door, such as a raised step or threshold. Trim any bushes or trees on the path to your home. Use bright outdoor lighting. Clear any walking paths of anything that might make someone trip, such as rocks or tools. Regularly check to see if handrails are loose or broken. Make sure that both sides of any steps have handrails. Any raised decks and porches should have guardrails on the edges. Have any leaves, snow, or ice cleared regularly. Use sand or salt on walking paths during winter. Clean up any spills in your garage right away. This includes oil or grease spills. What can I do in the bathroom? Use night lights. Install grab bars by the toilet and in the tub and shower. Do not use towel bars as grab bars. Use non-skid mats or decals in the tub or shower. If you need to sit down in the shower, use a plastic, non-slip stool. Keep the floor dry. Clean up any water  that spills on the floor as soon as it happens. Remove soap buildup in the tub or shower regularly. Attach bath mats securely with double-sided non-slip rug tape. Do not have throw rugs and other things on the floor that can make you trip. What can I do in the bedroom? Use night lights. Make sure that you have a light by your bed that is easy to reach. Do not use any sheets or blankets that are too big for your bed. They should not hang down onto the floor. Have a firm chair that has side arms. You can use this for support while you get dressed. Do  not have throw rugs and other things on the floor that can make you trip. What can I do in the kitchen? Clean up any spills right away. Avoid walking on wet floors. Keep items that you use a lot in easy-to-reach places. If you need to reach something above you, use a strong step stool that has a grab bar. Keep electrical cords out of the way. Do not use floor polish or wax that makes floors slippery. If you must use wax, use non-skid floor wax. Do not have throw rugs and other things on the floor that can make you trip. What can I do with my stairs? Do not leave any items on the stairs. Make sure that there are handrails on both sides of the stairs and use them. Fix handrails that are broken or loose. Make sure that handrails are as long as the stairways. Check any carpeting to make sure that it is firmly attached to the stairs. Fix any carpet that is loose or worn. Avoid having throw rugs at the top or bottom of the stairs. If you do have throw rugs, attach them to the floor with carpet tape. Make sure that you have a light switch at the top of the stairs and the bottom of the stairs. If you do not  have them, ask someone to add them for you. What else can I do to help prevent falls? Wear shoes that: Do not have high heels. Have rubber bottoms. Are comfortable and fit you well. Are closed at the toe. Do not wear sandals. If you use a stepladder: Make sure that it is fully opened. Do not climb a closed stepladder. Make sure that both sides of the stepladder are locked into place. Ask someone to hold it for you, if possible. Clearly mark and make sure that you can see: Any grab bars or handrails. First and last steps. Where the edge of each step is. Use tools that help you move around (mobility aids) if they are needed. These include: Canes. Walkers. Scooters. Crutches. Turn on the lights when you go into a dark area. Replace any light bulbs as soon as they burn out. Set up your  furniture so you have a clear path. Avoid moving your furniture around. If any of your floors are uneven, fix them. If there are any pets around you, be aware of where they are. Review your medicines with your doctor. Some medicines can make you feel dizzy. This can increase your chance of falling. Ask your doctor what other things that you can do to help prevent falls. This information is not intended to replace advice given to you by your health care provider. Make sure you discuss any questions you have with your health care provider. Document Released: 06/24/2009 Document Revised: 02/03/2016 Document Reviewed: 10/02/2014 Elsevier Interactive Patient Education  2017 [employer].

## 2024-02-27 NOTE — Progress Notes (Signed)
 Subjective:   Jeffrey [name] is a 83 y.o. male who presents for Medicare Annual/Subsequent preventive examination.  Visit Complete: Virtual I connected with  Jeffrey [name] on 02/27/24 by a audio enabled telemedicine application and verified that I am speaking with the correct person using two identifiers.  Patient Location: Home  Provider Location: Home Office  I discussed the limitations of evaluation and management by telemedicine. The patient expressed understanding and agreed to proceed.  Vital Signs: Because this visit was a virtual/telehealth visit, some criteria may be missing or patient reported. Any vitals not documented were not able to be obtained and vitals that have been documented are patient reported.        Objective:    There were no vitals filed for this visit. There is no height or weight on file to calculate BMI.     02/27/2024    3:47 PM 12/06/2022    8:22 AM 11/21/2021   10:02 AM 11/02/2021    8:18 AM 10/27/2020    9:47 AM 05/16/2019    5:03 PM 11/21/2018    8:56 AM  Advanced Directives  Does Patient Have a Medical Advance Directive? No No No No No No No   Would patient like information on creating a medical advance directive? No - Patient declined No - Patient declined  No - Patient declined No - Patient declined No - Guardian declined No - Patient declined      Data saved with a previous flowsheet row definition    Current Medications (verified) Outpatient Encounter Medications as of 02/27/2024  Medication Sig   aspirin EC 81 MG tablet Take 1 tablet (81 mg total) by mouth at bedtime.   atorvastatin  (LIPITOR) 80 MG tablet TAKE 1 TABLET(80 MG) BY MOUTH DAILY   cyanocobalamin  (,VITAMIN B-12,) 1000 MCG/ML injection Inject 1,000 mcg into the muscle every 30 (thirty) days.    ezetimibe  (ZETIA ) 10 MG tablet TAKE 1 TABLET(10 MG) BY MOUTH DAILY   Omega-3 Fatty Acids (OMEGA-3 FISH OIL PO) Take by mouth.   omeprazole  (PRILOSEC) 40 MG capsule TAKE 1 CAPSULE(40 MG)  BY MOUTH DAILY   Probiotic Product (PROBIOTIC-10 PO)    No facility-administered encounter medications on file as of 02/27/2024.    Allergies (verified) Metoprolol , Other, and Clonidine  derivatives   History: Past Medical History:  Diagnosis Date   Adenomatous colon polyp    Dr. Homero Luster (28 polyps on 1st endo, 3 on 2nd endo a year later.  8 on colonoscopy 02/26/14--recall 3 yrs.  Multiple adenomatous polyps on 09/20/17 TCS--recall 1 yr.. 11/2018 +aden pol->recall 3 yrs.   Aortic insufficiency    Mild to moderate 2013   BPH (benign prostatic hyperplasia)    Dr. Harriette Limerick (pt is asymptomatic)   Carotid artery disease (HCC)    Left CEA   Chronic renal insufficiency, stage 3 (moderate) (HCC)    DDD (degenerative disc disease), lumbar 02/2013   with spondylosis and moderate spinal stenosis+ left L5 and S1 nerve root impingement.  04/2022 left S1 root impingement   Ectatic abdominal aorta (HCC) 08/08/2016   2.8 cm at widest point: repeat aortic u/s 08/2021.   Essential tremor    Fatty liver 2006   Noted on abd u/s and renal u/s 2006/2007   Frequent PVCs    no meds as of 04/2022 per cardiology   History of hyperkalemia 02/2017   mild; recommended pt cut his ARB in half.   History of MI (myocardial infarction)    Large fixed  inferior defect on myocardial perfusion imaging 03/2017.   Hyperlipidemia    Dr. Lavonne Prairie started statin 03/2017.     Hypertension    PAD (peripheral artery disease) (HCC)    carotids: carotid endarterectomy 2010.  Dr. Lavonne Prairie started pt on statin for CV risk reduction 03/2017   Plantar wart of left foot 12/21/2012   Prediabetes 2019   A1c 5.9%   Solitary pulmonary nodule 03/14/2013   7mm RUL 03/08/13--stable on repeat 07/2013, 03/2015, and 01/2016.  No additional imaging is required.   Tobacco dependence    ongoing as of 02/2016   Vitamin B12 deficiency without anemia 2016   Intrinsic factor NEG: home vit B12 injections started 11/17/14   Past Surgical History:   Procedure Laterality Date   AMPUTATION FINGER Left 05/16/2019   Procedure: Revision Amutation of Left  fourth finger;  Surgeon: Ronn Cohn, MD;  Location: [facility] OR;  Service: Orthopedics;  Laterality: Left;   APPENDECTOMY  remote   Arm surgery  03/2009   Left arm cyst/Lipoma   CARDIOVASCULAR STRESS TEST  03/2017   Low risk study (likely artifact seen, but no reversible ischemia, EF 59%, no wall motion abnormality.   carotid dopplers  02/2015; 02/2016;12/2017;06/2019   2017:  R ICA 1-39%, L ICA 40-59%: no change from 2016. 03/2017 rpt 40-59% bilat ICA stenosis. 12/2017 1-39% bilat ICA stenosis.  06/2019->same. Rpt 18 mo.   CAROTID ENDARTERECTOMY  11/25/2008   Left     ICA   COLONOSCOPY N/A 02/26/2014   Recall 3 yrs: (+ polypectomy--tubular adenoma) Procedure: COLONOSCOPY;  Surgeon: Ruby Corporal, MD;  [facility]: [facility];  Service: Endoscopy;  Laterality: N/A;  830   COLONOSCOPY N/A 09/20/2017   Multiple adenomatous polyps: recall 1 yr.  Procedure: COLONOSCOPY;  Surgeon: Ruby Corporal, MD;  [facility]: [facility];  Service: Endoscopy;  Laterality: N/A;  730   COLONOSCOPY N/A 11/21/2018   Procedure: COLONOSCOPY;  Surgeon: Ruby Corporal, MD;  [facility];  Service: Endoscopy;  Laterality: N/A;  930   COLONOSCOPY W/ POLYPECTOMY  09/20/2017; 11/21/18   2019 Adenomatous polyp: recall 3-5 yrs (Dr. Homero Luster).  11/2018-adenomatous polyp->recall 3 yrs   I & D EXTREMITY Left 05/16/2019   Procedure: IRRIGATION AND DEBRIDEMENT OF LEFT INDEX, MIDDLE,FOURTH FINGERS;  Surgeon: Ronn Cohn, MD;  Location: [facility];  Service: Orthopedics;  Laterality: Left;   NAILBED REPAIR Left 05/16/2019   Procedure: Reconstruction of bone and nail bed Left Index finger;  Surgeon: Ronn Cohn, MD;  Location: [facility];  Service: Orthopedics;  Laterality: Left;   OPEN REDUCTION INTERNAL FIXATION (ORIF) HAND Left 05/16/2019   Procedure: Reconstruction and Open Reduction and Internal fixation and Rotation  flap Left Middle finger;  Surgeon: Ronn Cohn, MD;  Location: [facility];  Service: Orthopedics;  Laterality: Left;   POLYPECTOMY  09/20/2017   Procedure: POLYPECTOMY;  Surgeon: Ruby Corporal, MD;  [facility];  Service: Endoscopy;;  Ascending colon, Transverse colon, Hepatic flexure, Cecum   POLYPECTOMY  11/21/2018   Procedure: POLYPECTOMY;  Surgeon: Ruby Corporal, MD;  [facility];  Service: Endoscopy;;   TONSILLECTOMY  remote   TONSILLECTOMY     TRANSTHORACIC ECHOCARDIOGRAM  03/2012   2013 --EF 55%, normal LV syst fxn, impaired diast relaxation, mild/mod aortic regurg.   UMBILICAL HERNIA REPAIR     ZIO PATCH  04/2022   mild brady, PACs, PVCs.  No meds   Family History  Problem Relation Age of Onset  Coronary artery disease Mother    Stroke Mother    Diabetes Mother    Hypertension Mother    Parkinson's disease Mother    Stroke Sister    Cancer Brother    Heart disease Brother    Colon cancer Neg Hx    Social History   Socioeconomic History   Marital status: Married    Spouse name: Not on file   Number of children: Not on file   Years of education: Not on file   Highest education level: 9th grade  Occupational History   Occupation: retired    Comment: state [employer] de[employer]  Tobacco Use   Smoking status: Former    Current packs/day: 0.00    Types: Cigarettes    Quit date: 08/2023    Years since quitting: 0.5   Smokeless tobacco: Never  Vaping Use   Vaping status: Never Used  Substance and Sexual Activity   Alcohol use: No    Alcohol/week: 0.0 standard drinks of alcohol   Drug use: No   Sexual activity: Not on file  Other Topics Concern   Not on file  Social History Narrative   Married, 2 children.   Orig from [place].   Retired from [employer].   Current smoker; 1 ppd (x 50 yrs).  No hx alc, no drugs.   Active person, no formal exercise.   Social Drivers of Corporate investment banker Strain: Low Risk  (02/27/2024)    Overall Financial Resource Strain (CARDIA)    Difficulty of Paying Living Expenses: Not hard at all  Food Insecurity: No Food Insecurity (02/27/2024)   Hunger Vital Sign    Worried About Running Out of Food in the Last Year: Never true    Ran Out of Food in the Last Year: Never true  Transportation Needs: No Transportation Needs (02/27/2024)   PRAPARE - Administrator, Civil Service (Medical): No    Lack of Transportation (Non-Medical): No  Physical Activity: Inactive (02/27/2024)   Exercise Vital Sign    Days of Exercise per Week: 0 days    Minutes of Exercise per Session: 0 min  Stress: No Stress Concern Present (02/27/2024)   [name] of Occupational Health - Occupational Stress Questionnaire    Feeling of Stress: Not at all  Social Connections: Moderately Integrated (02/27/2024)   Social Connection and Isolation Panel    Frequency of Communication with Friends and Family: Once a week    Frequency of Social Gatherings with Friends and Family: Twice a week    Attends Religious Services: More than 4 times per year    Active Member of [employer] or Organizations: No    Attends Banker Meetings: Never    Marital Status: Married  Recent Concern: Social Connections - Socially Isolated (01/14/2024)   Social Connection and Isolation Panel    Frequency of Communication with Friends and Family: Never    Frequency of Social Gatherings with Friends and Family: Once a week    Attends Religious Services: Never    Database administrator or Organizations: No    Attends Engineer, structural: Not on file    Marital Status: Married    Tobacco Counseling Counseling given: Not Answered   Clinical Intake:  Pre-visit preparation completed: Yes  Pain : No/denies pain     Diabetes: No  How often do you need to have someone help you when you read instructions, pamphlets, or other written materials from your doctor  or pharmacy?: 1 - Never  Interpreter  Needed?: No  Information entered by :: Kieth Pelt  LPN   Activities of Daily Living    02/27/2024    4:02 PM  In your present state of health, do you have any difficulty performing the following activities:  Hearing? 1  Vision? 0  Difficulty concentrating or making decisions? 1  Walking or climbing stairs? 1  Dressing or bathing? 0  Doing errands, shopping? 0  Preparing Food and eating ? N  Using the Toilet? N  In the past six months, have you accidently leaked urine? N  Do you have problems with loss of bowel control? N  Managing your Medications? N  Managing your Finances? N  Housekeeping or managing your Housekeeping? N    Patient Care Team: Shelvia Dick, MD as PCP - General (Family Medicine) Gerard Knight, MD as PCP - Cardiology (Cardiology) Ruby Corporal, MD (Inactive) as Consulting Physician (Gastroenterology) Nickel, Azell Boll, NP (Inactive) as Nurse Practitioner (Vascular Surgery) Eilleen Grates, MD as Consulting Physician (Cardiology) Gerard Knight, MD as Consulting Physician (Cardiology) Margaree Shark, MD as Consulting Physician (Family Medicine)  Indicate any recent Medical Services you may have received from other than [facility] providers in the past year (date may be approximate).     Assessment:   This is a routine wellness examination for [place].  Hearing/Vision screen Hearing Screening - Comments:: Bilateral hearing aids Vision Screening - Comments:: Up to date [facility]   Goals Addressed             This Visit's Progress    Patient Stated   On track    Maintain current health.      Patient Stated   On track    None at this time      Patient Stated   On track    Continue to not smoke      Patient Stated       Stay healthy       Depression Screen    02/27/2024    3:42 PM 01/16/2024    1:05 PM 12/06/2022    8:20 AM 11/02/2021    8:17 AM 10/27/2020    9:49 AM 04/21/2020    9:58 AM 09/26/2018    8:19 AM  PHQ 2/9 Scores   PHQ - 2 Score 3 0 0 0 0 0 0  PHQ- 9 Score 6          Fall Risk    02/27/2024    4:03 PM 01/16/2024    1:04 PM 12/06/2022    8:23 AM 11/21/2021   10:02 AM 11/02/2021    8:19 AM  Fall Risk   Falls in the past year? 0 0 1 0 0  Number falls in past yr: 0 0 1 0 0  Injury with Fall? 0 0 0 0 0  Risk for fall due to : Impaired mobility No Fall Risks Impaired vision  Impaired vision  Follow up Falls evaluation completed;Education provided;Falls prevention discussed Falls evaluation completed Falls prevention discussed  Falls prevention discussed      Data saved with a previous flowsheet row definition    MEDICARE RISK AT HOME: Medicare Risk at Home Any stairs in or around the home?: Yes If so, are there any without handrails?: No Home free of loose throw rugs in walkways, pet beds, electrical cords, etc?: Yes Adequate lighting in your home to reduce risk of falls?: Yes Life alert?: No Use of  a cane, walker or w/c?: No Grab bars in the bathroom?: Yes Shower chair or bench in shower?: Yes Elevated toilet seat or a handicapped toilet?: Yes  TIMED UP AND GO:  Was the test performed?  No    Cognitive Function:    03/25/2018    2:20 PM  MMSE - Mini Mental State Exam  Orientation to time 5  Orientation to Place 5  Registration 3  Attention/ Calculation 5  Recall 3  Language- name 2 objects 2  Language- repeat 1  Language- follow 3 step command 3  Language- read & follow direction 1  Write a sentence 1  Copy design 1  Total score 30        02/27/2024    3:39 PM  6CIT Screen  What Year? 4 points  What month? 3 points  What time? 0 points  Count back from 20 0 points  Months in reverse 4 points  Repeat phrase 0 points  Total Score 11 points    Immunizations Immunization History  Administered Date(s) Administered   Fluad Quad(high Dose 65+) 07/01/2019, 05/28/2020, 06/10/2021, 07/06/2022   Influenza Split 06/11/2012   Influenza, High Dose Seasonal PF 06/21/2016,  06/15/2017, 06/03/2018, 08/29/2023   Influenza,inj,Quad PF,6+ Mos 05/23/2013, 07/08/2014, 06/17/2015   PFIZER(Purple Top)SARS-COV-2 Vaccination 12/18/2019, 01/09/2020   Pneumococcal Conjugate-13 01/04/2016   Pneumococcal Polysaccharide-23 09/12/2011   Rsv, Bivalent, Protein Subunit Rsvpref,pf Pattricia Bores) 08/29/2023   Tdap 11/22/2017, 05/16/2019   Zoster Recombinant(Shingrix) 11/22/2017, 02/12/2018    TDAP status: Up to date  Flu Vaccine status: Up to date  Pneumococcal vaccine status: Up to date  Covid-19 vaccine status: Information provided on how to obtain vaccines.   Qualifies for Shingles Vaccine? No   Zostavax completed Yes   Shingrix Completed?: Yes  Screening Tests Health Maintenance  Topic Date Due   Colonoscopy  11/21/2023   COVID-19 Vaccine (3 - 2024-25 season) 03/14/2024 (Originally 05/13/2023)   INFLUENZA VACCINE  04/11/2024   Medicare Annual Wellness (AWV)  02/26/2025   DTaP/Tdap/Td (3 - Td or Tdap) 05/15/2029   Pneumococcal Vaccine: 50+ Years  Completed   Zoster Vaccines- Shingrix  Completed   HPV VACCINES  Aged Out   Meningococcal B Vaccine  Aged Out   Lung Cancer Screening  Discontinued    Health Maintenance  Health Maintenance Due  Topic Date Due   Colonoscopy  11/21/2023    Colorectal cancer screening: No longer required.   Lung Cancer Screening: (Low Dose CT Chest recommended if Age 80-80 years, 20 pack-year currently smoking OR have quit w/in 15years.) does not qualify.   Lung Cancer Screening Referral:   Additional Screening:  Hepatitis C Screening   never done  Vision Screening: Recommended annual ophthalmology exams for early detection of glaucoma and other disorders of the eye. Is the patient up to date with their annual eye exam?  Yes  Who is the provider or what is the name of the office in which the patient attends annual eye exams? [facility] If pt is not established with a provider, would they like to be referred to a provider to  establish care? No .   Dental Screening: Recommended annual dental exams for proper oral hygiene    Community Resource Referral / Chronic Care Management: CRR required this visit?  No   CCM required this visit?  No     Plan:     I have personally reviewed and noted the following in the patient's chart:   Medical and social history Use  of alcohol, tobacco or illicit drugs  Current medications and supplements including opioid prescriptions. Patient is not currently taking opioid prescriptions. Functional ability and status Nutritional status Physical activity Advanced directives List of other physicians Hospitalizations, surgeries, and ER visits in previous 12 months Vitals Screenings to include cognitive, depression, and falls Referrals and appointments  In addition, I have reviewed and discussed with patient certain preventive protocols, quality metrics, and best practice recommendations. A written personalized care plan for preventive services as well as general preventive health recommendations were provided to patient.     Kieth Pelt, LPN   12/18/8117   After Visit Summary: (MyChart) Due to this being a telephonic visit, the after visit summary with patients personalized plan was offered to patient via MyChart   Nurse Notes:

## 2024-07-22 ENCOUNTER — Other Ambulatory Visit: Payer: Self-pay | Admitting: Family Medicine

## 2024-08-19 ENCOUNTER — Telehealth: Payer: Self-pay | Admitting: Pharmacist

## 2024-08-19 NOTE — Progress Notes (Signed)
 Pharmacy Quality Measure Review  This patient is appearing on a report for being at risk of failing the adherence measure for cholesterol (statin) medications this calendar year.   Medication: atorvastatin   Last fill date: 04/12/2024 for 90 day supply  Rx was sent to his pharmacy 07/22/2024 for 30 day supply but was not picked up per [facility].  Looks like patient is due follow up with PCP. Tried to call patient to discuss that he has refill available at [facility] of atorvastatin  and to try to schedule follow up with PCP but unable to reach patient or his emergency contact - his wife or daughter.   Madelin Ray, PharmD Clinical Pharmacist [facility]  Population Health [phone]

## 2024-11-13 ENCOUNTER — Ambulatory Visit: Admitting: Family Medicine

## 2025-03-04 ENCOUNTER — Encounter
